# Patient Record
Sex: Female | Born: 1950 | Race: Black or African American | Hispanic: No | Marital: Single | Smoking: Never smoker
Health system: Southern US, Community
[De-identification: ages and names within clinical notes are randomized; demographics above are authoritative.]

## PROBLEM LIST (undated history)

## (undated) DIAGNOSIS — Z9289 Personal history of other medical treatment: Secondary | ICD-10-CM

## (undated) DIAGNOSIS — E119 Type 2 diabetes mellitus without complications: Secondary | ICD-10-CM

## (undated) DIAGNOSIS — A4902 Methicillin resistant Staphylococcus aureus infection, unspecified site: Secondary | ICD-10-CM

## (undated) DIAGNOSIS — I10 Essential (primary) hypertension: Secondary | ICD-10-CM

## (undated) DIAGNOSIS — E785 Hyperlipidemia, unspecified: Secondary | ICD-10-CM

## (undated) DIAGNOSIS — Z8619 Personal history of other infectious and parasitic diseases: Secondary | ICD-10-CM

## (undated) HISTORY — DX: Methicillin resistant Staphylococcus aureus infection, unspecified site: A49.02

## (undated) HISTORY — DX: Personal history of other infectious and parasitic diseases: Z86.19

## (undated) HISTORY — DX: Essential (primary) hypertension: I10

## (undated) HISTORY — DX: Hyperlipidemia, unspecified: E78.5

---

## 1989-01-17 DIAGNOSIS — Z9289 Personal history of other medical treatment: Secondary | ICD-10-CM

## 1989-01-17 HISTORY — DX: Personal history of other medical treatment: Z92.89

## 1989-01-17 HISTORY — PX: LEFT OOPHORECTOMY: SHX1961

## 1989-01-17 HISTORY — PX: ABDOMINAL HYSTERECTOMY: SHX81

## 1998-11-06 ENCOUNTER — Encounter: Admission: RE | Admit: 1998-11-06 | Discharge: 1998-11-06 | Payer: Self-pay | Admitting: Obstetrics and Gynecology

## 1998-11-06 ENCOUNTER — Encounter: Payer: Self-pay | Admitting: Obstetrics and Gynecology

## 2000-02-16 ENCOUNTER — Emergency Department (HOSPITAL_COMMUNITY): Admission: EM | Admit: 2000-02-16 | Discharge: 2000-02-16 | Payer: Self-pay | Admitting: Emergency Medicine

## 2000-02-22 ENCOUNTER — Encounter: Admission: RE | Admit: 2000-02-22 | Discharge: 2000-02-22 | Payer: Self-pay | Admitting: Internal Medicine

## 2000-02-24 ENCOUNTER — Encounter: Admission: RE | Admit: 2000-02-24 | Discharge: 2000-05-24 | Payer: Self-pay

## 2000-03-08 ENCOUNTER — Encounter: Admission: RE | Admit: 2000-03-08 | Discharge: 2000-03-08 | Payer: Self-pay | Admitting: Internal Medicine

## 2000-03-17 ENCOUNTER — Encounter: Admission: RE | Admit: 2000-03-17 | Discharge: 2000-03-17 | Payer: Self-pay | Admitting: Internal Medicine

## 2000-05-22 ENCOUNTER — Encounter: Admission: RE | Admit: 2000-05-22 | Discharge: 2000-05-22 | Payer: Self-pay | Admitting: Internal Medicine

## 2000-09-06 ENCOUNTER — Encounter: Admission: RE | Admit: 2000-09-06 | Discharge: 2000-09-06 | Payer: Self-pay | Admitting: Internal Medicine

## 2000-09-08 ENCOUNTER — Encounter: Admission: RE | Admit: 2000-09-08 | Discharge: 2000-09-08 | Payer: Self-pay | Admitting: Internal Medicine

## 2000-09-08 ENCOUNTER — Encounter: Payer: Self-pay | Admitting: Internal Medicine

## 2000-12-26 ENCOUNTER — Encounter: Admission: RE | Admit: 2000-12-26 | Discharge: 2000-12-26 | Payer: Self-pay | Admitting: Internal Medicine

## 2001-07-26 ENCOUNTER — Encounter: Admission: RE | Admit: 2001-07-26 | Discharge: 2001-07-26 | Payer: Self-pay | Admitting: Internal Medicine

## 2002-03-11 ENCOUNTER — Encounter: Admission: RE | Admit: 2002-03-11 | Discharge: 2002-03-11 | Payer: Self-pay | Admitting: Internal Medicine

## 2002-04-11 ENCOUNTER — Emergency Department (HOSPITAL_COMMUNITY): Admission: EM | Admit: 2002-04-11 | Discharge: 2002-04-11 | Payer: Self-pay | Admitting: Emergency Medicine

## 2002-10-31 ENCOUNTER — Encounter: Admission: RE | Admit: 2002-10-31 | Discharge: 2002-10-31 | Payer: Self-pay | Admitting: Internal Medicine

## 2003-05-20 ENCOUNTER — Encounter: Admission: RE | Admit: 2003-05-20 | Discharge: 2003-05-20 | Payer: Self-pay | Admitting: Internal Medicine

## 2003-05-22 ENCOUNTER — Encounter: Admission: RE | Admit: 2003-05-22 | Discharge: 2003-05-22 | Payer: Self-pay | Admitting: Internal Medicine

## 2003-07-24 ENCOUNTER — Encounter: Admission: RE | Admit: 2003-07-24 | Discharge: 2003-07-24 | Payer: Self-pay | Admitting: Internal Medicine

## 2003-07-31 ENCOUNTER — Encounter: Admission: RE | Admit: 2003-07-31 | Discharge: 2003-07-31 | Payer: Self-pay | Admitting: Internal Medicine

## 2003-08-07 ENCOUNTER — Encounter: Admission: RE | Admit: 2003-08-07 | Discharge: 2003-08-07 | Payer: Self-pay | Admitting: Internal Medicine

## 2003-09-08 ENCOUNTER — Encounter: Admission: RE | Admit: 2003-09-08 | Discharge: 2003-09-08 | Payer: Self-pay | Admitting: Internal Medicine

## 2003-09-23 ENCOUNTER — Ambulatory Visit: Payer: Self-pay | Admitting: Internal Medicine

## 2003-09-30 ENCOUNTER — Ambulatory Visit: Payer: Self-pay | Admitting: Internal Medicine

## 2003-10-30 ENCOUNTER — Ambulatory Visit: Payer: Self-pay | Admitting: Internal Medicine

## 2004-02-03 ENCOUNTER — Ambulatory Visit: Payer: Self-pay | Admitting: Internal Medicine

## 2004-04-08 ENCOUNTER — Inpatient Hospital Stay (HOSPITAL_COMMUNITY): Admission: RE | Admit: 2004-04-08 | Discharge: 2004-04-09 | Payer: Self-pay | Admitting: Internal Medicine

## 2004-04-08 ENCOUNTER — Ambulatory Visit: Payer: Self-pay | Admitting: Internal Medicine

## 2004-04-17 HISTORY — PX: INCISION AND DRAINAGE ABSCESS ANAL: SUR669

## 2004-05-11 ENCOUNTER — Ambulatory Visit: Payer: Self-pay | Admitting: Internal Medicine

## 2004-05-13 ENCOUNTER — Ambulatory Visit: Payer: Self-pay | Admitting: Internal Medicine

## 2004-05-20 ENCOUNTER — Ambulatory Visit: Payer: Self-pay | Admitting: Internal Medicine

## 2004-05-27 ENCOUNTER — Ambulatory Visit: Payer: Self-pay | Admitting: Internal Medicine

## 2004-07-28 ENCOUNTER — Ambulatory Visit: Payer: Self-pay | Admitting: Internal Medicine

## 2004-08-04 ENCOUNTER — Ambulatory Visit: Payer: Self-pay | Admitting: Internal Medicine

## 2005-08-25 ENCOUNTER — Ambulatory Visit: Payer: Self-pay | Admitting: Internal Medicine

## 2005-09-05 ENCOUNTER — Ambulatory Visit: Payer: Self-pay | Admitting: Internal Medicine

## 2005-09-07 ENCOUNTER — Emergency Department (HOSPITAL_COMMUNITY): Admission: EM | Admit: 2005-09-07 | Discharge: 2005-09-08 | Payer: Self-pay | Admitting: Emergency Medicine

## 2005-12-20 DIAGNOSIS — E1165 Type 2 diabetes mellitus with hyperglycemia: Secondary | ICD-10-CM

## 2005-12-20 DIAGNOSIS — IMO0002 Reserved for concepts with insufficient information to code with codable children: Secondary | ICD-10-CM | POA: Insufficient documentation

## 2005-12-20 DIAGNOSIS — Z9079 Acquired absence of other genital organ(s): Secondary | ICD-10-CM | POA: Insufficient documentation

## 2005-12-20 DIAGNOSIS — E119 Type 2 diabetes mellitus without complications: Secondary | ICD-10-CM | POA: Insufficient documentation

## 2006-01-13 DIAGNOSIS — E785 Hyperlipidemia, unspecified: Secondary | ICD-10-CM | POA: Insufficient documentation

## 2006-01-13 DIAGNOSIS — B957 Other staphylococcus as the cause of diseases classified elsewhere: Secondary | ICD-10-CM | POA: Insufficient documentation

## 2006-02-14 ENCOUNTER — Telehealth (INDEPENDENT_AMBULATORY_CARE_PROVIDER_SITE_OTHER): Payer: Self-pay | Admitting: Pharmacy Technician

## 2006-03-15 ENCOUNTER — Ambulatory Visit: Payer: Self-pay | Admitting: Internal Medicine

## 2006-03-15 LAB — CONVERTED CEMR LAB
Glucose, Bld: 206 mg/dL
Hgb A1c MFr Bld: 10.1 %

## 2006-03-16 ENCOUNTER — Telehealth (INDEPENDENT_AMBULATORY_CARE_PROVIDER_SITE_OTHER): Payer: Self-pay | Admitting: *Deleted

## 2006-03-23 ENCOUNTER — Ambulatory Visit: Payer: Self-pay | Admitting: Internal Medicine

## 2006-03-23 ENCOUNTER — Telehealth (INDEPENDENT_AMBULATORY_CARE_PROVIDER_SITE_OTHER): Payer: Self-pay | Admitting: *Deleted

## 2006-05-22 ENCOUNTER — Telehealth (INDEPENDENT_AMBULATORY_CARE_PROVIDER_SITE_OTHER): Payer: Self-pay | Admitting: Pharmacy Technician

## 2006-05-29 ENCOUNTER — Ambulatory Visit: Payer: Self-pay | Admitting: Internal Medicine

## 2006-05-29 LAB — CONVERTED CEMR LAB
Blood Glucose, Fingerstick: 116
Cholesterol, target level: 200 mg/dL
HDL goal, serum: 40 mg/dL
LDL Goal: 100 mg/dL

## 2006-06-28 ENCOUNTER — Encounter (INDEPENDENT_AMBULATORY_CARE_PROVIDER_SITE_OTHER): Payer: Self-pay | Admitting: *Deleted

## 2006-07-17 ENCOUNTER — Telehealth (INDEPENDENT_AMBULATORY_CARE_PROVIDER_SITE_OTHER): Payer: Self-pay | Admitting: Pharmacy Technician

## 2006-07-25 ENCOUNTER — Encounter (INDEPENDENT_AMBULATORY_CARE_PROVIDER_SITE_OTHER): Payer: Self-pay | Admitting: Infectious Diseases

## 2006-08-05 ENCOUNTER — Emergency Department (HOSPITAL_COMMUNITY): Admission: EM | Admit: 2006-08-05 | Discharge: 2006-08-05 | Payer: Self-pay | Admitting: Emergency Medicine

## 2006-08-06 ENCOUNTER — Emergency Department (HOSPITAL_COMMUNITY): Admission: EM | Admit: 2006-08-06 | Discharge: 2006-08-06 | Payer: Self-pay | Admitting: Emergency Medicine

## 2006-08-09 ENCOUNTER — Telehealth: Payer: Self-pay | Admitting: *Deleted

## 2006-08-09 ENCOUNTER — Ambulatory Visit: Payer: Self-pay | Admitting: Hospitalist

## 2006-08-09 LAB — CONVERTED CEMR LAB: Blood Glucose, Fingerstick: 174

## 2006-08-10 ENCOUNTER — Encounter (INDEPENDENT_AMBULATORY_CARE_PROVIDER_SITE_OTHER): Payer: Self-pay | Admitting: Infectious Diseases

## 2006-08-10 LAB — CONVERTED CEMR LAB
ALT: 26 units/L (ref 0–35)
Albumin: 4.1 g/dL (ref 3.5–5.2)
Alkaline Phosphatase: 105 units/L (ref 39–117)
CO2: 23 meq/L (ref 19–32)
Glucose, Bld: 149 mg/dL — ABNORMAL HIGH (ref 70–99)
LDL Cholesterol: 105 mg/dL — ABNORMAL HIGH (ref 0–99)
Microalb Creat Ratio: 2 mg/g (ref 0.0–30.0)
Microalb, Ur: 0.44 mg/dL (ref 0.00–1.89)
Potassium: 4.5 meq/L (ref 3.5–5.3)
Sodium: 139 meq/L (ref 135–145)
Total Bilirubin: 0.2 mg/dL — ABNORMAL LOW (ref 0.3–1.2)
Total Protein: 7.1 g/dL (ref 6.0–8.3)
Triglycerides: 167 mg/dL — ABNORMAL HIGH (ref <150)
VLDL: 33 mg/dL (ref 0–40)

## 2006-08-16 LAB — CONVERTED CEMR LAB: OCCULT 3: NEGATIVE

## 2006-08-17 LAB — CONVERTED CEMR LAB: OCCULT 2: NEGATIVE

## 2006-08-23 ENCOUNTER — Encounter (INDEPENDENT_AMBULATORY_CARE_PROVIDER_SITE_OTHER): Payer: Self-pay | Admitting: Infectious Diseases

## 2006-08-24 ENCOUNTER — Ambulatory Visit: Payer: Self-pay | Admitting: Infectious Disease

## 2006-08-30 ENCOUNTER — Telehealth (INDEPENDENT_AMBULATORY_CARE_PROVIDER_SITE_OTHER): Payer: Self-pay | Admitting: Pharmacy Technician

## 2006-09-11 ENCOUNTER — Telehealth (INDEPENDENT_AMBULATORY_CARE_PROVIDER_SITE_OTHER): Payer: Self-pay | Admitting: Pharmacy Technician

## 2006-09-22 ENCOUNTER — Telehealth (INDEPENDENT_AMBULATORY_CARE_PROVIDER_SITE_OTHER): Payer: Self-pay | Admitting: Pharmacy Technician

## 2006-10-11 ENCOUNTER — Telehealth (INDEPENDENT_AMBULATORY_CARE_PROVIDER_SITE_OTHER): Payer: Self-pay | Admitting: *Deleted

## 2006-12-11 ENCOUNTER — Encounter (INDEPENDENT_AMBULATORY_CARE_PROVIDER_SITE_OTHER): Payer: Self-pay | Admitting: Infectious Diseases

## 2006-12-11 ENCOUNTER — Ambulatory Visit: Payer: Self-pay | Admitting: Internal Medicine

## 2006-12-11 ENCOUNTER — Encounter (INDEPENDENT_AMBULATORY_CARE_PROVIDER_SITE_OTHER): Payer: Self-pay | Admitting: Internal Medicine

## 2006-12-11 LAB — CONVERTED CEMR LAB
Cholesterol: 160 mg/dL (ref 0–200)
LDL Cholesterol: 91 mg/dL (ref 0–99)

## 2007-03-02 ENCOUNTER — Telehealth (INDEPENDENT_AMBULATORY_CARE_PROVIDER_SITE_OTHER): Payer: Self-pay | Admitting: Infectious Diseases

## 2007-03-07 ENCOUNTER — Telehealth (INDEPENDENT_AMBULATORY_CARE_PROVIDER_SITE_OTHER): Payer: Self-pay | Admitting: Infectious Diseases

## 2007-05-14 ENCOUNTER — Telehealth (INDEPENDENT_AMBULATORY_CARE_PROVIDER_SITE_OTHER): Payer: Self-pay | Admitting: Infectious Diseases

## 2007-06-15 ENCOUNTER — Encounter (INDEPENDENT_AMBULATORY_CARE_PROVIDER_SITE_OTHER): Payer: Self-pay | Admitting: Infectious Diseases

## 2007-06-15 ENCOUNTER — Ambulatory Visit: Payer: Self-pay | Admitting: *Deleted

## 2007-06-15 LAB — CONVERTED CEMR LAB
Albumin: 4.1 g/dL (ref 3.5–5.2)
BUN: 25 mg/dL — ABNORMAL HIGH (ref 6–23)
Calcium: 9.7 mg/dL (ref 8.4–10.5)
Chloride: 103 meq/L (ref 96–112)
Glucose, Bld: 214 mg/dL — ABNORMAL HIGH (ref 70–99)
Potassium: 4.1 meq/L (ref 3.5–5.3)

## 2007-06-28 ENCOUNTER — Encounter: Payer: Self-pay | Admitting: Internal Medicine

## 2007-06-28 ENCOUNTER — Ambulatory Visit: Payer: Self-pay | Admitting: Internal Medicine

## 2007-06-28 LAB — CONVERTED CEMR LAB
Blood Glucose, Fingerstick: 105
Insulin/Carbohydrate Ratio: 1

## 2007-06-29 ENCOUNTER — Encounter: Payer: Self-pay | Admitting: Internal Medicine

## 2007-06-29 ENCOUNTER — Ambulatory Visit: Payer: Self-pay | Admitting: Internal Medicine

## 2007-08-06 ENCOUNTER — Telehealth (INDEPENDENT_AMBULATORY_CARE_PROVIDER_SITE_OTHER): Payer: Self-pay | Admitting: Pharmacy Technician

## 2007-09-07 ENCOUNTER — Telehealth: Payer: Self-pay | Admitting: Internal Medicine

## 2007-09-18 ENCOUNTER — Telehealth (INDEPENDENT_AMBULATORY_CARE_PROVIDER_SITE_OTHER): Payer: Self-pay | Admitting: Pharmacy Technician

## 2007-09-25 ENCOUNTER — Telehealth: Payer: Self-pay | Admitting: Internal Medicine

## 2007-12-06 ENCOUNTER — Telehealth: Payer: Self-pay | Admitting: Internal Medicine

## 2007-12-17 ENCOUNTER — Ambulatory Visit: Payer: Self-pay | Admitting: Infectious Diseases

## 2007-12-17 ENCOUNTER — Encounter (INDEPENDENT_AMBULATORY_CARE_PROVIDER_SITE_OTHER): Payer: Self-pay | Admitting: *Deleted

## 2007-12-17 DIAGNOSIS — I10 Essential (primary) hypertension: Secondary | ICD-10-CM

## 2007-12-17 DIAGNOSIS — I152 Hypertension secondary to endocrine disorders: Secondary | ICD-10-CM | POA: Insufficient documentation

## 2007-12-17 DIAGNOSIS — E1159 Type 2 diabetes mellitus with other circulatory complications: Secondary | ICD-10-CM

## 2007-12-17 LAB — CONVERTED CEMR LAB
Albumin: 3.7 g/dL (ref 3.5–5.2)
Alkaline Phosphatase: 96 units/L (ref 39–117)
BUN: 18 mg/dL (ref 6–23)
CO2: 24 meq/L (ref 19–32)
Cholesterol: 165 mg/dL (ref 0–200)
Glucose, Bld: 297 mg/dL — ABNORMAL HIGH (ref 70–99)
HDL: 43 mg/dL (ref >39)
LDL Cholesterol: 90 mg/dL (ref 0–99)
Potassium: 4.3 meq/L (ref 3.5–5.3)
Total Bilirubin: 0.3 mg/dL (ref 0.3–1.2)
Triglycerides: 162 mg/dL — ABNORMAL HIGH (ref <150)

## 2008-02-07 ENCOUNTER — Ambulatory Visit: Payer: Self-pay | Admitting: Internal Medicine

## 2008-02-07 ENCOUNTER — Encounter: Payer: Self-pay | Admitting: Internal Medicine

## 2008-02-20 ENCOUNTER — Telehealth: Payer: Self-pay | Admitting: Internal Medicine

## 2008-04-29 ENCOUNTER — Telehealth: Payer: Self-pay | Admitting: Internal Medicine

## 2008-06-09 ENCOUNTER — Encounter: Payer: Self-pay | Admitting: Internal Medicine

## 2008-06-09 ENCOUNTER — Ambulatory Visit: Payer: Self-pay | Admitting: *Deleted

## 2008-06-10 ENCOUNTER — Encounter: Payer: Self-pay | Admitting: Internal Medicine

## 2008-06-13 ENCOUNTER — Telehealth (INDEPENDENT_AMBULATORY_CARE_PROVIDER_SITE_OTHER): Payer: Self-pay | Admitting: Pharmacy Technician

## 2008-09-02 ENCOUNTER — Telehealth: Payer: Self-pay | Admitting: Internal Medicine

## 2008-11-12 ENCOUNTER — Telehealth: Payer: Self-pay | Admitting: Internal Medicine

## 2008-11-17 ENCOUNTER — Encounter: Payer: Self-pay | Admitting: Internal Medicine

## 2008-11-18 ENCOUNTER — Ambulatory Visit: Payer: Self-pay | Admitting: Infectious Disease

## 2008-11-18 DIAGNOSIS — S025XXA Fracture of tooth (traumatic), initial encounter for closed fracture: Secondary | ICD-10-CM | POA: Insufficient documentation

## 2008-11-18 LAB — CONVERTED CEMR LAB: Hgb A1c MFr Bld: 10.5 %

## 2009-01-19 ENCOUNTER — Telehealth (INDEPENDENT_AMBULATORY_CARE_PROVIDER_SITE_OTHER): Payer: Self-pay | Admitting: *Deleted

## 2009-01-19 ENCOUNTER — Telehealth: Payer: Self-pay | Admitting: Internal Medicine

## 2009-01-21 ENCOUNTER — Ambulatory Visit: Payer: Self-pay | Admitting: Internal Medicine

## 2009-01-22 ENCOUNTER — Encounter (INDEPENDENT_AMBULATORY_CARE_PROVIDER_SITE_OTHER): Payer: Self-pay | Admitting: Dermatology

## 2009-01-22 LAB — CONVERTED CEMR LAB
ALT: 13 units/L (ref 0–35)
AST: 13 units/L (ref 0–37)
Albumin: 4.2 g/dL (ref 3.5–5.2)
Alkaline Phosphatase: 95 units/L (ref 39–117)
BUN: 23 mg/dL (ref 6–23)
Calcium: 9.8 mg/dL (ref 8.4–10.5)
Chloride: 105 meq/L (ref 96–112)
Potassium: 4.2 meq/L (ref 3.5–5.3)
Sodium: 143 meq/L (ref 135–145)
Total Protein: 7 g/dL (ref 6.0–8.3)

## 2009-02-19 ENCOUNTER — Telehealth: Payer: Self-pay | Admitting: Internal Medicine

## 2009-02-25 ENCOUNTER — Ambulatory Visit: Payer: Self-pay | Admitting: Internal Medicine

## 2009-02-25 LAB — CONVERTED CEMR LAB
Alkaline Phosphatase: 108 units/L (ref 39–117)
BUN: 19 mg/dL (ref 6–23)
CO2: 26 meq/L (ref 19–32)
Cholesterol: 172 mg/dL (ref 0–200)
Creatinine, Ser: 0.86 mg/dL (ref 0.40–1.20)
Glucose, Bld: 312 mg/dL — ABNORMAL HIGH (ref 70–99)
HDL: 47 mg/dL (ref >39)
LDL Cholesterol: 108 mg/dL — ABNORMAL HIGH (ref 0–99)
Sodium: 141 meq/L (ref 135–145)
Total Bilirubin: 0.3 mg/dL (ref 0.3–1.2)
Total CHOL/HDL Ratio: 3.7
Triglycerides: 86 mg/dL (ref <150)
VLDL: 17 mg/dL (ref 0–40)

## 2009-03-05 ENCOUNTER — Telehealth: Payer: Self-pay | Admitting: Internal Medicine

## 2009-03-12 ENCOUNTER — Ambulatory Visit (HOSPITAL_COMMUNITY): Admission: RE | Admit: 2009-03-12 | Discharge: 2009-03-12 | Payer: Self-pay | Admitting: Internal Medicine

## 2009-03-12 LAB — HM MAMMOGRAPHY: HM Mammogram: NEGATIVE

## 2009-03-26 ENCOUNTER — Telehealth: Payer: Self-pay | Admitting: *Deleted

## 2009-06-16 ENCOUNTER — Telehealth: Payer: Self-pay | Admitting: Internal Medicine

## 2009-06-24 ENCOUNTER — Encounter: Payer: Self-pay | Admitting: Internal Medicine

## 2009-06-24 ENCOUNTER — Ambulatory Visit: Payer: Self-pay | Admitting: Internal Medicine

## 2009-06-30 ENCOUNTER — Telehealth: Payer: Self-pay | Admitting: Licensed Clinical Social Worker

## 2009-07-06 ENCOUNTER — Encounter: Payer: Self-pay | Admitting: Internal Medicine

## 2009-07-06 LAB — HM DIABETES EYE EXAM

## 2009-07-08 ENCOUNTER — Telehealth: Payer: Self-pay | Admitting: Licensed Clinical Social Worker

## 2009-07-13 ENCOUNTER — Encounter: Payer: Self-pay | Admitting: Licensed Clinical Social Worker

## 2009-08-25 ENCOUNTER — Ambulatory Visit: Payer: Self-pay | Admitting: Internal Medicine

## 2009-11-18 ENCOUNTER — Telehealth: Payer: Self-pay | Admitting: Internal Medicine

## 2009-11-19 ENCOUNTER — Telehealth: Payer: Self-pay | Admitting: Internal Medicine

## 2009-11-19 ENCOUNTER — Telehealth: Payer: Self-pay | Admitting: *Deleted

## 2009-11-19 ENCOUNTER — Encounter: Payer: Self-pay | Admitting: Internal Medicine

## 2009-12-01 ENCOUNTER — Telehealth: Payer: Self-pay | Admitting: Internal Medicine

## 2009-12-17 ENCOUNTER — Telehealth: Payer: Self-pay | Admitting: Internal Medicine

## 2009-12-21 ENCOUNTER — Ambulatory Visit: Payer: Self-pay | Admitting: Internal Medicine

## 2009-12-21 LAB — CONVERTED CEMR LAB
BUN: 14 mg/dL (ref 6–23)
CO2: 29 meq/L (ref 19–32)
Chloride: 104 meq/L (ref 96–112)
Creatinine, Ser: 1.15 mg/dL (ref 0.40–1.20)
Creatinine, Urine: 169.7 mg/dL
Hgb A1c MFr Bld: 9.9 %
Microalb Creat Ratio: 2.9 mg/g (ref 0.0–30.0)
Potassium: 4.1 meq/L (ref 3.5–5.3)

## 2009-12-29 ENCOUNTER — Encounter (INDEPENDENT_AMBULATORY_CARE_PROVIDER_SITE_OTHER): Payer: Self-pay | Admitting: *Deleted

## 2009-12-29 ENCOUNTER — Ambulatory Visit: Payer: Self-pay | Admitting: Internal Medicine

## 2010-01-12 ENCOUNTER — Ambulatory Visit
Admission: RE | Admit: 2010-01-12 | Discharge: 2010-01-12 | Payer: Self-pay | Source: Home / Self Care | Attending: Internal Medicine | Admitting: Internal Medicine

## 2010-01-12 ENCOUNTER — Ambulatory Visit: Payer: Self-pay | Admitting: Internal Medicine

## 2010-01-12 LAB — HM COLONOSCOPY

## 2010-01-12 LAB — CONVERTED CEMR LAB: Glucose, Bld: 379 mg/dL — ABNORMAL HIGH (ref 70–99)

## 2010-01-13 ENCOUNTER — Encounter: Payer: Self-pay | Admitting: Internal Medicine

## 2010-02-16 NOTE — Letter (Signed)
Summary: LETTER  GYM   LETTER  GYM   Imported By: Margie Billet 07/02/2009 10:30:03  _____________________________________________________________________  External Attachment:    Type:   Image     Comment:   External Document

## 2010-02-16 NOTE — Progress Notes (Signed)
Summary: REfill/gh  Phone Note Refill Request Message from:  Fax from Pharmacy on February 19, 2009 10:56 AM  Refills Requested: Medication #1:  RELION 70/30 70-30 % SUSP use 36 Units each morning before breakfast and 26 Units each evening before dinner.   Last Refilled: 01/19/2009  Method Requested: Electronic Initial call taken by: Angelina Ok RN,  February 19, 2009 10:57 AM  Follow-up for Phone Call        Rx faxed to pharmacy Follow-up by: Mariea Stable MD,  February 19, 2009 1:26 PM    Prescriptions: RELION 70/30 70-30 % SUSP (INSULIN ISOPHANE & REGULAR) use 36 Units each morning before breakfast and 26 Units each evening before dinner.  #2 vials x 3   Entered and Authorized by:   Mariea Stable MD   Signed by:   Mariea Stable MD on 02/19/2009   Method used:   [id number]       [address]       [place], [place]  [address]       Ph: [phone]       Fax: [phone]   RxID:   [id number]

## 2010-02-16 NOTE — Letter (Signed)
Summary: Pre Visit Letter Revised  [facility]  [address] [place], [place] [address]   Phone: [phone]  Fax: [phone]        11/19/2009 MRN: [id number]  Mandy Mullen [address] [place], [place]  [address]             Procedure Date:  12-27 at 8:30am   Welcome to the Gastroenterology Division at [facility].    You are scheduled to see a nurse for your pre-procedure visit on 12-29-09 at 1pm on the 3rd floor at [facility], [address]  We ask that you try to arrive at our office 15 minutes prior to your appointment time to allow for check-in.  Please take a minute to review the attached form.  If you answer "Yes" to one or more of the questions on the first page, we ask that you call the person listed at your earliest opportunity.  If you answer "No" to all of the questions, please complete the rest of the form and bring it to your appointment.    Your nurse visit will consist of discussing your medical and surgical history, your immediate family medical history, and your medications.   If you are unable to list all of your medications on the form, please bring the medication bottles to your appointment and we will list them.  We will need to be aware of both prescribed and over the counter drugs.  We will need to know exact dosage information as well.    Please be prepared to read and sign documents such as consent forms, a financial agreement, and acknowledgement forms.  If necessary, and with your consent, a friend or relative is welcome to sit-in on the nurse visit with you.  Please bring your insurance card so that we may make a copy of it.  If your insurance requires a referral to see a specialist, please bring your referral form from your primary care physician.  No co-pay is required for this nurse visit.     If you cannot keep your appointment, please call [phone] to cancel or reschedule prior to your appointment date.  This  allows [name] the opportunity to schedule an appointment for another patient in need of care.    Thank you for choosing  Gastroenterology for your medical needs.  We appreciate the opportunity to care for you.  Please visit [name] at our website  to learn more about our practice.  Sincerely, The Gastroenterology Division

## 2010-02-16 NOTE — Progress Notes (Signed)
Summary: Soc. Work  Programme researcher, broadcasting/film/video of Call: Appmt on Monday June 27th at 10:00 with Jola Babinski.

## 2010-02-16 NOTE — Progress Notes (Signed)
Summary: Dental Referral  Phone Note Outgoing Call   Call placed by: Angelina Ok RN,  March 26, 2009 11:23 AM Call placed to: Patient Summary of Call: Call to pt. York Spaniel that she did not go to the [facility] as planned.  Still would like to be referred to the Dental Clinic for her broken teeth and pain.  Referral information faxed to the Dental Clinic. Angelina Ok RN  March 26, 2009 11:25 AM  Initial call taken by: Angelina Ok RN,  March 26, 2009 11:25 AM

## 2010-02-16 NOTE — Progress Notes (Signed)
  Phone Note Outgoing Call   Call placed by: Theotis Barrio NT II,  November 19, 2009 12:59 PM Call placed to: Patient Details for Reason: [facility] APPT Summary of Call: LEFT VOICE MESSAGE FOR PATIENT TO CALL [facility] / [facility]=DR GESSNER  - [phone] NURSE VISIT- DEC 13, 011 @ 1:00PM (12:45)  /  PROCEDURE- DEC 27, 011 @ 8:30AM -ARRIVAL 7:30AM  / [address]

## 2010-02-16 NOTE — Progress Notes (Signed)
Summary: refill/gg  Phone Note Refill Request  on March 05, 2009 12:24 PM  Refills Requested: Medication #1:  GLUCOPHAGE 1000 MG TABS Take 1 tablet by mouth two times a day   Last Refilled: 01/17/2009  Method Requested: Electronic Initial call taken by: Merrie Roof RN,  March 05, 2009 12:24 PM  Follow-up for Phone Call        Rx faxed to pharmacy Follow-up by: Mariea Stable MD,  March 06, 2009 6:51 AM    Prescriptions: GLUCOPHAGE 1000 MG TABS (METFORMIN HCL) Take 1 tablet by mouth two times a day  #60 x 3   Entered and Authorized by:   Mariea Stable MD   Signed by:   Mariea Stable MD on 03/06/2009   Method used:   Electronically to        [facility] [phone]* (retail)       [address]       [place], [place]  [address]       Ph: [phone]       Fax: [phone]   RxID:   [id number]

## 2010-02-16 NOTE — Progress Notes (Signed)
Summary: refill/ hla  Phone Note Refill Request Message from:  Fax from Pharmacy on December 01, 2009 2:21 PM  Refills Requested: Medication #1:  GLUCOPHAGE 1000 MG TABS Take 1 tablet by mouth two times a day   Dosage confirmed as above?Dosage Confirmed   Last Refilled: 9/12 last visit 06/2009, last labs 02/2009  Initial call taken by: Marin Roberts RN,  December 01, 2009 2:22 PM  Follow-up for Phone Call        Rx faxed to pharmacy.    Pt needs to be seen as she has a h/o of not arriving to scheduled appointments and diabetes remains uncontrolled. Follow-up by: Mariea Stable MD,  December 02, 2009 2:55 PM    Prescriptions: GLUCOPHAGE 1000 MG TABS (METFORMIN HCL) Take 1 tablet by mouth two times a day  #60 x 1   Entered and Authorized by:   Mariea Stable MD   Signed by:   Mariea Stable MD on 12/02/2009   Method used:   Electronically to        [facility] [phone]* (retail)       [address]       [place], [place]  [address]       Ph: [phone]       Fax: [phone]   RxID:   [id number]

## 2010-02-16 NOTE — Assessment & Plan Note (Signed)
Summary: RECK/FASTING LABS/ALVAREZ/VS   Vital Signs:  Patient profile:   60 year old female Height:      63 inches (160.02 cm) Weight:      192.3 pounds (87.41 kg) BMI:     34.19 Temp:     97.3 degrees F (36.28 degrees C) oral Pulse rate:   67 / minute BP sitting:   138 / 89  (right arm)  Vitals Entered By: Stanton Kidney Ditzler RN (February 25, 2009 9:59 AM) Is Patient Diabetic? Yes Did you bring your meter with you today? No Pain Assessment Patient in pain? no      Nutritional Status BMI of > 30 = obese Nutritional Status Detail appetite good  Have you ever been in a relationship where you felt threatened, hurt or afraid?denies   Does patient need assistance? Functional Status Self care Ambulation Normal Comments FU and ck right big toe.   Primary Care Provider:  Ronda Fairly MD   History of Present Illness: 60 yo woman with PMH as listed on the EMR but significant for HTN, DM and hyperlipidemia who presents to clinic for followup of her chronic problems.  Patient denies any CP, SOB, nausea, vomiting, abdominal pain, fever, chills, dysuria, diarrhea, or any other complaints.  Patient reports to be compliant with her medications and insulin shots, but unfortunely she has been using lantus 20 units daily for the last 6-7 weeks due to error at the pharmacy and the fact that there were no more refills for her 70/30. Patient is not following a low carb diet and has been recently more sedentary than before (most likely due to the weather condition).  CBG in the office was 313 and her A1C today 10.6  Depression History:      The patient denies a depressed mood most of the day and a diminished interest in her usual daily activities.        The patient denies that she feels like life is not worth living, denies that she wishes that she were dead, and denies that she has thought about ending her life.         Preventive Screening-Counseling & Management  Alcohol-Tobacco  Smoking Status: never  Caffeine-Diet-Exercise     Does Patient Exercise: yes     Type of exercise: WALKING     Times/week: AT TIMES  Problems Prior to Update: 1)  Fractured Tooth  (ICD-873.63) 2)  Preventive Health Care  (ICD-V70.0) 3)  Hypertension  (ICD-401.9) 4)  Hyperlipidemia  (ICD-272.4) 5)  Mrsa Infection  (ICD-041.19) 6)  Total Abdominal Hysterectomy, Hx of  (ICD-V45.77) 7)  Diabetes Mellitus, Type II  (ICD-250.00)  Current Problems (verified): 1)  Fractured Tooth  (ICD-873.63) 2)  Preventive Health Care  (ICD-V70.0) 3)  Hypertension  (ICD-401.9) 4)  Hyperlipidemia  (ICD-272.4) 5)  Mrsa Infection  (ICD-041.19) 6)  Total Abdominal Hysterectomy, Hx of  (ICD-V45.77) 7)  Diabetes Mellitus, Type II  (ICD-250.00)  Medications Prior to Update: 1)  Pravachol 40 Mg Tabs (Pravastatin Sodium) .... Take 1 Tablet By Mouth Once A Day 2)  Freestyle Lite  Strp (Glucose Blood) 3)  Bd Ultra-Fine Lancets  Misc (Lancets) 4)  Glucophage 1000 Mg Tabs (Metformin Hcl) .... Take 1 Tablet By Mouth Two Times A Day 5)  Lisinopril 40 Mg Tabs (Lisinopril) .... Take 1 Tablet By Mouth Once A Day 6)  Relion 70/30 70-30 % Susp (Insulin Isophane & Regular) .... Use 36 Units Each Morning Before Breakfast and 26 Units Each Evening Before  Dinner. 7)  Hydrochlorothiazide 25 Mg Tabs (Hydrochlorothiazide) .... Take 1 Tablet By Mouth Once A Day  Current Medications (verified): 1)  Pravachol 40 Mg Tabs (Pravastatin Sodium) .... Take 1 Tablet By Mouth Once A Day 2)  Freestyle Lite  Strp (Glucose Blood) 3)  Bd Ultra-Fine Lancets  Misc (Lancets) 4)  Glucophage 1000 Mg Tabs (Metformin Hcl) .... Take 1 Tablet By Mouth Two Times A Day 5)  Lisinopril 40 Mg Tabs (Lisinopril) .... Take 1 Tablet By Mouth Once A Day 6)  Relion 70/30 70-30 % Susp (Insulin Isophane & Regular) .... Use 36 Units Each Morning Before Breakfast and 26 Units Each Evening Before Dinner. 7)  Hydrochlorothiazide 25 Mg Tabs (Hydrochlorothiazide)  .... Take 1 Tablet By Mouth Once A Day  Allergies: 1)  * Eggs  Past History:  Past Medical History: Last updated: 12/17/2007 Diabetes mellitus, type II Hypertension Recurrent MRSA abscesses Hyperlipidemia Hx of genital herpes Hx of L eye conjunctivitis  Past Surgical History: Last updated: 12/20/2005 S/P I and D for Gluteal abscess 4/06 Total abdominal hysterectomy 1991  Family History: Last updated: 12/11/2006 Family History of CAD Female 1st degree relative <50 (father) Family History Diabetes 1st degree relative (sister) Family History of Stroke F 1st degree relative <60 (sister)  Social History: Last updated: 12/11/2006 Occupation: Works at [employer] Single Never Smoked Alcohol use-no Drug use-no Regular exercise-yes- trying goes to the [employer]  Risk Factors: Exercise: yes (02/25/2009)  Risk Factors: Smoking Status: never (02/25/2009)  Review of Systems       As per HPI.  Physical Exam  General:  alert, well-developed, well-nourished, and well-hydrated.   Lungs:  normal respiratory effort, normal breath sounds, no crackles, and no wheezes.   Heart:  normal rate, regular rhythm, no murmur, no gallop, and no rub.   Abdomen:  soft, non-tender, and normal bowel sounds.   Extremities:  No edema, good pulses bilaterally and no ulcers or calluses appreciated. Neurologic:  alert & oriented X3, cranial nerves II-XII intact, and strength normal in all extremities.     Impression & Recommendations:  Problem # 1:  HYPERTENSION (ICD-401.9) BP is essentially at goal for a diabetic patient. Patient will continue current regimen of HCTZ and Lsinopril and will encourage her to follow a low sodium diet. Will check renal function and electrolytes.  Her updated medication list for this problem includes:    Lisinopril 40 Mg Tabs (Lisinopril) .Marland Kitchen... Take 1 tablet by mouth once a day    Hydrochlorothiazide 25 Mg Tabs (Hydrochlorothiazide) .Marland Kitchen... Take  1 tablet by mouth once a day  Problem # 2:  HYPERLIPIDEMIA (ICD-272.4) Patient has been using pravachol 40mg  daily to control her hyperlipidemia. We have checked her lipid profile today, which demonstrated an LDL of 108 and we also checked her LFT's Which were WNL. Will advised to follow a low fat diet and will increased her pravachol to 80mg  daily.  Her updated medication list for this problem includes:    Pravachol 40 Mg Tabs (Pravastatin sodium) .Marland Kitchen... Take 2 tablet by mouth once a day  Orders: T-Lipid Profile ([id number])  Problem # 3:  DIABETES MELLITUS, TYPE II (ICD-250.00) Patient A1C 10.6; will increased her insulin to 42 units in the AM and 30 units at evening time; she has been advised to follow a low carbohydrates diet (less than 65 grams per day) and will also start glimepiride 2mg  once aday with main meal.  Her updated medication list for this problem includes:  Glucophage 1000 Mg Tabs (Metformin hcl) .Marland Kitchen... Take 1 tablet by mouth two times a day    Lisinopril 40 Mg Tabs (Lisinopril) .Marland Kitchen... Take 1 tablet by mouth once a day    Relion 70/30 70-30 % Susp (Insulin isophane & regular) ..... Use 42 units each morning before breakfast and 30 units each evening before dinner.    Glimepiride 2 Mg Tabs (Glimepiride) .Marland Kitchen... Take 1 tab by mouth daily with main meal.  Orders: T- Capillary Blood Glucose (11914) T-Hgb A1C (in-house) ([id number]) Ophthalmology Referral (Ophthalmology)  Labs Reviewed: Creat: 1.08 (01/22/2009)    Reviewed HgBA1c results: 10.6 (02/25/2009)  10.5 (11/18/2008)  Problem # 4:  Preventive Health Care (ICD-V70.0) Patient has her mammogram appoinment arranged and will also scheduled an ophtalmology visit in order to screen for retinopathy.  Complete Medication List: 1)  Pravachol 40 Mg Tabs (Pravastatin sodium) .... Take 2 tablet by mouth once a day 2)  Freestyle Lite Strp (Glucose blood) 3)  Bd Ultra-fine Lancets Misc (Lancets) 4)  Glucophage 1000 Mg Tabs  (Metformin hcl) .... Take 1 tablet by mouth two times a day 5)  Lisinopril 40 Mg Tabs (Lisinopril) .... Take 1 tablet by mouth once a day 6)  Relion 70/30 70-30 % Susp (Insulin isophane & regular) .... Use 42 units each morning before breakfast and 30 units each evening before dinner. 7)  Hydrochlorothiazide 25 Mg Tabs (Hydrochlorothiazide) .... Take 1 tablet by mouth once a day 8)  Glimepiride 2 Mg Tabs (Glimepiride) .... Take 1 tab by mouth daily with main meal.  Other Orders: T-Comprehensive Metabolic Panel ([id number]) Mammogram (Screening) (Mammo)  Patient Instructions: 1)  Take your medications as prescribed. 2)  Follow a low sodium diet (less than to 2G of sodium daily). 3)  Follow a low carbohydrates diet (less than 65mg  daily). 4)  Check your blood sugar twice a day and be compliant with your insulin as prescribed. (donot skip meals) 5)  Check your feet each night for sore areas, calluses or signs of infection. 6)  Please schedule a follow-up appointment in 2 months. 7)  You will be called with any abnormalities in the tests scheduled or performed today.  If you don't hear from [name] within a week from when the test was performed, you can assume that your test was normal. Prescriptions: PRAVACHOL 40 MG TABS (PRAVASTATIN SODIUM) Take 2 tablet by mouth once a day  #62 x 5   Entered and Authorized by:   Vassie Loll MD   Signed by:   Vassie Loll MD on 03/02/2009   Method used:   Electronically to        [facility] [phone]* (retail)       [address]       [place], [place]  [address]       Ph: [phone]       Fax: [phone]   RxID:   [id number] GLIMEPIRIDE 2 MG TABS (GLIMEPIRIDE) Take 1 tab by mouth daily with main meal.  #31 x 2   Entered and Authorized by:   Vassie Loll MD   Signed by:   Vassie Loll MD on 02/25/2009   Method used:   [facility]        [facility] [address] [phone][facility] (retail)       [address]       [place], [place]  [address]       Ph: [phone]  Fax: [phone]   RxID:   [id number] RELION 70/30 70-30 % SUSP (INSULIN ISOPHANE & REGULAR) use 42 Units each morning before breakfast and 30 Units each evening before dinner.  #4 vials x 5   Entered and Authorized by:   Vassie Loll MD   Signed by:   Vassie Loll MD on 02/25/2009   Method used:   [facility]        [facility] [address] [phone]* (retail)       [address]       [place], [place]  [address]       Ph: [phone]       Fax: [phone]   RxID:   [id number]  Process Orders Check Orders Results:     [facility]: ABN not required for this insurance Tests Sent for requisitioning (March 02, 2009 9:42 AM):     02/25/2009: [facility] -- T-Lipid Profile [id number] (signed)     02/25/2009: [facility] -- T-Comprehensive Metabolic Panel [id number] (signed)    Prevention & Chronic Care Immunizations   Influenza vaccine: Not documented   Influenza vaccine deferral: Deferred  (01/21/2009)    Tetanus booster: Not documented   Td booster deferral: Deferred  (01/21/2009)    Pneumococcal vaccine: Pneumovax  (02/07/2008)  Colorectal Screening   Hemoccult: Not documented    Colonoscopy: Not documented   Colonoscopy action/deferral: Deferred  (01/21/2009)  Other Screening   Pap smear: Not documented   Pap smear action/deferral: Deferred  (01/21/2009)    Mammogram: Normal  (05/21/2004)   Mammogram action/deferral: Ordered  (02/25/2009)  Reports requested:   Last colonoscopy report requested.  Smoking status: never  (02/25/2009)  Diabetes Mellitus   HgbA1C: 10.6  (02/25/2009)   Hemoglobin A1C due: 06/13/2006    Eye exam: Not documented   Diabetic eye exam action/deferral: Ophthalmology referral  (02/25/2009)    Foot exam: yes  (02/07/2008)   High risk foot: No  (02/07/2008)   Foot care education: Not documented    Foot exam due: 05/29/2007    Urine microalbumin/creatinine ratio: 2.2  (06/15/2007)   Urine microalbumin/cr due: 09/06/2006    Diabetes flowsheet reviewed?: Yes   Progress toward A1C goal: Unchanged  Lipids   Total Cholesterol: 165  (12/17/2007)   Lipid panel action/deferral: Lipid Panel ordered   LDL: 90  (12/17/2007)   LDL Direct: Not documented   HDL: 43  (12/17/2007)   Triglycerides: 162  (12/17/2007)   Lipid panel due: 09/06/2006    SGOT (AST): 13  (01/22/2009)   BMP action: Ordered   SGPT (ALT): 13  (01/22/2009) CMP ordered    Alkaline phosphatase: 95  (01/22/2009)   Total bilirubin: 0.3  (01/22/2009)    Lipid flowsheet reviewed?: Yes   Progress toward LDL goal: Improved  Hypertension   Last Blood Pressure: 138 / 89  (02/25/2009)   Serum creatinine: 1.08  (01/22/2009)   BMP action: Ordered   Serum potassium 4.2  (01/22/2009) CMP ordered     Hypertension flowsheet reviewed?: Yes   Progress toward BP goal: Improved  Self-Management Support :   Personal Goals (by the next clinic visit) :     Personal A1C goal: 9  (02/25/2009)     Personal blood pressure goal: 130/80  (02/25/2009)     Personal LDL goal: 70  (02/25/2009)    Patient will work on the following items until the next clinic visit to reach self-care goals:  Medications and monitoring: take my medicines every day, check my blood sugar, bring all of my medications to every visit, examine my feet every day  (02/25/2009)     Eating: drink diet soda or water instead of juice or soda, eat more vegetables, use fresh or frozen vegetables, eat foods that are low in salt, eat baked foods instead of fried foods, eat fruit for snacks and desserts, limit or avoid alcohol  (02/25/2009)     Activity: take a 30 minute walk every day, park at the far end of the parking lot  (02/25/2009)     Other: checks blood sugar daily AM  (11/18/2008)     Home glucose monitoring frequency: 2 times a day  (02/25/2009)    Diabetes  self-management support: Written self-care plan  (02/25/2009)   Diabetes care plan printed   Last diabetes self-management training by diabetes educator: 02/07/2008   Last medical nutrition therapy: 02/07/2008    Hypertension self-management support: Written self-care plan  (02/25/2009)   Hypertension self-care plan printed.    Lipid self-management support: Written self-care plan  (02/25/2009)   Lipid self-care plan printed.   Nursing Instructions: Request report of last colonoscopy Schedule screening mammogram (see order) Refer for screening diabetic eye exam (see order)   Laboratory Results   Blood Tests   Date/Time Received: February 25, 2009 10:22 AM  Date/Time Reported: Burke Keels  February 25, 2009 10:22 AM   HGBA1C: 10.6%   (Normal Range: Non-Diabetic - 3-6%   Control Diabetic - 6-8%) CBG Fasting:: 313mg /dL

## 2010-02-16 NOTE — Consult Note (Signed)
Summary: EYE  [facility]  EYE  [facility]   Imported By: Margie Billet 07/15/2009 16:10:37  _____________________________________________________________________  External Attachment:    Type:   Image     Comment:   External Document  Appended Document: EYE  [facility]    Clinical Lists Changes  Observations: Added new observation of DIAB EYE EX: Visual acuity OD (best corrected):     20/20 Visual acuity OS (best corrected):     20/20 Intraocular pressure OD:     18 Intraocular pressure OS:     20  Cataract.    OS:  diabetic retinopathy with dot/blot hem   (07/06/2009 8:29)       Diabetic Eye Exam  Procedure date:  07/06/2009  Findings:      Visual acuity OD (best corrected):     20/20 Visual acuity OS (best corrected):     20/20 Intraocular pressure OD:     18 Intraocular pressure OS:     20  Cataract.    OS:  diabetic retinopathy with dot/blot hem    Comments:      Follow up 6 weeks   Diabetic Eye Exam  Procedure date:  07/06/2009  Findings:      Visual acuity OD (best corrected):     20/20 Visual acuity OS (best corrected):     20/20 Intraocular pressure OD:     18 Intraocular pressure OS:     20  Cataract.    OS:  diabetic retinopathy with dot/blot hem    Comments:      Follow up 6 weeks

## 2010-02-16 NOTE — Progress Notes (Signed)
Summary: refill/gg  Phone Note Refill Request  on November 19, 2009 3:25 PM  Refills Requested: Medication #1:  LISINOPRIL 40 MG TABS Take 1 tablet by mouth once a day  Medication #2:  RELION 70/30 70-30 % SUSP use 36 Units each morning before breakfast and 30 Units each evening before dinner. Last visit 6 / 11    was to have a 1 month f/u that was not done. # [id number]   Method Requested: Electronic Initial call taken by: Merrie Roof RN,  November 19, 2009 3:26 PM  Follow-up for Phone Call        Rx faxed to pharmacy 1 month supply, needs to be seen. Follow-up by: Mariea Stable MD,  November 25, 2009 2:52 PM  Additional Follow-up for Phone Call Additional follow up Details #1::        flag sent to [place] for appointment Additional Follow-up by: Merrie Roof RN,  November 27, 2009 2:03 PM    Prescriptions: RELION 70/30 70-30 % SUSP (INSULIN ISOPHANE & REGULAR) use 36 Units each morning before breakfast and 30 Units each evening before dinner.  #2 vials x 0   Entered and Authorized by:   Mariea Stable MD   Signed by:   Mariea Stable MD on 11/25/2009   Method used:   [facility] [address] [phone]* (retail)       [address]       [place], [place]  [address]       Ph: [phone]       Fax: [phone]   RxID:   [id number] LISINOPRIL 40 MG TABS (LISINOPRIL) Take 1 tablet by mouth once a day  #30 x 0   Entered and Authorized by:   Mariea Stable MD   Signed by:   Mariea Stable MD on 11/25/2009   Method used:   [facility] [phone]* (retail)       [address]       [place], [place]  [address]       Ph: [phone]       Fax: [phone]   RxID:   [id number]

## 2010-02-16 NOTE — Progress Notes (Signed)
  Phone Note Call from Patient   Caller: Patient Call For: [name] Reason for Call: Refill Medication, Referral Summary of Call: PATIENT CALLED ME BACK AND I GAVE HER THE GI REFERRAL APPT WITH  [facility]. ALSO PATIENT STATES SHE HAD CALLTHE DRUG STORE TO REFILL HER INSULIN AND LISINOPRIL, I TOLD PATIENT THAT SHE MIGHT NEED TO COME IN FOR APPT, BUT SHOULD BE ABLE TO GET ENOUGH TO LAST UNTIL THE APPT.  GAVE GAYLE THE INFO TO CALL THE PATIENT BACK

## 2010-02-16 NOTE — Progress Notes (Signed)
Summary: med refill/gp  Phone Note Refill Request Message from:  Fax from Pharmacy on Jun 16, 2009 2:23 PM  Refills Requested: Medication #1:  GLIMEPIRIDE 2 MG TABS Take 1 tab by mouth daily with main meal..   Last Refilled: 05/04/2009  Method Requested: Electronic Initial call taken by: Chinita Pester RN,  Jun 16, 2009 2:23 PM  Follow-up for Phone Call        Rx faxed to pharmacy Follow-up by: Mariea Stable MD,  June 17, 2009 8:04 AM    Prescriptions: GLIMEPIRIDE 2 MG TABS (GLIMEPIRIDE) Take 1 tab by mouth daily with main meal.  #31 x 6   Entered and Authorized by:   Mariea Stable MD   Signed by:   Mariea Stable MD on 06/17/2009   Method used:   Electronically to        [facility] [phone]* (retail)       [address]       [place], [place]  [address]       Ph: [phone]       Fax: [phone]   RxID:   [id number]

## 2010-02-16 NOTE — Progress Notes (Signed)
Summary: refill, completely out/ hla  Phone Note Refill Request Message from:  Patient on January 19, 2009 10:32 AM  Refills Requested: Medication #1:  LANTUS 100 UNIT/ML SOLN Inject 20 units subcutaneously at bedtime   Dosage confirmed as above?Dosage Confirmed   Supply Requested: 3 months Initial call taken by: Marin Roberts RN,  January 19, 2009 10:33 AM  Follow-up for Phone Call        Talked to [place].  Pt is taking 70/30 two times a day which was supposed to have been D/C'd.  Not taking Lantus which she was supposed to have been on.  Pt to continue 70/30 two times a day, no Lantus,get appt ASAP to reevaluate.         Appended Document: refill, completely out/ hla the relion 70/30 called to pharm...this is what pt has been using... appt given for wed 1/5

## 2010-02-16 NOTE — Assessment & Plan Note (Signed)
Summary: EXTREME M/O CLASS/CH   Allergies: 1)  * Eggs Patient attended a 1 hour [employer]: Lifestyle meeting today. The meeting included information about: healthy food choices - more fruits and vegetables, healthy fats, healthy snacks on a budget- made trail mix and hand weights for them take home today. .   Please ask patient what they learned at their next visit.   Thank you for the referral.  Complete Medication List: 1)  Pravachol 40 Mg Tabs (Pravastatin sodium) .... Take 1 tablet by mouth once a day 2)  Freestyle Lite Strp (Glucose blood) 3)  Bd Ultra-fine Lancets Misc (Lancets) 4)  Glucophage 1000 Mg Tabs (Metformin hcl) .... Take 1 tablet by mouth two times a day 5)  Lisinopril 40 Mg Tabs (Lisinopril) .... Take 1 tablet by mouth once a day 6)  Relion 70/30 70-30 % Susp (Insulin isophane & regular) .... Use 36 units each morning before breakfast and 30 units each evening before dinner. 7)  Glimepiride 2 Mg Tabs (Glimepiride) .... Take 1 tab by mouth daily with main meal. 8)  Tramadol Hcl 50 Mg Tabs (Tramadol hcl) .... Take 1 tablet every 6 hours as needed for pain  Other Orders: No Charge Patient Arrived (NCPA0) (NCPA0)

## 2010-02-16 NOTE — Assessment & Plan Note (Signed)
Summary: acute-30 day for refill(alvarez)/cfb   Vital Signs:  Patient profile:   60 year old female Height:      63 inches (160.02 cm)  Vitals Entered By: Theotis Barrio NT II (December 21, 2009 2:16 PM) CC: MEDICATION REFILL / LEFT HAND -RING AND LITTLE FINGER TIP WITH NUMBNESS Is Patient Diabetic? Yes Did you bring your meter with you today? No Pain Assessment Patient in pain? no      Nutritional Status BMI of > 30 = obese CBG Result 90  Have you ever been in a relationship where you felt threatened, hurt or afraid?No   Does patient need assistance? Functional Status Self care Ambulation Normal   Diabetic Foot Exam Last Podiatry Exam Date: 12/11/2006 Foot Inspection  Diabetic Foot Care Education Patient educated on appropriate care of diabetic feet.  Pulse Check          Right Foot          Left Foot Posterior Tibial:        normal            normal Dorsalis Pedis:        normal            normal    10-g (5.07) Semmes-Weinstein Monofilament Test Performed by: Theotis Barrio NT II          Right Foot          Left Foot Visual Inspection     normal           normal   Primary Care Provider:  Ronda Fairly MD  CC:  MEDICATION REFILL / LEFT HAND -RING AND LITTLE FINGER TIP WITH NUMBNESS.  History of Present Illness: Mandy Mullen is a 60 yo woman with PMH significant for HTN, HLD, DM II who presents to the clinic for general well check up.   1) DM - CBGs running between 130-170. She says that's lower than usual. She denies lightheadedness or dizziness. No abdominal pain, N/V, increased thirst or urination.  No other complaints or concerns today.  Preventive Screening-Counseling & Management  Alcohol-Tobacco     Smoking Status: never  Caffeine-Diet-Exercise     Does Patient Exercise: yes     Type of exercise: WALKING     Times/week: AT TIMES  Current Medications (verified): 1)  Pravachol 40 Mg Tabs (Pravastatin Sodium) .... Take 1 Tablet By Mouth Once A  Day 2)  Freestyle Lite  Strp (Glucose Blood) 3)  Bd Ultra-Fine Lancets  Misc (Lancets) 4)  Glucophage 1000 Mg Tabs (Metformin Hcl) .... Take 1 Tablet By Mouth Two Times A Day 5)  Lisinopril 40 Mg Tabs (Lisinopril) .... Take 1 Tablet By Mouth Once A Day 6)  Relion 70/30 70-30 % Susp (Insulin Isophane & Regular) .... Use 36 Units Each Morning Before Breakfast and 26 Units Each Evening Before Dinner. 7)  Glimepiride 2 Mg Tabs (Glimepiride) .... Take 1 Tab By Mouth Daily With Main Meal.  Allergies (verified): 1)  * Eggs  Past History:  Past Medical History: Last updated: 12/17/2007 Diabetes mellitus, type II Hypertension Recurrent MRSA abscesses Hyperlipidemia Hx of genital herpes Hx of L eye conjunctivitis  Past Surgical History: Last updated: 12/20/2005 S/P I and D for Gluteal abscess 4/06 Total abdominal hysterectomy 1991  Family History: Last updated: 12/11/2006 Family History of CAD Female 1st degree relative <50 (father) Family History Diabetes 1st degree relative (sister) Family History of Stroke F 1st degree relative <60 (sister)  Social History: Last updated:  12/21/2009 Occupation: Currently in school, studying nursing, 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  VS reviewed and BP wnl Neck:  supple.   Lungs:  normal respiratory effort and normal breath sounds.   Heart:  normal rate, regular rhythm, no murmur, no gallop, and no rub.    Abdomen:  soft and non-tender.   Pulses:  pulses were 2+ DP and post tibial bilaterally  Extremities:  no edema noted  Neurologic:  nonfocal   Diabetes Management Exam:    Foot Exam (with socks and/or shoes not present):       Sensory-Monofilament:          Left foot: normal          Right foot: normal   Impression & Recommendations:  Problem # 1:  HYPERTENSION (ICD-401.9) Assessment Improved Blood pressure well controlled, will continue current medication and check BMet today  Her updated medication list for this problem includes:    Lisinopril 40 Mg Tabs (Lisinopril) .Marland Kitchen... Take 1 tablet by mouth once a day  Orders: T-Basic Metabolic Panel [id number])  Prior BP: 126/79 (06/24/2009)  Prior 10 Yr Risk Heart Disease: Not enough information (05/29/2006)  Labs Reviewed: K+: 4.6 (02/25/2009) Creat: : 0.86 (02/25/2009)   Chol: 172 (02/25/2009)   HDL: 47 (02/25/2009)   LDL: 108 (02/25/2009)   TG: 86 (02/25/2009)  Problem # 2:  DIABETES MELLITUS, TYPE II (ICD-250.00) Assessment: Improved A1C has slightly improved from prior. Patient goes into detail that her DM is not well controlled due to her schedule. She is currently in school for nursing and has some night classes and she admits to only snacking and not eating a full meal at that time. When she does not eat dinner, she complains of feeling shaky in the morning, and her blood sugar may be between 70-90.  Plan: Will keep PM insulin dose as is -Will increase AM insulin dose to 40 units Advised patient to try to have full meals, but if not try to replace her snacks with some healthy alternatives.   Her updated medication list for this problem includes:    Glucophage 1000 Mg Tabs (Metformin hcl) .Marland Kitchen... Take 1 tablet by mouth two times a day    Lisinopril 40 Mg Tabs (Lisinopril) .Marland Kitchen... Take 1 tablet by mouth once a day    Relion 70/30 70-30 % Susp (Insulin isophane & regular) ..... Use 40 units each morning before breakfast and 26  units each evening before dinner.    Glimepiride 2 Mg Tabs (Glimepiride) .Marland Kitchen... Take 1 tab by mouth daily with main meal.  Orders: T- Capillary Blood Glucose ([id number]) T-Hgb A1C (in-house) ([id number])  Labs Reviewed: Creat: 0.86 (02/25/2009)     Last Eye Exam: Visual acuity OD (best corrected):     20/20 Visual acuity OS (best corrected):     20/20 Intraocular pressure OD:     18 Intraocular pressure OS:     20  Cataract.    OS:  diabetic retinopathy with dot/blot hem   (07/06/2009) Reviewed HgBA1c results: 10.7 (06/24/2009)  10.6 (02/25/2009)  Problem # 3:  HYPERLIPIDEMIA (ICD-272.4) Patient is not fasting today. Will check lipid profile when pt is fasting in 3 months.  Her updated medication list for this problem includes:    Pravachol 40 Mg Tabs (Pravastatin sodium) .Marland Kitchen... Take 1 tablet by mouth once a day  Labs Reviewed: SGOT: 11 (02/25/2009)   SGPT: 16 (02/25/2009)  Lipid Goals: Chol Goal: 200 (05/29/2006)   HDL Goal: 40 (05/29/2006)   LDL Goal: 100 (05/29/2006)   TG Goal: 150 (05/29/2006)  Prior 10 Yr Risk Heart Disease: Not enough information (05/29/2006)   HDL:47 (02/25/2009), 43 (12/17/2007)  LDL:108 (02/25/2009), 90 (46/96/2952)  Chol:172 (02/25/2009), 165 (12/17/2007)  Trig:86 (02/25/2009), 162 (12/17/2007)  Complete Medication List: 1)  Pravachol 40 Mg Tabs (Pravastatin sodium) .... Take 1 tablet by mouth once a day 2)  Freestyle Lite Strp (Glucose blood) 3)  Bd Ultra-fine Lancets Misc (Lancets) 4)  Glucophage 1000 Mg Tabs (Metformin hcl) .... Take 1 tablet by mouth two times a day 5)  Lisinopril 40 Mg Tabs (Lisinopril) .... Take 1 tablet by mouth once a day 6)  Relion 70/30 70-30 % Susp (Insulin isophane & regular) .... Use 40 units each morning before breakfast and 26 units each evening before dinner. 7)  Glimepiride 2 Mg Tabs (Glimepiride) .... Take 1 tab by mouth daily with main meal.  Patient Instructions: 1)  Please schedule a follow-up appointment in 3  months. 2)  Please remember to bring in your glucose meter. 3)  Please check your blood sugars 3 times a day.  4)  Please take all medications as prescribed. 5)  Please continue with insulin 40 units in the morning and 26 units in the evening.  Prescriptions: RELION 70/30 70-30 % SUSP (INSULIN ISOPHANE & REGULAR) use 36 Units each morning before breakfast and 26 Units each evening before dinner.  #2 vials x 11   Entered and Authorized by:   Melida Quitter MD   Signed by:   Melida Quitter MD on 12/21/2009   Method used:   Electronically to        [facility] [phone]* (retail)       [address]       [place], [place]  [address]       Ph: [phone]       Fax: [phone]   RxID:   [id number] GLIMEPIRIDE 2 MG TABS (GLIMEPIRIDE) Take 1 tab by mouth daily with main meal.  #31 x 6   Entered and Authorized by:   Melida Quitter MD   Signed by:   Melida Quitter MD on 12/21/2009   Method used:   Electronically to        [facility] [address] [phone]* (retail)       [address]       [place], [place]  [address]       Ph: [phone]       Fax: [phone]   RxID:   [id number] LISINOPRIL 40 MG TABS (LISINOPRIL) Take 1 tablet by mouth once a day  #30 x 3   Entered and Authorized by:   Melida Quitter MD   Signed by:   Melida Quitter MD on 12/21/2009   Method used:   Electronically to        [facility] [phone]* (retail)       [address]       [place], [place]  [address]       Ph: [phone]       Fax: [phone]   RxID:   [id number] GLUCOPHAGE 1000 MG TABS (METFORMIN HCL) Take 1 tablet by mouth two times a day  #60 x 3   Entered and Authorized by:   Melida Quitter MD   Signed by:   Melida Quitter MD on 12/21/2009   Method used:   Electronically to        [facility] [address] [phone]* (retail)       [address]       [place], [place]  [address]       Ph: [phone]       Fax: [phone]   RxID:   [id number] PRAVACHOL 40 MG TABS (PRAVASTATIN SODIUM)  Take 1 tablet by mouth once a day  #30 x 11   Entered and Authorized by:   Melida Quitter MD   Signed by:   Melida Quitter MD on 12/21/2009   Method used:   Electronically to        [facility] [address] [phone]* (retail)       [address]       [place], [place]  [address]       Ph: [phone]       Fax: [phone]   RxID:   [id number]    Orders Added: 1)  T- Capillary Blood Glucose [82948] 2)  T-Hgb A1C (in-house) [83036QW] 3)  T-Basic Metabolic Panel [[id number]] 4)  Est. Patient Level III [[address]]   Process Orders Check Orders Results:     [facility]: ABN not required for this insurance Tests Sent for requisitioning (December 21, 2009 3:26 PM):     12/21/2009: [facility] -- T-Basic Metabolic Panel [phone] (signed)     Prevention & Chronic Care Immunizations   Influenza vaccine: Not documented   Influenza vaccine deferral: Contraindicated  (12/21/2009)    Tetanus booster: Not documented   Td booster deferral: Deferred  (01/21/2009)    Pneumococcal vaccine: Pneumovax  (02/07/2008)  Colorectal Screening   Hemoccult: Not documented    Colonoscopy: Not documented   Colonoscopy action/deferral: Deferred  (12/21/2009)  Other Screening   Pap smear: Not documented   Pap smear action/deferral: Not indicated S/P hysterectomy  (06/24/2009)    Mammogram: ASSESSMENT: Negative - BI-RADS 1^MS DIGITAL SCREENING  (03/12/2009)   Mammogram action/deferral: Ordered  (02/25/2009)   Smoking status: never  (12/21/2009)    Screening comments: scheduled for colonoscopy later this month   Diabetes Mellitus   HgbA1C: 9.9  (12/21/2009)   Hemoglobin A1C due: 06/13/2006    Eye exam: Visual acuity OD (best corrected):     20/20 Visual acuity OS (best corrected):     20/20 Intraocular pressure OD:     18 Intraocular pressure OS:     20  Cataract.    OS:  diabetic retinopathy with dot/blot hem    (07/06/2009)   Diabetic eye exam  action/deferral: Ophthalmology referral  (02/25/2009)    Foot exam: yes  (12/21/2009)   Foot exam action/deferral: Do today   High risk foot: No  (02/07/2008)   Foot care education: Done  (12/21/2009)   Foot exam due: 05/29/2007    Urine microalbumin/creatinine ratio: 2.2  (06/15/2007)   Urine microalbumin action/deferral: Not indicated   Urine microalbumin/cr due: 09/06/2006    Diabetes flowsheet reviewed?: Yes   Progress toward A1C goal: Improved  Lipids   Total Cholesterol: 172  (02/25/2009)   Lipid panel action/deferral:  Lipid Panel ordered   LDL: 108  (02/25/2009)   LDL Direct: Not documented   HDL: 47  (02/25/2009)   Triglycerides: 86  (02/25/2009)   Lipid panel due: 09/06/2006    SGOT (AST): 11  (02/25/2009)   BMP action: Ordered   SGPT (ALT): 16  (02/25/2009)   Alkaline phosphatase: 108  (02/25/2009)   Total bilirubin: 0.3  (02/25/2009)    Lipid flowsheet reviewed?: Yes   Progress toward LDL goal: Deteriorated  Hypertension   Last Blood Pressure: 126 / 79  (06/24/2009)   Serum creatinine: 0.86  (02/25/2009)   BMP action: Ordered   Serum potassium 4.6  (02/25/2009)    Hypertension flowsheet reviewed?: Yes   Progress toward BP goal: At goal  Self-Management Support :   Personal Goals (by the next clinic visit) :     Personal A1C goal: 9  (02/25/2009)     Personal blood pressure goal: 130/80  (02/25/2009)     Personal LDL goal: 70  (02/25/2009)    Patient will work on the following items until the next clinic visit to reach self-care goals:     Medications and monitoring: take my medicines every day, check my blood sugar, bring all of my medications to every visit, examine my feet every day  (12/21/2009)     Eating: drink diet soda or water instead of juice or soda, eat more vegetables, use fresh or frozen vegetables, eat foods that are low in salt, eat baked foods instead of fried foods, eat fruit for snacks and desserts, limit or avoid alcohol  (12/21/2009)      Activity: take a 30 minute walk every day  (12/21/2009)     Other: checks blood sugar daily AM  (11/18/2008)     Home glucose monitoring frequency: 2 times a day  (02/25/2009)    Diabetes self-management support: Resources for patients handout  (12/21/2009)   Last diabetes self-management training by diabetes educator: 02/07/2008   Last medical nutrition therapy: 02/07/2008    Hypertension self-management support: Resources for patients handout  (12/21/2009)    Lipid self-management support: Resources for patients handout  (12/21/2009)        Resource handout printed.   Nursing Instructions: Diabetic foot exam today     Last LDL:                                                 108 (02/25/2009 8:00:00 PM)        Diabetic Foot Exam Last Podiatry Exam Date: 12/11/2006 Foot Inspection Is there a history of a foot ulcer?              No Is there a foot ulcer now?              No Can the patient see the bottom of their feet?          Yes Are the shoes appropriate in style and fit?          Yes Is there swelling or an abnormal foot shape?          No Are the toenails long?                No Are the toenails thick?                No Are the toenails ingrown?  No Is there heavy callous build-up?              No Is there a claw toe deformity?                          No Is there elevated skin temperature?            No Is there limited ankle dorsiflexion?            No Is there foot or ankle muscle weakness?            Yes Do you have pain in calf while walking?           No      Diabetic Foot Care Education :Patient educated on appropriate care of diabetic feet.  Pulse Check          Right Foot          Left Foot Posterior Tibial:        normal            normal Dorsalis Pedis:        normal            normal    10-g (5.07) Semmes-Weinstein Monofilament Test Performed by: Theotis Barrio NT II          Right Foot          Left Foot Visual Inspection      normal           normal Test Control      normal         normal Site 1         normal         normal Site 2         normal         normal Site 3         normal         normal Site 4         normal         normal Site 5         normal         normal Site 6         normal         normal Site 7         normal         normal Site 8         normal         normal Site 9         normal         normal Site 10         normal         normal  Impression      normal         normal   Laboratory Results   Blood Tests   Date/Time Received: December 21, 2009 3:09 PM  Date/Time Reported: Alric Quan  December 21, 2009 3:09 PM   HGBA1C: 9.9%   (Normal Range: Non-Diabetic - 3-6%   Control Diabetic - 6-8%) CBG Random:: 90mg /dL      Appended Document: acute-30 day for refill(alvarez)/cfb          Complete Medication List: 1)  Pravachol 40 Mg Tabs (Pravastatin sodium) .... Take 1 tablet by mouth once a day 2)  Freestyle Lite Strp (Glucose blood) 3)  Bd Ultra-fine  Lancets Misc (Lancets) 4)  Glucophage 1000 Mg Tabs (Metformin hcl) .... Take 1 tablet by mouth two times a day 5)  Lisinopril 40 Mg Tabs (Lisinopril) .... Take 1 tablet by mouth once a day 6)  Relion 70/30 70-30 % Susp (Insulin isophane & regular) .... Use 40 units each morning before breakfast and 26 units each evening before dinner. 7)  Glimepiride 2 Mg Tabs (Glimepiride) .... Take 1 tab by mouth daily with main meal.  Other Orders: T-Urine Microalbumin w/creat. ratio [phone])

## 2010-02-16 NOTE — Progress Notes (Signed)
Summary: med refill/gp  Phone Note Refill Request Message from:  Fax from Pharmacy on November 18, 2009 11:01 AM  Refills Requested: Medication #1:  LISINOPRIL 40 MG TABS Take 1 tablet by mouth once a day   Last Refilled: 10/07/2009  Medication #2:  RELION 70/30 70-30 % SUSP use 36 Units each morning before breakfast and 30 Units each evening before dinner.   Last Refilled: 11/01/2009 Last appt. 06/24/09.   Method Requested: Electronic Initial call taken by: Chinita Pester RN,  November 18, 2009 11:01 AM  Follow-up for Phone Call        Rx faxed to pharmacy Follow-up by: Mariea Stable MD,  November 19, 2009 4:36 PM    Prescriptions: RELION 70/30 70-30 % SUSP (INSULIN ISOPHANE & REGULAR) use 36 Units each morning before breakfast and 30 Units each evening before dinner.  #2 vials x 3   Entered and Authorized by:   Mariea Stable MD   Signed by:   Mariea Stable MD on 11/19/2009   Method used:   Electronically to        [facility] [phone]* (retail)       [address]       [place], [place]  [address]       Ph: [phone]       Fax: [phone]   RxID:   [id number] LISINOPRIL 40 MG TABS (LISINOPRIL) Take 1 tablet by mouth once a day  #30 x 3   Entered and Authorized by:   Mariea Stable MD   Signed by:   Mariea Stable MD on 11/19/2009   Method used:   Electronically to        [facility] [phone]* (retail)       [address]       [place], [place]  [address]       Ph: [phone]       Fax: [phone]   RxID:   [id number]

## 2010-02-16 NOTE — Progress Notes (Signed)
  Phone Note Outgoing Call   Summary of Call: Left message for patient to call social work.

## 2010-02-16 NOTE — Assessment & Plan Note (Signed)
Summary: EST-CK/FU/MEDS/CFB   Vital Signs:  Patient profile:   60 year old female Height:      63 inches Weight:      199.8 pounds BMI:     34.19 Temp:     98.6 degrees F oral Pulse rate:   86 / minute BP sitting:   126 / 79  (left arm) Cuff size:   regular CC: PATIENT CONPLAINS OF MOUTH PAIN/LEFT SIDE SWELLING/MEDICATION REFILLS/NEEDS LETTER ALLOWING GYM AT SCHOOL Is Patient Diabetic? Yes Did you bring your meter with you today? No Pain Assessment Patient in pain? yes     Location: MOUTH Intensity: 10 Type: ALL Onset of pain  2 WEEKS Nutritional Status BMI of > 30 = obese CBG Result 152  Have you ever been in a relationship where you felt threatened, hurt or afraid?No   Does patient need assistance? Functional Status Self care Ambulation Normal   Primary Care Provider:  Ronda Fairly MD  CC:  PATIENT CONPLAINS OF MOUTH PAIN/LEFT SIDE SWELLING/MEDICATION REFILLS/NEEDS LETTER ALLOWING GYM AT SCHOOL.  History of Present Illness: Mrs Kealey is a 60 yo woman with PMH as outined in chart.  She is here for routine follow up.  As usual, she does not have her meter today as she was not at home prior to coming in.  Currently using 70/30 36 and 30.  Checks sugar in the morning, sometimes in evening.  Morning:  160s-180s.  Evenings:  200s.  She has been having pain again with teeth on upper, right side.  Associated with swelling.     Preventive Screening-Counseling & Management  Alcohol-Tobacco     Smoking Status: never  Caffeine-Diet-Exercise     Does Patient Exercise: yes     Type of exercise: WALKING     Times/week: AT TIMES  Current Medications (verified): 1)  Pravachol 40 Mg Tabs (Pravastatin Sodium) .... Take 1 Tablet By Mouth Once A Day 2)  Freestyle Lite  Strp (Glucose Blood) 3)  Bd Ultra-Fine Lancets  Misc (Lancets) 4)  Glucophage 1000 Mg Tabs (Metformin Hcl) .... Take 1 Tablet By Mouth Once A Day 5)  Lisinopril 40 Mg Tabs (Lisinopril) .... Take 1 Tablet By Mouth  Once A Day 6)  Relion 70/30 70-30 % Susp (Insulin Isophane & Regular) .... Use 36 Units Each Morning Before Breakfast and 30 Units Each Evening Before Dinner. 7)  Glimepiride 2 Mg Tabs (Glimepiride) .... Take 1 Tab By Mouth Daily With Main Meal.  Allergies (verified): 1)  * Eggs  Past History:  Past Medical History: Last updated: 12/17/2007 Diabetes mellitus, type II Hypertension Recurrent MRSA abscesses Hyperlipidemia Hx of genital herpes Hx of L eye conjunctivitis  Past Surgical History: Last updated: 12/20/2005 S/P I and D for Gluteal abscess 4/06 Total abdominal hysterectomy 1991  Family History: Last updated: 12/11/2006 Family History of CAD Female 1st degree relative <50 (father) Family History Diabetes 1st degree relative (sister) Family History of Stroke F 1st degree relative <60 (sister)  Social History: Last updated: 06/24/2009 Occupation: Currently in school Single Never Smoked Alcohol use-no Drug use-no Regular exercise-yes- trying goes to the [employer]  Risk Factors: Smoking Status: never (06/24/2009)  Social History: Occupation: Currently in school Single Never Smoked Alcohol use-no Drug use-no Regular exercise-yes- trying goes to the [employer]  Review of Systems      See HPI  Physical Exam  General:  alert, well-developed, well-nourished, and well-hydrated.  overweight-appearing.   Eyes:  vision grossly intact, pupils equal, pupils round, and pupils reactive  to light.   Neck:  supple.  no carotid bruits.   Lungs:  normal respiratory effort, no accessory muscle use, normal breath sounds, no crackles, and no wheezes.   Heart:  normal rate, regular rhythm, no murmur, no gallop, and no rub.   Abdomen:  normal bowel sounds.   Extremities:  no edema Neurologic:  alert & oriented X3 and gait normal.   Psych:  Oriented X3.  Very flat affect   Impression & Recommendations:  Problem # 1:  DIABETES MELLITUS, TYPE II (ICD-250.00) Continues to be main  problem Again did not bring meter in Advised on importance for [name] to see what her glucose is like before meals in order to adjust insulin Will refer to Dorothe Pea to assist with possible barriers to adherence Will refer to Jamison Neighbor to assist with diabetes education Will have her see [facility]....has been instructed to do so multiple times without success  >40 minutes face to face  Her updated medication list for this problem includes:    Glucophage 1000 Mg Tabs (Metformin hcl) .Marland Kitchen... Take 1 tablet by mouth two times a day    Lisinopril 40 Mg Tabs (Lisinopril) .Marland Kitchen... Take 1 tablet by mouth once a day    Relion 70/30 70-30 % Susp (Insulin isophane & regular) ..... Use 36 units each morning before breakfast and 30 units each evening before dinner.    Glimepiride 2 Mg Tabs (Glimepiride) .Marland Kitchen... Take 1 tab by mouth daily with main meal.  Orders: T-Hgb A1C (in-house) ([id number]) T- Capillary Blood Glucose (41324) Diabetic Clinic Referral (Diabetic) Social Work Referral (Social )  Labs Reviewed: Creat: 0.86 (02/25/2009)    Reviewed HgBA1c results: 10.7 (06/24/2009)  10.6 (02/25/2009)  Problem # 2:  HYPERTENSION (ICD-401.9) Not taking HCTZ Should be out of lisinopril per refill hx Will continue with lisinopril only....check metabolic panel  The following medications were removed from the medication list:    Hydrochlorothiazide 25 Mg Tabs (Hydrochlorothiazide) .Marland Kitchen... Take 1 tablet by mouth once a day Her updated medication list for this problem includes:    Lisinopril 40 Mg Tabs (Lisinopril) .Marland Kitchen... Take 1 tablet by mouth once a day  Problem # 3:  HYPERLIPIDEMIA (ICD-272.4)  Only taking 20mg  Will check lipids and LFTs when fasting  Advised to take 40mg , new prescription sent again  Her updated medication list for this problem includes:    Pravachol 40 Mg Tabs (Pravastatin sodium) .Marland Kitchen... Take 1 tablet by mouth once a day  Orders: T-Lipid Profile [id number])  Labs  Reviewed: SGOT: 11 (02/25/2009)   SGPT: 16 (02/25/2009)  Lipid Goals: Chol Goal: 200 (05/29/2006)   HDL Goal: 40 (05/29/2006)   LDL Goal: 100 (05/29/2006)   TG Goal: 150 (05/29/2006)  Prior 10 Yr Risk Heart Disease: Not enough information (05/29/2006)   HDL:47 (02/25/2009), 43 (12/17/2007)  LDL:108 (02/25/2009), 90 (64/40/3474)  Chol:172 (02/25/2009), 165 (12/17/2007)  Trig:86 (02/25/2009), 162 (12/17/2007)  Problem # 4:  FRACTURED TOOTH (ICD-873.63) follow up on dental referral will give tramadol for pain  Problem # 5:  SPECIAL SCREENING FOR MALIGNANT NEOPLASMS COLON (ICD-V76.51) have put in referral, will need to be done after seen by [facility].  Orders: Gastroenterology Referral (GI)  Complete Medication List: 1)  Pravachol 40 Mg Tabs (Pravastatin sodium) .... Take 1 tablet by mouth once a day 2)  Freestyle Lite Strp (Glucose blood) 3)  Bd Ultra-fine Lancets Misc (Lancets) 4)  Glucophage 1000 Mg Tabs (Metformin hcl) .... Take 1 tablet by mouth two times a day  5)  Lisinopril 40 Mg Tabs (Lisinopril) .... Take 1 tablet by mouth once a day 6)  Relion 70/30 70-30 % Susp (Insulin isophane & regular) .... Use 36 units each morning before breakfast and 30 units each evening before dinner. 7)  Glimepiride 2 Mg Tabs (Glimepiride) .... Take 1 tab by mouth daily with main meal. 8)  Tramadol Hcl 50 Mg Tabs (Tramadol hcl) .... Take 1 tablet every 6 hours as needed for pain  Other Orders: T-Comprehensive Metabolic Panel ([id number])  Patient Instructions: 1)  Please schedule a follow-up appointment in 1 month. 2)  Make sure to see Jaynee Eagles on your way out. 3)  Will set up with Dorothe Pea, our social worker 4)  Will set up with Jamison Neighbor, our diabetes educator. 5)  Take medications listed below. 6)  If you have any problems before your next appointment, call clinic 7)  Check your blood sugars regularly. If your readings are usually above : or below 70 you should contact our  office. 8)  Will follow up on dental clinic. 9)  Need to have eye exam done 10)  will need to see stomach doctor for colonoscopy after you get card from [facility]. Prescriptions: RELION 70/30 70-30 % SUSP (INSULIN ISOPHANE & REGULAR) use 36 Units each morning before breakfast and 30 Units each evening before dinner.  #2 vials x 3   Entered and Authorized by:   Mariea Stable MD   Signed by:   Mariea Stable MD on 06/24/2009   Method used:   Electronically to        [facility] [phone]* (retail)       [address]       [place], [place]  [address]       Ph: [phone]       Fax: [phone]   RxID:   [id number] LISINOPRIL 40 MG TABS (LISINOPRIL) Take 1 tablet by mouth once a day  #30 x 3   Entered and Authorized by:   Mariea Stable MD   Signed by:   Mariea Stable MD on 06/24/2009   Method used:   Electronically to        [facility] [phone]* (retail)       [address]       [place], [place]  [address]       Ph: [phone]       Fax: [phone]   RxID:   [id number] PRAVACHOL 40 MG TABS (PRAVASTATIN SODIUM) Take 1 tablet by mouth once a day  #30 x 3   Entered and Authorized by:   Mariea Stable MD   Signed by:   Mariea Stable MD on 06/24/2009   Method used:   Electronically to        [facility] [phone]* (retail)       [address]       [place], [place]  [address]       Ph: [phone]       Fax: [phone]   RxID:   [id number] TRAMADOL HCL 50 MG TABS (TRAMADOL HCL) take 1 tablet every 6 hours as needed for pain  #120 x 0   Entered and Authorized by:   Mariea Stable MD   Signed by:   Mariea Stable MD on 06/24/2009   Method used:   Electronically to        [facility] [phone]* (retail)       [address]  [place], [place]  [address]       Ph: [phone]       Fax: [phone]   RxID:   [id number] GLUCOPHAGE 1000 MG TABS (METFORMIN HCL) Take 1 tablet by mouth two times a day  #60 x 3    Entered and Authorized by:   Mariea Stable MD   Signed by:   Mariea Stable MD on 06/24/2009   Method used:   Electronically to        [facility] [phone]* (retail)       [address]       [place], [place]  [address]       Ph: [phone]       Fax: [phone]   RxID:   [id number]  Process Orders Check Orders Results:     [facility]: ABN not required for this insurance Tests Sent for requisitioning (June 24, 2009 4:02 PM):     06/24/2009: [facility] -- T-Lipid Profile [phone] (signed)     06/24/2009: [facility] -- T-Comprehensive Metabolic Panel [id number] (signed)    Prevention & Chronic Care Immunizations   Influenza vaccine: Not documented   Influenza vaccine deferral: Deferred  (01/21/2009)    Tetanus booster: Not documented   Td booster deferral: Deferred  (01/21/2009)    Pneumococcal vaccine: Pneumovax  (02/07/2008)  Colorectal Screening   Hemoccult: Not documented    Colonoscopy: Not documented   Colonoscopy action/deferral: GI referral  (06/24/2009)  Other Screening   Pap smear: Not documented   Pap smear action/deferral: Not indicated S/P hysterectomy  (06/24/2009)    Mammogram: ASSESSMENT: Negative - BI-RADS 1^MS DIGITAL SCREENING  (03/12/2009)   Mammogram action/deferral: Ordered  (02/25/2009)   Smoking status: never  (06/24/2009)  Diabetes Mellitus   HgbA1C: 10.7  (06/24/2009)   Hemoglobin A1C due: 06/13/2006    Eye exam: Not documented   Diabetic eye exam action/deferral: Ophthalmology referral  (02/25/2009)    Foot exam: yes  (02/07/2008)   High risk foot: No  (02/07/2008)   Foot care education: Not documented   Foot exam due: 05/29/2007    Urine microalbumin/creatinine ratio: 2.2  (06/15/2007)   Urine microalbumin action/deferral: Not indicated   Urine microalbumin/cr due: 09/06/2006    Diabetes flowsheet reviewed?: Yes   Progress toward  A1C goal: Unchanged  Lipids   Total Cholesterol: 172  (02/25/2009)   Lipid panel action/deferral: Lipid Panel ordered   LDL: 108  (02/25/2009)   LDL Direct: Not documented   HDL: 47  (02/25/2009)   Triglycerides: 86  (02/25/2009)   Lipid panel due: 09/06/2006    SGOT (AST): 11  (02/25/2009)   BMP action: Ordered   SGPT (ALT): 16  (02/25/2009) CMP ordered    Alkaline phosphatase: 108  (02/25/2009)   Total bilirubin: 0.3  (02/25/2009)    Lipid flowsheet reviewed?: Yes   Progress toward LDL goal: Unchanged  Hypertension   Last Blood Pressure: 126 / 79  (06/24/2009)   Serum creatinine: 0.86  (02/25/2009)   BMP action: Ordered   Serum potassium 4.6  (02/25/2009) CMP ordered     Hypertension flowsheet reviewed?: Yes   Progress toward BP goal: At goal  Self-Management Support :   Personal Goals (by the next clinic visit) :     Personal A1C goal: 9  (02/25/2009)     Personal blood pressure goal: 130/80  (02/25/2009)     Personal LDL goal: 70  (02/25/2009)  Patient will work on the following items until the next clinic visit to reach self-care goals:     Medications and monitoring: take my medicines every day, check my blood sugar, weigh myself weekly, examine my feet every day  (06/24/2009)     Eating: drink diet soda or water instead of juice or soda, eat more vegetables, use fresh or frozen vegetables, eat foods that are low in salt, eat baked foods instead of fried foods, eat fruit for snacks and desserts, limit or avoid alcohol  (06/24/2009)     Activity: take a 30 minute walk every day, take the stairs instead of the elevator, park at the far end of the parking lot  (06/24/2009)     Other: checks blood sugar daily AM  (11/18/2008)     Home glucose monitoring frequency: 2 times a day  (02/25/2009)    Diabetes self-management support: Resources for patients handout, Written self-care plan  (06/24/2009)   Diabetes care plan printed   Last diabetes self-management training by  diabetes educator: 02/07/2008   Referred for diabetes self-mgmt training.   Last medical nutrition therapy: 02/07/2008    Hypertension self-management support: Resources for patients handout, Written self-care plan  (06/24/2009)   Hypertension self-care plan printed.    Lipid self-management support: Resources for patients handout, Written self-care plan  (06/24/2009)   Lipid self-care plan printed.      Resource handout printed.   Nursing Instructions: GI referral for screening colonoscopy (see order) Needs note that she is ok to attend gym at school Needs to see Jaynee Eagles for finances Needs f/u on denal appointment....referral in 11/2008 Needs appointment with Dorothe Pea Needs appointment with Jamison Neighbor   Laboratory Results   Blood Tests   Date/Time Received: June 24, 2009 3:33 PM Date/Time Reported: Alric Quan  June 24, 2009 3:33 PM   HGBA1C: 10.7%   (Normal Range: Non-Diabetic - 3-6%   Control Diabetic - 6-8%) CBG Random:: 152mg /dL

## 2010-02-16 NOTE — Assessment & Plan Note (Signed)
Summary: Social Work  Social Work.  Assessment and Counseling.  40 minutes.  Patient with history of high A1C and uncontrolled diabetes.   Met with Mandy Mullen in my office and initially find her to be somewhat reserved in speaking with me but once I explained my role as Child psychotherapist and her physician's concerns she seemed to be more relaxed and open.   The patient lives alone in a rental home. 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 She reports not getting along with the supervisor who made things difficult and that supervisor also had high turnover on her floor.   The patient receives unemployment and is able to pay her rent and bills.  There is a niece in the picture who helps her out when she can. The patient is unable to name any other social or family supports.    The patient receives $16 per month in Foodstamps and states that is not enough money for her to purchase healthy foods.  She shops via sales and also at some of the lower priced stores like [employer], [address]  etc.   The patient is from [place] and graduated from [employer].   The patient is currently in school at [employer].  The plan is to transfer to [employer] where she can become an LPN and then an Charity fundraiser.    The patient has just recd her [employer] for [employer] and plans to access [employer] for her medications and diabetes supplies though she needs to get a copy of her unemployment statement. I told her to get a copy from [facility] and that will save her a trip to unemployment office.   I strongly encouraged her to call Jamison Neighbor for further diabetes education since she has had uncontrolled diabetes for several years now.  She was worried about a bill but I explained that 100% discount would cost her a small copay.   A/P  --Patient will access [facility] [employer]/Needs unemployment statement from [place] to submit to [facility] which she will get today.  --Make appmt  with Jamison Neighbor for further diabetes education.  --Accessed our food pantry today for bag of canned goods:  tuna, vegetables, beans.  Patient declined info about area food pantries.  --SW as needed.

## 2010-02-16 NOTE — Progress Notes (Signed)
Summary: Refill/gh  Phone Note Refill Request Message from:  Patient on December 17, 2009 1:56 PM  Refills Requested: Medication #1:  RELION 70/30 70-30 % SUSP use 36 Units each morning before breakfast and 30 Units each evening before dinner.  Method Requested: Electronic Initial call taken by: Angelina Ok RN,  December 17, 2009 1:57 PM  Follow-up for Phone Call        Please refer to note from 06/24/09.  Will provide 1 month only!    Needs appointment, will fax [place].  Thanks. Follow-up by: Mariea Stable MD,  December 18, 2009 12:03 AM    Prescriptions: RELION 70/30 70-30 % SUSP (INSULIN ISOPHANE & REGULAR) use 36 Units each morning before breakfast and 30 Units each evening before dinner.  #2 vials x 0   Entered and Authorized by:   Mariea Stable MD   Signed by:   Mariea Stable MD on 12/18/2009   Method used:   Electronically to        [facility] [phone]* (retail)       [address]       [place], [place]  [address]       Ph: [phone]       Fax: [phone]   RxID:   [id number]

## 2010-02-16 NOTE — Progress Notes (Signed)
Summary: Refill/gh  Phone Note Refill Request Message from:  Fax from Pharmacy on January 19, 2009 12:28 PM  Refills Requested: Medication #1:  LANTUS 100 UNIT/ML SOLN Inject 20 units subcutaneously at bedtime  Method Requested: Electronic Initial call taken by: Angelina Ok RN,  January 19, 2009 12:28 PM  Follow-up for Phone Call        Rx faxed to pharmacy Follow-up by: Mariea Stable MD,  January 19, 2009 3:14 PM    Prescriptions: LANTUS 100 UNIT/ML SOLN (INSULIN GLARGINE) Inject 20 units subcutaneously at bedtime  #1 vial x 0   Entered and Authorized by:   Mariea Stable MD   Signed by:   Mariea Stable MD on 01/19/2009   Method used:   Electronically to        [facility] [phone]* (retail)       [address]       [place], [place]  [address]       Ph: [phone]       Fax: [phone]   RxID:   [id number]   Appended Document: Refill/gh please see refill from earlier.  pt is using 70/30 instead of latus.  prescription sent for 70/30 earlier.  will cancel prescription for lantus that was sent in.

## 2010-02-16 NOTE — Assessment & Plan Note (Signed)
Summary: ACUTE-ALVAREZ/MED CLARIFICATION/CH   Vital Signs:  Patient profile:   60 year old female Height:      63 inches (160.02 cm) Weight:      193.6 pounds (87.14 kg) BMI:     34.08 Temp:     97.1 degrees F (36.17 degrees C) oral Pulse rate:   92 / minute BP sitting:   148 / 82  (right arm) Cuff size:   reglar  Vitals Entered By: Theotis Barrio NT II (January 21, 2009 1:47 PM) CC: MEDICATION CLARIFICAITON   / DM Is Patient Diabetic? Yes Did you bring your meter with you today? No Pain Assessment Patient in pain? no       Have you ever been in a relationship where you felt threatened, hurt or afraid?No   Does patient need assistance? Functional Status Self care Ambulation Normal Comments MEDICATION CLARIFICATION   Primary Care Provider:  Ronda Fairly MD  CC:  MEDICATION CLARIFICAITON   / DM.  History of Present Illness: 60 yo woman with PMH as listed below but significant for HTN, DM, hyperlipidemia who presents for clarification of her DM regimen:  She went to the pharmacy after her last visit and was told that she was going to be given Lantus. When she went to the pharmacy the pharmacy gave her her usual dose of 70/30 instead and this is what she has been using [36U AM, 26U PM]. She has not taken Lantus in the last several months even though it shows she was prescribed this several months ago  When it was time to refill her medications there was some confusion about what insulin she was taking and she was told to get an appointment today.   Now that she is here she says that she cannot pay for Lantus and that she has no form of drug coverage. She says that at this moment we need to stick with what she can afford.  She has not met with Rudell Cobb.   She did not bring her meter.   HTN: Well controlled at past visits. Has been taking all meds. But says that she is stressed about school and feels that her BP may be up a little.    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) 6)  Total Abdominal Hysterectomy, Hx of  (ICD-V45.77) 7)  Diabetes Mellitus, Type II  (ICD-250.00)  Allergies (verified): 1)  * Eggs  Review of Systems  The patient denies anorexia, fever, weight loss, weight gain, vision loss, decreased hearing, hoarseness, chest pain, syncope, dyspnea on exertion, peripheral edema, prolonged cough, headaches, hemoptysis, abdominal pain, melena, hematochezia, severe indigestion/heartburn, hematuria, incontinence, genital sores, muscle weakness, suspicious skin lesions, transient blindness, difficulty walking, depression, unusual weight change, and abnormal bleeding.    Physical Exam  General:  alert, well-developed, well-nourished, and well-hydrated.   Head:  normocephalic and atraumatic.   Eyes:  vision grossly intact, pupils equal, pupils round, and pupils reactive to light.   Ears:  R ear normal and L ear normal.   Nose:  no external deformity.   Lungs:  normal respiratory effort, normal breath  sounds, no crackles, and no wheezes.   Heart:  normal rate, regular rhythm, no murmur, no gallop, and no rub.   Abdomen:  soft, non-tender, and normal bowel sounds.   Neurologic:  alert & oriented X3, cranial nerves II-XII intact, and strength normal in all extremities.   Psych:  Oriented X3, memory intact for recent and remote, normally interactive, good eye contact, not anxious appearing, and not depressed appearing.   Additional Exam:  Manual BP check in room on upper right arm: 165/80.   Impression & Recommendations:  Problem # 1:  DIABETES MELLITUS, TYPE II (ICD-250.00) Although it has  been listed in her medication list the patient has not actually been using Lantus. She says that she cannot afford Lantus. She did not bring her meter with her but says that she checks her sugars once daily in the AM before eating and they are usually 170-180. She has no way of paying for medications except out of pocket and I discussed just leaving her on the 70/30 she has been using. I will have her check her sugars and bring her meter in 2 weeks to see if the dose needs to be titrated once we have some blood glucose data. She is going to meet with Rudell Cobb to see about getting financial assistace.  Not up to date on eye exam. SHe will meet with Rudell Cobb and can hopefully be referred at next visit if she gets orange card. Checking Bmet today. Bmet WNL but glucose was 45. Not sure if this represents real value, but I did call her and tell her to be mindful of any signs of low blood glucose, to call the clinic if it is ever below 70 and to increase her blood glucose checks from once in the morning to add another check either before lunch or in the afternoon to make sure she is not having any daytime lows after taking her morning dose of 70/30.  The following medications were removed from the medication list:    Lantus 100 Unit/ml Soln (Insulin glargine) ..... Inject 20 units subcutaneously at bedtime Her updated medication list for this problem includes:    Glucophage 1000 Mg Tabs (Metformin hcl) .Marland Kitchen... Take 1 tablet by mouth two times a day    Lisinopril 40 Mg Tabs (Lisinopril) .Marland Kitchen... Take 1 tablet by mouth once a day    Relion 70/30 70-30 % Susp (Insulin isophane & regular) ..... Use 36 units each morning before breakfast and 26 units each evening before dinner.  Problem # 2:  HYPERTENSION (ICD-401.9) My recheck today shows 165/80 in room. Although she has had some "at goal" readings in the flowsheet, she has also had several that are above goal. She is just on Lisinopril. I will add HCTZ.  Checking metabolic panel today. Will recheck Bmet and BP at 2 week followup.   Bmet WNL.   Her updated medication list for this problem includes:    Lisinopril 40 Mg Tabs (Lisinopril) .Marland Kitchen... Take 1 tablet by mouth once a day    Hydrochlorothiazide 25 Mg Tabs (Hydrochlorothiazide) .Marland Kitchen... Take 1 tablet by mouth once a day  Orders: T-Comprehensive Metabolic Panel ([id number])  Problem # 3:  HYPERLIPIDEMIA (ICD-272.4) Lipids not checked since 2009. Not fasting today. WIll check lipids in 2 weeks at followup. She will come to this appointment fasting.  Checking LFTs today. Were WNL 11/2007.  LFTs WNL.   Her updated medication list for this problem includes:    Pravachol 40 Mg Tabs (  Pravastatin sodium) .Marland Kitchen... Take 1 tablet by mouth once a day  Orders: T-Comprehensive Metabolic Panel ([id number])  Problem # 4:  Preventive Health Care (ICD-V70.0) Not up to date on mammo. Needs colonoscopy for screening. She wants me to wait today since she is out of pocket only. She is meeting with Rudell Cobb today regarding financial assistance and she can be referred for some of these services at next visit if she has met with Rudell Cobb.   Complete Medication List: 1)  Pravachol 40 Mg Tabs (Pravastatin sodium) .... Take 1 tablet by mouth once a day 2)  Freestyle Lite Strp (Glucose blood) 3)  Bd Ultra-fine Lancets Misc (Lancets) 4)  Glucophage 1000 Mg Tabs (Metformin hcl) .... Take 1 tablet by mouth two times a day 5)  Lisinopril 40 Mg Tabs (Lisinopril) .... Take 1 tablet by mouth once a day 6)  Relion 70/30 70-30 % Susp (Insulin isophane & regular) .... Use 36 units each morning before breakfast and 26 units each evening before dinner. 7)  Hydrochlorothiazide 25 Mg Tabs (Hydrochlorothiazide) .... Take 1 tablet by mouth once a day  Patient Instructions: 1)  Please resume using your 70/30 insulin and schedule a followup appointment in  ~2 weeks. 2)  Please check your blood glucose daily in the  morning before eating. 3)  Please meet with Rudell Cobb regarding finacial assistance.  4)  PLEASE COME FASTING TO YOUR NEXT APPOINTMENT.  Prescriptions: HYDROCHLOROTHIAZIDE 25 MG TABS (HYDROCHLOROTHIAZIDE) Take 1 tablet by mouth once a day  #31 x 2   Entered and Authorized by:   Aris Lot MD   Signed by:   Aris Lot MD on 01/21/2009   Method used:   Print then Give to Patient   RxID:   [id number]   Prevention & Chronic Care Immunizations   Influenza vaccine: Not documented   Influenza vaccine deferral: Deferred  (01/21/2009)    Tetanus booster: Not documented   Td booster deferral: Deferred  (01/21/2009)    Pneumococcal vaccine: Pneumovax  (02/07/2008)  Colorectal Screening   Hemoccult: Not documented    Colonoscopy: Not documented   Colonoscopy action/deferral: Deferred  (01/21/2009)  Other Screening   Pap smear: Not documented   Pap smear action/deferral: Deferred  (01/21/2009)    Mammogram: Normal  (05/21/2004)   Mammogram action/deferral: Deferred  (01/21/2009)   Smoking status: never  (01/21/2009)  Diabetes Mellitus   HgbA1C: 10.5  (11/18/2008)   Hemoglobin A1C due: 06/13/2006    Eye exam: Not documented    Foot exam: yes  (02/07/2008)   High risk foot: No  (02/07/2008)   Foot care education: Not documented   Foot exam due: 05/29/2007    Urine microalbumin/creatinine ratio: 2.2  (06/15/2007)   Urine microalbumin/cr due: 09/06/2006    Diabetes flowsheet reviewed?: Yes   Progress toward A1C goal: Unchanged  Lipids   Total Cholesterol: 165  (12/17/2007)   LDL: 90  (12/17/2007)   LDL Direct: Not documented   HDL: 43  (12/17/2007)   Triglycerides: 162  (12/17/2007)   Lipid panel due: 09/06/2006    SGOT (AST): 14  (12/17/2007)   SGPT (ALT): 19  (12/17/2007) CMP ordered    Alkaline phosphatase: 96  (12/17/2007)   Total bilirubin: 0.3  (12/17/2007)    Lipid flowsheet reviewed?: Yes   Progress toward LDL goal:  Unchanged  Hypertension   Last Blood Pressure: 148 / 82  (01/21/2009)   Serum creatinine: 0.85  (12/17/2007)   Serum potassium 4.3  (12/17/2007) CMP ordered  Hypertension flowsheet reviewed?: Yes   Progress toward BP goal: Deteriorated  Self-Management Support :    Patient will work on the following items until the next clinic visit to reach self-care goals:     Medications and monitoring: take my medicines every day, check my blood pressure, examine my feet every day  (01/21/2009)     Eating: drink diet soda or water instead of juice or soda, eat more vegetables, use fresh or frozen vegetables, eat foods that are low in salt, eat baked foods instead of fried foods, eat fruit for snacks and desserts, limit or avoid alcohol  (01/21/2009)     Activity: take a 30 minute walk every day  (01/21/2009)     Other: checks blood sugar daily AM  (11/18/2008)    Diabetes self-management support: Written self-care plan  (01/21/2009)   Diabetes care plan printed   Last diabetes self-management training by diabetes educator: 02/07/2008   Last medical nutrition therapy: 02/07/2008    Hypertension self-management support: Written self-care plan  (01/21/2009)   Hypertension self-care plan printed.    Lipid self-management support: Written self-care plan  (01/21/2009)   Lipid self-care plan printed.  Process Orders Check Orders Results:     [facility]: ABN not required for this insurance Tests Sent for requisitioning (January 22, 2009 5:51 PM):     01/21/2009: [facility] -- T-Comprehensive Metabolic Panel [phone] (signed)

## 2010-02-16 NOTE — Miscellaneous (Signed)
Summary: Orders Update  Clinical Lists Changes  Orders: Added new Referral order of Diabetic Clinic Referral (Diabetic) - Signed

## 2010-02-18 NOTE — Miscellaneous (Signed)
Summary: [facility] PREVISIT/PREP  Clinical Lists Changes  Medications: Added new medication of MIRALAX   POWD (POLYETHYLENE GLYCOL 3350) As per prep  instructions. - Signed Added new medication of METOCLOPRAMIDE HCL 10 MG  TABS (METOCLOPRAMIDE HCL) As per prep instructions. - Signed Added new medication of DULCOLAX 5 MG  TBEC (BISACODYL) Day before procedure take 2 at 3pm and 2 at 8pm. - Signed Rx of MIRALAX   POWD (POLYETHYLENE GLYCOL 3350) As per prep  instructions.;  #255gm x 0;  Signed;  Entered by: Wyona Almas RN;  Authorized by: Iva Boop MD, [facility];  Method used: Electronically to [facility] [phone]*, [address] [place], [place]  [address], Ph: [phone], Fax: [phone] Rx of METOCLOPRAMIDE HCL 10 MG  TABS (METOCLOPRAMIDE HCL) As per prep instructions.;  #2 x 0;  Signed;  Entered by: Wyona Almas RN;  Authorized by: Iva Boop MD, [facility];  Method used: Electronically to [facility] [phone]*, [address], [place], [place]  [address], Ph: [phone], Fax: [phone] Rx of DULCOLAX 5 MG  TBEC (BISACODYL) Day before procedure take 2 at 3pm and 2 at 8pm.;  #4 x 0;  Signed;  Entered by: Wyona Almas RN;  Authorized by: Iva Boop MD, [facility];  Method used: Electronically to [facility] [phone]*, [address] [place], [place]  [address], Ph: [phone], Fax: [phone] Observations: Added new observation of ALLERGY REV: Done (12/29/2009 13:10)    Prescriptions: DULCOLAX 5 MG  TBEC (BISACODYL) Day before procedure take 2 at 3pm and 2 at 8pm.  #4 x 0   Entered by:   Wyona Almas RN   Authorized by:   Iva Boop MD, [facility]   Signed by:   Wyona Almas RN on 12/29/2009   Method used:   Electronically to        [facility] [phone]* (retail)       [address]       [place], [place]  [address]       Ph: [phone]       Fax: [phone]   RxID:   [id number] METOCLOPRAMIDE HCL 10 MG  TABS (METOCLOPRAMIDE HCL) As per prep instructions.   #2 x 0   Entered by:   Wyona Almas RN   Authorized by:   Iva Boop MD, [facility]   Signed by:   Wyona Almas RN on 12/29/2009   Method used:   Electronically to        [facility] [phone]* (retail)       [address]       [place], [place]  [address]       Ph: [phone]       Fax: [phone]   RxID:   [id number] MIRALAX   POWD (POLYETHYLENE GLYCOL 3350) As per prep  instructions.  #255gm x 0   Entered by:   Wyona Almas RN   Authorized by:   Iva Boop MD, [facility]   Signed by:   Wyona Almas RN on 12/29/2009   Method used:   Electronically to        [facility] [phone]* (retail)       [address]       [place], [place]  [address]       Ph: [phone]       Fax: [phone]   RxID:   [id number]

## 2010-02-18 NOTE — Procedures (Signed)
Summary: Colonoscopy  Patient: [name] Note: All result statuses are Final unless otherwise noted.  Tests: (1) Colonoscopy (COL)   COL Colonoscopy           DONE     [facility]     [address]     [place], [place]  [address]           COLONOSCOPY PROCEDURE REPORT           PATIENT:  Mandy Mullen, Mandy Mullen  MR#:  [id number]     BIRTHDATE:  [birth year], 59 yrs. old  GENDER:  female     ENDOSCOPIST:  Iva Boop, MD, [facility]     REF. BY:  Mariea Stable, M.D.     PROCEDURE DATE:  01/12/2010     PROCEDURE:  Colonoscopy with snare polypectomy     ASA CLASS:  Class III     INDICATIONS:  Routine Risk Screening     MEDICATIONS:   Fentanyl 25 mcg IV, Versed 5 mg IV           DESCRIPTION OF PROCEDURE:   After the risks benefits and     alternatives of the procedure were thoroughly explained, informed     consent was obtained.  Digital rectal exam was performed and     revealed no abnormalities.   The LB [id number] endoscope     was introduced through the anus and advanced to the cecum, which     was identified by both the appendix and ileocecal valve, without     limitations.  The quality of the prep was adequate, using MiraLax.     The instrument was then slowly withdrawn as the colon was fully     examined. Insertion: 1:40 minutes Withdrawal: 17:18 minutes     <<PROCEDUREIMAGES>>           FINDINGS:  Two polyps were found. They were diminutive. Ascending     and descending colon polyps. Polyps were snared without cautery.     Retrieval was successful. This was otherwise a normal examination     of the colon.   Retroflexed views in the rectum revealed no     abnormalities.    The scope was then withdrawn from the patient     and the procedure completed.           COMPLICATIONS:  None     ENDOSCOPIC IMPRESSION:     1) Two diminutive (<5mm) polyps removed     2) Otherwise normal examination with adequate prep           REPEAT EXAM:  In for Colonoscopy, pending  biopsy results.           Iva Boop, MD, [facility]           CC:  Mariea Stable MD     The Patient           n.     Rosalie Doctor:   Iva Boop at 01/12/2010 09:14 AM           Renaldo Fiddler, [id number]  Note: An exclamation mark (!) indicates a result that was not dispersed into the flowsheet. Document Creation Date: 01/12/2010 9:15 AM _______________________________________________________________________  (1) Order result status: Final Collection or observation date-time: 01/12/2010 09:05 Requested date-time:  Receipt date-time:  Reported date-time:  Referring Physician:   Ordering Physician: Stan Head [phone]) Specimen Source:  Source: Launa Grill Order Number: [id number] Lab site:   Appended Document: Colonoscopy  Colonoscopy  Procedure date:  01/12/2010  Findings:          1) Two diminutive (<77mm) polyps removed     2) Otherwise normal examination with adequate prep   1. Colon, polyp(s), ascending and descending :  -  TUBULAR ADENOMA. -  HYPERPLASTIC POLYP. 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   Procedures Next Due Date:    Colonoscopy: 01/2015   Appended Document: Colonoscopy     Procedures Next Due Date:    Colonoscopy: 12/2014

## 2010-02-18 NOTE — Miscellaneous (Signed)
Summary: Stat Glucose Draw  Clinical Lists Changes  Orders: Added new Test order of TLB-Glucose, QUANT ([id number]) - Signed

## 2010-02-18 NOTE — Letter (Signed)
Summary: [facility] Instructions  [facility]  [address] [place], [place] [address]   Phone: [phone]  Fax: [phone]       LYNELLE WEILER    November 25, 1950    MRN: [id number]       Procedure Day Dorna Bloom:  Jake Shark  01/12/10     Arrival Time: 7:30AM     Procedure Time:  8:30AM     Location of Procedure:                    Juliann Pares  [facility] (4th Floor)   PREPARATION FOR COLONOSCOPY WITH MIRALAX  Starting 5 days prior to your procedure 01/07/10 do not eat nuts, seeds, popcorn, corn, beans, peas,  salads, or any raw vegetables.  Do not take any fiber supplements (e.g. Metamucil, Citrucel, and Benefiber). ____________________________________________________________________________________________________   THE DAY BEFORE YOUR PROCEDURE         DATE: 01/11/10   DAY: WEDNESDAY  1   Drink clear liquids the entire day-NO SOLID FOOD  2   Do not drink anything colored red or purple.  Avoid juices with pulp.  No orange juice.  3   Drink at least 64 oz. (8 glasses) of fluid/clear liquids during the day to prevent dehydration and help the prep work efficiently.  CLEAR LIQUIDS INCLUDE: Water Jello Ice Popsicles Tea (sugar ok, no milk/cream) Powdered fruit flavored drinks Coffee (sugar ok, no milk/cream) Gatorade Juice: apple, white grape, white cranberry  Lemonade Clear bullion, consomm, broth Carbonated beverages (any kind) Strained chicken noodle soup Hard Candy  4   Mix the entire bottle of Miralax with 64 oz. of Gatorade/Powerade in the morning and put in the refrigerator to chill.  5   At 3:00 pm take 2 Dulcolax/Bisacodyl tablets.  6   At 4:30 pm take one Reglan/Metoclopramide tablet.  7  Starting at 5:00 pm drink one 8 oz glass of the Miralax mixture every 15-20 minutes until you have finished drinking the entire 64 oz.  You should finish drinking prep around 7:30 or 8:00 pm.  8   If you are nauseated, you may take the 2nd  Reglan/Metoclopramide tablet at 6:30 pm.        9    At 8:00 pm take 2 more DULCOLAX/Bisacodyl tablets.     THE DAY OF YOUR PROCEDURE      DATE:  01/12/10   DAY: Jake Shark  You may drink clear liquids until 6:30AM  (2 HOURS BEFORE PROCEDURE).   MEDICATION INSTRUCTIONS  Unless otherwise instructed, you should take regular prescription medications with a small sip of water as early as possible the morning of your procedure.  Diabetic patients - see separate instructions.  Additional medication instructions: Hold HCTZ the morning of procedure.         OTHER INSTRUCTIONS  You will need a responsible adult at least 59 years of age to accompany you and drive you home.   This person must remain in the waiting room during your procedure.  Wear loose fitting clothing that is easily removed.  Leave jewelry and other valuables at home.  However, you may wish to bring a book to read or an iPod/MP3 player to listen to music as you wait for your procedure to start.  Remove all body piercing jewelry and leave at home.  Total time from sign-in until discharge is approximately 2-3 hours.  You should go home directly after your procedure and rest.  You can resume normal activities  the day after your procedure.  The day of your procedure you should not:   Drive   Make legal decisions   Operate machinery   Drink alcohol   Return to work  You will receive specific instructions about eating, activities and medications before you leave.   The above instructions have been reviewed and explained to me by   Wyona Almas RN  December 29, 2009 1:59 PM     I fully understand and can verbalize these instructions _____________________________ Date _______

## 2010-02-18 NOTE — Letter (Signed)
Summary: Diabetic Instructions  [facility]  [address] [place], [place] [address]   Phone: [phone]  Fax: [phone]    Mandy Mullen December 12, 1950 MRN: [id number]   _  x_   ORAL DIABETIC MEDICATION INSTRUCTIONS  The day before your procedure:   Take your diabetic pill as you do normally  The day of your procedure:   Do not take your diabetic pill    We will check your blood sugar levels during the admission process and again in Recovery before discharging you home  ________________________________________________________________________  _ x _   INSULIN (LONG ACTING) MEDICATION INSTRUCTIONS (Lantus, NPH, 70/30, Humulin, Novolin-N)   The day before your procedure:   Take  your regular evening dose    The day of your procedure:   Do not take your morning dose

## 2010-02-18 NOTE — Letter (Signed)
Summary: Patient Notice- Polyp Results  [facility]  [address] [place], [place] [address]   Phone: [phone]  Fax: [phone]        January 13, 2010 MRN: [id number]    [facility] [address] [place], [place]  [address]    Dear Ms. Mikula,  One of the polyps removed from your colon were adenomatous. This means that they were pre-cancerous or that  they had the potential to change into cancer over time. The other polyp was not a typical pre-cancerous polyp.  I recommend that you have a repeat colonoscopy in 5 years to determine if you have developed any new polyps over time and screen for colorectal cancer. If you develop any new rectal bleeding, abdominal pain or significant bowel habit changes, please contact us before then.  In addition to repeating colonoscopy, changing health habits may reduce your risk of having more colon or rectal  polyps and possibly, colorectal cancer. You may lower your risk of future polyps and colorectal cancer by adopting healthy habits such as not smoking or using tobacco (if you do), being physically active, losing weight (if overweight), and eating a diet which includes fruits and vegetables and limits red meat. Getting your diabetes under better control is also important to reduce the risk of colon cancer and improve your overall health.  Please call us if you are having persistent problems or have questions about your condition that have not been fully answered at this time.  Sincerely,  Iva Boop MD, [facility]  This letter has been electronically signed by your physician.  Appended Document: Patient Notice- Polyp Results Letter mailed

## 2010-03-29 LAB — GLUCOSE, CAPILLARY
Glucose-Capillary: 361 mg/dL — ABNORMAL HIGH (ref 70–99)
Glucose-Capillary: 404 mg/dL — ABNORMAL HIGH (ref 70–99)
Glucose-Capillary: 407 mg/dL — ABNORMAL HIGH (ref 70–99)

## 2010-04-07 LAB — GLUCOSE, CAPILLARY: Glucose-Capillary: 313 mg/dL — ABNORMAL HIGH (ref 70–99)

## 2010-05-03 LAB — GLUCOSE, CAPILLARY: Glucose-Capillary: 132 mg/dL — ABNORMAL HIGH (ref 70–99)

## 2010-06-04 NOTE — Op Note (Signed)
[name], Mandy Mullen              ACCOUNT NO.:  [id number]   MEDICAL RECORD NO.:  [id number]          PATIENT TYPE:  INP   LOCATION:  5741                         FACILITY:  [facility]   PHYSICIAN:  Sandria Bales. Ezzard Standing, M.D.  DATE OF BIRTH:  [birth year]   DATE OF PROCEDURE:  04/09/2004  DATE OF DISCHARGE:                                 OPERATIVE REPORT   PREOPERATIVE DIAGNOSIS:  Right buttock abscess, approximately 5-6 cm in  size.   POSTOPERATIVE DIAGNOSIS:  Right buttock abscess, approximately 5-6 cm in  size.   PROCEDURE:  Incision and drainage, right buttock abscess.   SURGEON:  Sandria Bales. Ezzard Standing, M.D.   ANESTHESIA:  8 mL 1% Xylocaine.   COMPLICATIONS:  None.   INDICATION FOR PROCEDURE:  [name] is a an approximately 60 year old  black female who has a right buttock abscess about for days.  Has presented  to the medicine teaching service, and they plan to admit her and I am now  seeing her for incision and drainage of this abscess.   In a prone position, the right buttock area was prepped with Betadine  solution and infiltrated with 8 mL of 1% lidocaine.  I then made a linear  incision approximately 3 cm into this abscess, cut out a core of about 1 cm  of skin and packed it with iodoform gauze.  She tolerated the procedure  well.  We will remove the gauze tomorrow and start sitz baths tomorrow.      DHN/MEDQ  D:  04/08/2004  T:  04/09/2004  Job:  [id number]

## 2010-06-04 NOTE — Consult Note (Signed)
Mandy Mullen, Mandy Mullen              ACCOUNT NO.:  [id number]   MEDICAL RECORD NO.:  [id number]          PATIENT TYPE:  INP   LOCATION:  5741                         FACILITY:  [facility]   PHYSICIAN:  Sandria Bales. Ezzard Standing, M.D.  DATE OF BIRTH:  Jul 16, 1950   DATE OF CONSULTATION:  04/08/2004  DATE OF DISCHARGE:                                   CONSULTATION   REFERRING PHYSICIAN:  Fransisco Hertz, M.D.   REASON FOR CONSULTATION:  Right buttock abscess.   HISTORY OF ILLNESS:  This is a 60 year old black female who has had  diabetes.  She presents with a right buttock abscess of about four to five  days' duration.   She has had other boils in her groin before with her menstrual periods, but  nothing in her buttocks.  She has had no previous abscesses drained.   PAST MEDICAL HISTORY:  Significant in that she is diabetic.   MEDICATIONS:  She is on Glucophage 1000 mg b.i.d.  She is on glipizide 10 mg  daily and Lantus 23 units at night.   SOCIAL HISTORY:  She works as a Tour manager.   PHYSICAL EXAMINATION:  VITAL SIGNS:  Her temperature is 100.3 degrees, pulse  95 and blood pressure 128/81.  GENERAL APPEARANCE:  She is a well-nourished black female, alert and  cooperative on physical exam.  ABDOMEN:  Soft.  She has no tenderness and no guarding of her abdomen in the  prone position.  SKIN:  On her right buttock, she has an approximate 6-7 cm indurated area.   LABORATORY DATA:  Her laboratories that I have show a hemoglobin A1C of 9.7,  a glucose of 199 and a cholesterol of 213.   IMPRESSION AND PLAN:  1.  Right buttock abscess.  The plan is incision and drainage in the      medicine clinic.  2.  Diabetes mellitus.  The medicine teaching service plans to admit her      overnight.      DHN/MEDQ  D:  04/08/2004  T:  04/08/2004  Job:  [id number]   cc:   Zetta Bills, MD  Internal Medicine Resident - [address]  [place]  [place] [address]  Fax: [phone]

## 2010-06-04 NOTE — Discharge Summary (Signed)
NAMEALEXANDRINA, Mandy Mullen              ACCOUNT NO.:  [id number]   MEDICAL RECORD NO.:  [id number]          PATIENT TYPE:  INP   LOCATION:  5741                         FACILITY:  [facility]   PHYSICIAN:  Zetta Bills, MD          DATE OF BIRTH:  Aug 13, 1950   DATE OF ADMISSION:  04/08/2004  DATE OF DISCHARGE:  04/09/2004                                 DISCHARGE SUMMARY   CHIEF COMPLAINT:  Sore area on her right upper buttock and her continuity  doctor is Donald Pore.  Dr. Ezzard Standing from Surgery was consulted on this  patient.   DISCHARGE DIAGNOSES:  1.  Right gluteal abscess.  2.  Diabetes mellitus.   DISCHARGE MEDICATIONS:  1.  Ciprofloxacin 500 mg p.o. b.i.d. for 14 days.  2.  Doxycycline 100 mg p.o. b.i.d. for 14 days.  3.  Metformin 1000 mg b.i.d.  4.  Glipizide 10 mg daily.  5.  Lantus 23 units q.h.s.   Ms. Phillipson was discharged to her house.  She will follow up with Dr. Ezzard Standing  within 5-7 days.  She also has an appointment to follow up with Dr. Elvin So  in two weeks.  For her appointment with Dr. Jeanella Craze her hemoglobin A1c needs  to be checked as it was high at the time of admission.  During her admission  Ms. Portnoy had an incision and drainage procedure performed by Dr. Ezzard Standing  without complications.  Ms. Dolezal's admission H&P included a chief  complaint of pain in her right buttock for 5-6 days.  She noticed it and it  was about the size of a golf ball which she described as boil on her right  upper buttock that was very painful to sit on.  She used soaks in the tub  with Epsom salts and a product called Boil Ease to try to make it better,  but it continued to get worse.  On Tuesday the boil began to drain  spontaneously and it has been draining ever since then.  MS. Beyersdorf HAS NO  KNOWN DRUG ALLERGIES.   PAST MEDICAL HISTORY:  History of boils in the groin area when she was  younger, but never that were bad enough that had to be lanced.  She also has  a history of diabetes for  four years.  She had a hysterectomy in 1991, and  at that time her left ovary was removed it had a cyst on it, as well as her  uterus.  Ms. Zea has never smoked.  She does not drink or do drugs.  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  She lives at  home with her 59 year old son.   REVIEW OF SYSTEMS:  As stated in the HPI.   PHYSICAL EXAMINATION:  Her pulse is 95, blood pressure 128/81, temperature  100.3, respiratory rate 20, she was sating 98% on room air.  Her physical  exam was benign except for a large tangerine-sized mass on the upper right  gluteal area.   LABS:  White blood cell count 14.1, H&H 12.3 and  35.6, platelets 371, sodium  133, potassium 4.0, chloride 100, bicarbonate 25, BUN 12, creatinine 0.9,  glucose 195, hemoglobin A1c 9.7, CBG 199, cholesterol 213, triglycerides 81,  HDL 46, LDL 151, PT 12.0, INR 0.8, bilirubin 0.5, alkaline phosphatase 81,  AST 13, ALT 11, albumin 2.8, calcium 8.9, Gram stain of the wound showed  rare white blood cells and gram-positive cocci in pairs.   HOSPITAL COURSE:  Included the incision and drainage on April 08, 2004 of  her abscess.  The wound was cleaned and dressed and remained clean, dry and  intact until the time of discharge.   DISCHARGE LABORATORIES:  White blood cell count 12.4, hemoglobin 11.2,  hematocrit 33.2, platelets 385, sodium 137, potassium 4.1, chloride 102,  bicarbonate 28, BUN 13, creatinine 1.0, glucose 271.  AST 13, ALT 11,  alkaline phosphatase 81, bilirubin 0.5, albumin 2.8.  Her temperature was  98.1, at the time of discharge blood pressure 104/57, heart rate 80,  respiratory rate 25, she was satting 95% on room air.      JP/MEDQ  D:  04/11/2004  T:  04/11/2004  Job:  [id number]   cc:   Sandria Bales. Ezzard Standing, M.D.  [address]  [place]  [place] [address]   Donald Pore, MD  Internal Medicine Resident - [address]  [place]  [place] [address]  Fax: [phone]

## 2010-06-18 ENCOUNTER — Encounter: Payer: Self-pay | Admitting: Internal Medicine

## 2010-06-21 ENCOUNTER — Encounter: Payer: Self-pay | Admitting: Internal Medicine

## 2010-06-21 ENCOUNTER — Ambulatory Visit (INDEPENDENT_AMBULATORY_CARE_PROVIDER_SITE_OTHER): Payer: Self-pay | Admitting: Internal Medicine

## 2010-06-21 VITALS — BP 120/70 | HR 66 | Temp 98.9°F | Ht 63.0 in | Wt 196.5 lb

## 2010-06-21 DIAGNOSIS — I10 Essential (primary) hypertension: Secondary | ICD-10-CM

## 2010-06-21 DIAGNOSIS — Z299 Encounter for prophylactic measures, unspecified: Secondary | ICD-10-CM

## 2010-06-21 DIAGNOSIS — E785 Hyperlipidemia, unspecified: Secondary | ICD-10-CM

## 2010-06-21 DIAGNOSIS — E119 Type 2 diabetes mellitus without complications: Secondary | ICD-10-CM

## 2010-06-21 DIAGNOSIS — Z00129 Encounter for routine child health examination without abnormal findings: Secondary | ICD-10-CM | POA: Insufficient documentation

## 2010-06-21 LAB — LIPID PANEL
Cholesterol: 182 mg/dL (ref 0–200)
HDL: 47 mg/dL (ref >39)
Total CHOL/HDL Ratio: 3.9 Ratio
Triglycerides: 100 mg/dL (ref <150)
VLDL: 20 mg/dL (ref 0–40)

## 2010-06-21 LAB — GLUCOSE, CAPILLARY: Glucose-Capillary: 106 mg/dL — ABNORMAL HIGH (ref 70–99)

## 2010-06-21 MED ORDER — METFORMIN HCL 1000 MG PO TABS: 1000.0000 mg | ORAL_TABLET | Freq: Two times a day (BID) | ORAL | Status: DC

## 2010-06-21 MED ORDER — INSULIN NPH ISOPHANE & REGULAR (70-30) 100 UNIT/ML ~~LOC~~ SUSP: SUBCUTANEOUS | Status: DC

## 2010-06-21 MED ORDER — BD ULTRA-FINE LANCETS MISC: Status: DC

## 2010-06-21 MED ORDER — GLUCOSE BLOOD VI STRP: ORAL_STRIP | Status: DC

## 2010-06-21 MED ORDER — PRAVASTATIN SODIUM 40 MG PO TABS: 40.0000 mg | ORAL_TABLET | Freq: Every day | ORAL | Status: DC

## 2010-06-21 MED ORDER — LISINOPRIL 40 MG PO TABS: 40.0000 mg | ORAL_TABLET | Freq: Every day | ORAL | Status: DC

## 2010-06-21 NOTE — Assessment & Plan Note (Signed)
Patient is taking pravastatin. She is tolerating it well. I will check lipid panel today. Consider to change dosage accordingly.

## 2010-06-21 NOTE — Assessment & Plan Note (Signed)
Patient is up to date with colonoscopy.  Patient does not her Pap smear since patient had total hysterectomy. Patient has mammogram in 02/ 2011 which was within normal limits.

## 2010-06-21 NOTE — Assessment & Plan Note (Signed)
Blood pressure is well-controlled today with honey 120/70. Will continue lisinopril.

## 2010-06-21 NOTE — Progress Notes (Signed)
  Subjective:    Patient ID: Mandy Mullen, female    DOB: 22-Mar-1950, 60 y.o.   MRN: [id number]  HPI This is a 27 old female with past significant for diabetes, hypertension, hyperlipidemia who comes in here for a re regular office visit and medication refills.  1. Diabetes: Patient noted that he she has been taking her insulin on a regular basis. Her schedule is better at this point. She is able to eat her meals including breakfast,  lunch and dinner. She does not check her sugar is on a daily basis but noted that she has episodes where she wakes up early in the morning between 5 in 6 AM due to low blood sugars. She notices some discomfort and dizziness with it. The lowest blood sugar was 61. Today morning her blood sugar was 81.  Off note: Testing with her pharmacy patient and was refilling her insulin in a monthly basis. She should actually have at least one refill per month if she would use her insulin as prescribed.   2. Hypertension: Patient is tolerating lisinopril well. Patient denies any headache, dizziness, chest pain or shortness of breath.  Review of Systems  Constitutional: Negative for fever, activity change, appetite change and fatigue.  Eyes: Negative for visual disturbance.  Respiratory: Negative for chest tightness and shortness of breath.   Cardiovascular: Negative for chest pain and palpitations.  Gastrointestinal: Negative for nausea, diarrhea, constipation and abdominal distention.  Genitourinary: Negative for difficulty urinating.  Musculoskeletal: Negative for back pain and arthralgias.  Neurological: Negative for dizziness and weakness.       Objective:   Physical Exam  Constitutional: She is oriented to person, place, and time. She appears well-developed and well-nourished.  HENT:  Head: Normocephalic.  Neck: Neck supple.  Cardiovascular: Normal rate, regular rhythm and normal heart sounds.   Pulmonary/Chest: Effort normal and breath sounds normal.    Abdominal: Soft. Bowel sounds are normal. She exhibits no distension. There is no tenderness.  Musculoskeletal: Normal range of motion.  Neurological: She is alert and oriented to person, place, and time.          Assessment & Plan:

## 2010-06-21 NOTE — Assessment & Plan Note (Addendum)
Hemoglobin A1c is 10 today which is not a significant change from December of 2011(9.9). Patient noted that she has been compliant with her medication. I doubt this since she is not refilling her insulin as she is supposed to do. A refill list noted that she gets insulin once a month. The she will is taking her insulin on a regular basis she at least needs to refill it twice a months. At this point more worried about her hypoglycemic episodes. Therefore I will reduce her evening dose to 16 units and increase the morning dose to 50 units. I discontinued her in Glimepiride and it is most likely not contributing at all to control her diet. I will continue metformin caused milligrams twice a day. Advise the patient to check her blood sugars twice a day in the morning and in the evening for a week. She needs to call the clinic for the results. We will make changes in insulin dosage accordingly.  I also provided  Handouts for  Diabetes education since patient did not want to see  Jamison Neighbor today.  For the future patient needs better blood sugar control.  I will refer the patient to an ophthalmologist. Her last one year ago. Will check  Microalb/creatinine ratio today

## 2010-06-21 NOTE — Patient Instructions (Signed)
Diabetes and Exercise Regular exercise is important and can help:    Control blood glucose (sugar).   Decrease blood pressure.   Control blood lipids (cholesterol and triglycerides).   Improve overall health.  BENEFITS FROM EXERCISE:  Improved fitness.   Improved flexibility.   Improved endurance.   Increased bone density.   Weight control.   Increased muscle strength.   Decreased body fat.   Improvement of the body's use of a hormone called insulin.   Increased insulin sensitivity.   Reduction of insulin needs.   Helps you feel better.   Reduces stress and tension.  People with diabetes who add exercise to their lifestyle gain additional benefits.    Weight loss.   Reduces appetite.   Improves body's use of blood glucose (sugar).   Decreases risk factors for heart disease:   Lowering of cholesterol and triglycerides.   Raising the level of good cholesterol (high-density lipoproteins [HDL]).   Lowering blood sugar.   Decreases blood pressure.  TYPE 1 DIABETES AND EXERCISE  Exercise will usually lower your blood glucose.   If blood glucose is greater than 240 mg/dl, check urine ketones. If ketones are present, do not exercise.   Location of the insulin injection sites may need to be adjusted with exercise. Avoid injecting insulin into areas of the body that will be exercised. For example, avoid injecting insulin into:   The arms when playing tennis.   The legs when jogging. For more information, discuss this with your caregiver.   Keep a record of:   Food intake.   Type and amount of exercise.   Expected peak times of insulin action.   Blood glucose (sugar) levels.  Do this before, during and after exercise. Review your records with your caregiver(s). This will help you to develop guidelines for adjusting food intake and/or insulin amounts.   TYPE 2 DIABETES AND EXERCISE  Regular physical activity can help control blood glucose.   Exercise  is important because it may:   Increase the body's sensitivity to insulin.   Improve blood glucose control.   Exercise reduces the risk of heart disease. It decreases serum cholesterol and triglycerides. It also lowers blood pressure.   Those who take insulin or oral hypoglycemic agents should watch for signs of hypoglycemia. These signs include dizziness, shaking, sweating, chills and confusion.   Body water is lost during exercise. It must be replaced. This will help to avoid loss of body fluids (dehydration) and/or heat stroke.  Be sure to talk to your caregiver before starting an exercise program to make sure it is safe for you. Remember, any activity is better than none.   Document Released: 03/26/2003 Document Re-Released: 10/31/2008 [facility] Patient Information 2011 [place], [place].Diabetes Meal Planning Guide The diabetes meal planning guide is a tool to help you plan your meals and snacks. It is important for people with diabetes to manage their blood sugar levels. Choosing the right foods and the right amounts throughout your day will help control your blood sugar. Eating right can even help you improve your blood pressure and reach or maintain a healthy weight. CARBOHYDRATE COUNTING MADE EASY When you eat carbohydrates, they turn to sugar (glucose). This raises your blood sugar level. Counting carbohydrates can help you control this level so you feel better. When you plan your meals by counting carbohydrates, you can have more flexibility in what you eat and balance your medicine with your food intake. Carbohydrate counting simply means adding up the total amount  of carbohydrate grams (g) in your meals or snacks. Try to eat about the same amount at each meal. Foods with carbohydrates are listed below. Each portion below is 1 carbohydrate serving or 15 grams of carbohydrates. Ask your dietician how many grams of carbohydrates you should eat at each meal or snack. Grains and Starches 1  slice bread 1/2 English muffin or hotdog/hamburger bun 3/4 cup cold cereal (unsweetened) 1/3 cup cooked pasta or rice 1/2 cup starchy vegetables (corn, potatoes, peas, beans, winter squash) 1 tortilla (6 inches) 1/4 bagel 1 waffle or pancake (size of a CD) 1/2 cup cooked cereal 4 to 6 small crackers *Whole grain is recommended Fruit 1 cup fresh unsweetened berries, melon, papaya, pineapple 1 small fresh fruit 1/2 banana or mango 1/2 cup fruit juice (4 ounces unsweetened) 1/2 cup canned fruit in natural juice or water 2 tablespoons dried fruit 12 to 15 grapes or cherries Milk and Yogurt 1 cup fat-free or 1% milk 1 cup soy milk 6 ounces light yogurt with sugar-free sweetener 6 ounces low-fat soy yogurt 6 ounces plain yogurt Vegetables 1 cup raw or 1/2 cup cooked is counted as 0 carbohydrates or a "free" food. If you eat 3 or more servings at one meal, count them as 1 carbohydrate serving. Other Carbohydrates 3/4 ounces chips or pretzels 1/2 cup ice cream or frozen yogurt 1/4 cup sherbet or sorbet 2 inch square cake, no frosting 1 tablespoon honey, sugar, jam, jelly, or syrup 2 small cookies 3 squares of graham crackers 3 cups popcorn 6 crackers 1 cup broth-based soup Count 1 cup casserole or other mixed foods as 2 carbohydrate servings. Foods with less than 20 calories in a serving may be counted as 0 carbohydrates or a "free" food. You may want to purchase a book or computer software that lists the carbohydrate gram counts of different foods. In addition, the nutrition facts panel on the labels of the foods you eat are a good source of this information. The label will tell you how big the serving size is and the total number of carbohydrate grams you will be eating per serving. Divide this number by 15 to obtain the number of carbohydrate servings in a portion. Remember: 1 carbohydrate serving equals 15 grams of carbohydrate. SERVING SIZES Measuring foods and serving sizes  helps you make sure you are getting the right amount of food. The list below tells how big or small some common serving sizes are.  1 ounce (oz) of cheese.................................4 stacked dice.   2 to 3 oz cooked meat.................................[place] KitchenDeck of cards.   1 teaspoon (tsp)...........................................Marland KitchenTip of little finger.   1 tablespoon (tbs).......................................Marland KitchenMarland KitchenThumb.   2 tbs............................................................Marland KitchenGolf ball.    cup..........................................................Marland KitchenHalf of a fist.   1 cup...........................................................Marland KitchenA fist.  SAMPLE DIABETES MEAL PLAN Below is a sample meal plan that includes foods from the grain and starches, dairy, vegetable, fruit, and meat groups. A dietician can individualize a meal plan to fit your calorie needs and tell you the number of servings needed from each food group. However, controlling the total amount of carbohydrates in your meal or snack is more important than making sure you include all of the food groups at every meal. You may interchange carbohydrate containing foods (dairy, starches, and fruits). The meal plan below is an example of a 2000 calorie diet using carbohydrate counting. This meal plan has 17 carbohydrate servings (carb choices). Breakfast  1 cup oatmeal (2 carb choices)  3/4 cup light yogurt (1 carb choice) 1 cup blueberries (1  carb choice) 1/4 cup almonds   Snack  1 large apple (2 carb choices)  1 low-fat string cheese stick   Lunch  Chicken breast salad:   1 cup spinach     1/4 cup chopped tomatoes     2 oz chicken breast, sliced     2 tbs low-fat [place] dressing  12 whole-wheat crackers (2 carb choices) 12 to 15 grapes (1 carb choice) 1 cup low-fat milk (1 carb choice)   Snack  1 cup carrots  1/2 cup hummus (1 carb choice)   Dinner  3 oz broiled salmon  1 cup brown rice (3 carb choices)     Snack  1 1/2 cups steamed broccoli (1 carb choice) drizzled with 1 tsp olive oil and lemon juice  1 cup light pudding (2 carb choices)   DIABETES MEAL PLANNING WORKSHEET Your dietician can use this worksheet to help you decide how many servings of foods and what types of foods are right for you.   Breakfast Food Group and Servings Carb Choices Grain/Starches _______________________________________ Dairy ______________________________________________ Vegetable _______________________________________ Fruit _______________________________________________ Meat _______________________________________________ Fat _____________________________________________ Lunch Food Group and Servings Carb Choices Grain/Starches ________________________________________ Dairy _______________________________________________ Fruit ________________________________________________ Meat ________________________________________________ Fat _____________________________________________ Dinner Food Group and Servings Carb Choices Grain/Starches ________________________________________ Dairy _______________________________________________ Fruit ________________________________________________ Meat ________________________________________________ Fat _____________________________________________ Snacks Food Group and Servings Carb Choices Grain/Starches ________________________________________ Dairy _______________________________________________ Vegetable ________________________________________ Fruit ________________________________________________ Meat ________________________________________________ Fat _____________________________________________ Daily Totals Starches _________________________  Vegetable __________________________ Fruit ______________________________ Dairy ______________________________ Meat ______________________________ Fat ________________________________   Document Released:  09/30/2004 Document Re-Released: 06/23/2009 [employer] Patient Information 2011 [place], [place].Eating Away From Home with Diabetes  There are times when you will eat in a restaurant or you will have meals that have been prepared by someone else. You can enjoy eating out. Note that the portions in restaurants may be much larger than needed. Listed below are some ideas to help you choose foods that will keep your blood sugar in better control.   HINTS FOR EATING OUT  Know your meal plan and how many carbohydrate choices you should have at each meal. You may wish to carry a copy of your meal plan in your purse or wallet. Learn the foods included in each food group.   Make a list of restaurants near you that offer healthy choices and pick up carry out menus to see what they offer. You can then plan what you will order ahead of time.   Become familiar with serving sizes by practicing them at home using measuring cups and spoons. Once you learn to recognize portion sizes, you will be able to correctly estimate the amount of total carbohydrate you are allowed to eat at the restaurant. Ask for a "to-go box" if the portion is more than you should have. When your food comes, leave the amount you should have on the plate, and put the rest in the to-go box before you start eating.   Plan ahead if your meal time will be different than usual. Check with your caregiver to find out how to time meals and medication if you are taking insulin.   Avoid high fat foods such as fried foods, cream sauces, full fat salad dressings or any added butter or margarine.   Do not be afraid to ask questions. Ask your server about the portion size, cooking methods, ingredients and if items can be substituted. Restaurants do not list all available items on the menu. You can ask for your main entree to be  prepared using skim milk, oil instead of butter or margarine, and without gravy or sauces. Ask your waiter or waitress to serve salad  dressings, gravy, sauces, margarine, and sour cream on the side. You can then add the amount your meal plan suggests.   Add more vegetables whenever possible.   Avoid items that are labeled "jumbo," "giant" deluxe" or" super sized."   You may want to split an entre with someone and order an extra side salad.   Watch for hidden calories in foods like croutons, bacon or cheese.   Ask your server to take away the bread basket or chips from your table.   Order a dinner salad as an appetizer.  You can eat most foods served in a restaurant. Some foods are better choices than others. FOOD GROUP: Breads & Starches  RECOMMENDED: All kinds of bread (wheat, rye, white, oatmeal, [place], [place], raisin); hard or soft dinner rolls; frankfurter or hamburger buns; small bagels, small corn or whole wheat flour tortillas.   AVOID/USE SPARINGLY: Frosted or glazed breads; butter rolls; egg or cheese breads; croissants; sweet rolls; pastries; coffee cake; glazed or frosted doughnuts, muffins. FOOD GROUP: Crackers RECOMMENDED: Animal crackers, graham, rye, saltine, oyster, and matzoth crackers. Bread sticks, melba toast; rusks, pretzels, popcorn (without fat), zwieback toast. AVOID/USE SPARINGLY: High-fat snack crackers or chips. Buttered popcorn. FOOD GROUP: Cereals RECOMMENDED: Hot and cold cereals. Whole grains such as oatmeal or shredded wheat are good choices. AVOID/USE SPARINGLY: Sugar-coated or granola type cereals. FOOD GROUP:Potatoes/Pasta/Rice/Beans RECOMMENDED: Order baked, boiled, or mashed potatoes; rice or noodles without added fat; whole beans. Order gravies, butter, margarine, or sauces on the side so you can control the amount you add.   AVOID/USE SPARINGLY: Hash brown or fried potatoes. Potatoes, pasta, or rice prepared with cream or cheese sauce. Potato or pasta salads prepared with large amounts of dressing. Fried beans or fried rice. FOOD GROUP: Vegetables RECOMMENDED: Order steamed,  baked, boiled, or stewed vegetables without sauces or extra fat. Ask that sauce be served on the side. If vegetables are not listed on the menu, ask what is available.   AVOID/USE SPARINGLY: Vegetables prepared with cream, butter, or cheese sauce. Fried vegetables. FOOD GROUP: Salad Bars RECOMMENDED: Many of the vegetables at a salad bar are considered "free." Use lemon juice, vinegar, or low-calorie salad dressing (fewer than 20 calories per serving) as "free" dressings for your salad. Look for salad bar ingredients that have no added fat or sugar such as tomatoes, lettuce, cucumbers, brocolli, carrots, onions and mushrooms. AVOID/USE SPARINGLY: Prepared salads with large amounts of dressing, such as coleslaw, caesar salad, macaroni salad, bean salad or carrot salad.   FOOD GROUP: Fruit RECOMMENDED: Eat fresh fruit or fresh fruit salad without added dressing. A salad bar often offers fresh fruit choices, but canned fruit at a restaurant is usually packed in sugar or syrup. AVOID/USE SPARINGLY: Sweetened canned or frozen fruits, plain or sweetened fruit juice. Fruit salads with dressing, sour cream, or sugar added to them. FOOD GROUP: Meat & Substitutes RECOMMENDED: Order broiled, baked, roasted, or grilled meat, poultry, or fish. Trim off all visible fat. Do not eat the skin of poultry. The size stated on the menu is the raw weight; meat shrinks by 1/4 in cooking (for example, 4 oz. raw equals 3 oz. cooked meat). AVOID/USE SPARINGLY: Deep-fat fried meat, poultry, or fish. Breaded meats. FOOD GROUP: Eggs RECOMMENDED: Order soft or hard cooked eggs, or poached, or scrambled. Omelets may be okay, depending on what ingredients  are added. Egg substitutes are also a good choice.   AVOID/USE SPARINGLY: Fried eggs, eggs prepared with cream or cheese sauce. FOOD GROUP: Milk RECOMMENDED: Order lowfat or fat free milk according to your meal plan. Plain, nonfat yogurt or flavored yogurt with no sugar added may  be used as a substitute for milk. Soy milk may also be used. AVOID/USE SPARINGLY: Milk shakes or sweetened milk beverages. FOOD GROUP: Soups & Combination Foods RECOMMENDED: Clear broth or consomm are free foods and may be used as an appetizer. Broth-based soups with fat removed count as a starch serving and are preferred over cream soups. Soups made with beans or split peas may be eaten but count as a starch. AVOID/USE SPARINGLY: Fatty soups; soup made with cream; cheese soup. Combination foods prepared with excessive amounts of fat or with cream or cheese sauces. FOOD GROUP: Desserts & Sweets RECOMMENDED: Ask for fresh fruit. Sponge or angel food cake without icing, ice milk, no sugar added ice cream, sherbet, or frozen yogurt may fit into your meal plan occasionally. AVOID/USE SPARINGLY: Pastries, puddings, pies, cakes with icing, custard, gelatin desserts. FOOD GROUP: Fats & Oils RECOMMENDED: Choose healthy fats such as olive oil, canola oil or tub margarine, reduced fat or fat free sour cream, cream cheese, avocado or nuts. AVOID/USE SPARINGLY: Any fats in excess of your allowed portion. Deep-fried foods or any food with a large amount of fat. NOTE Ask for all fats to be served on the side, and limit your portion sizes according to your meal plan. Document Released: 01/03/2005 Document Re-Released: 10/30/2008 [facility] Patient Information 2011 [place], [place].

## 2010-06-22 ENCOUNTER — Other Ambulatory Visit: Payer: Self-pay | Admitting: Internal Medicine

## 2010-06-22 DIAGNOSIS — E119 Type 2 diabetes mellitus without complications: Secondary | ICD-10-CM

## 2010-06-22 LAB — MICROALBUMIN / CREATININE URINE RATIO: Microalb Creat Ratio: 2.9 mg/g (ref 0.0–30.0)

## 2010-06-22 MED ORDER — LISINOPRIL 40 MG PO TABS: 40.0000 mg | ORAL_TABLET | Freq: Every day | ORAL | Status: DC

## 2010-06-22 MED ORDER — PRAVASTATIN SODIUM 40 MG PO TABS: 80.0000 mg | ORAL_TABLET | Freq: Every day | ORAL | Status: DC

## 2010-11-01 LAB — COMPREHENSIVE METABOLIC PANEL
AST: 20
Albumin: 3.4 — ABNORMAL LOW
BUN: 16
Calcium: 9.3
Chloride: 104
Creatinine, Ser: 1.05
GFR calc Af Amer: >60
Total Bilirubin: 1
Total Protein: 6.7

## 2010-11-01 LAB — DIFFERENTIAL
Basophils Absolute: 0
Lymphocytes Relative: 11 — ABNORMAL LOW
Lymphs Abs: 0.9
Monocytes Absolute: 0.2
Neutro Abs: 6.2

## 2010-11-01 LAB — URINALYSIS, ROUTINE W REFLEX MICROSCOPIC
Glucose, UA: NEGATIVE
Hgb urine dipstick: NEGATIVE
Ketones, ur: 15 — AB
Nitrite: NEGATIVE
Protein, ur: NEGATIVE
Specific Gravity, Urine: 1.027
Urobilinogen, UA: 0.2
pH: 5.5

## 2010-11-01 LAB — CBC
HCT: 38.3
MCHC: 33.7
MCV: 85.2
Platelets: 314
RDW: 13.9

## 2010-11-01 LAB — URINE MICROSCOPIC-ADD ON

## 2010-11-15 ENCOUNTER — Telehealth: Payer: Self-pay | Admitting: *Deleted

## 2010-11-15 NOTE — Telephone Encounter (Signed)
Pharmacy would like to change this to Novolin 70/30 instead.  Humulin is D/C.

## 2010-11-18 ENCOUNTER — Encounter: Payer: Self-pay | Admitting: Internal Medicine

## 2010-12-01 ENCOUNTER — Other Ambulatory Visit: Payer: Self-pay | Admitting: *Deleted

## 2010-12-01 DIAGNOSIS — E119 Type 2 diabetes mellitus without complications: Secondary | ICD-10-CM

## 2010-12-01 MED ORDER — INSULIN NPH ISOPHANE & REGULAR (70-30) 100 UNIT/ML ~~LOC~~ SUSP: SUBCUTANEOUS | Status: DC

## 2010-12-01 NOTE — Telephone Encounter (Signed)
Pt was called; message left about her appt tomorrow.

## 2010-12-01 NOTE — Telephone Encounter (Signed)
Please remind her of her appt tomorrow.

## 2010-12-02 ENCOUNTER — Encounter: Payer: Self-pay | Admitting: Internal Medicine

## 2010-12-02 ENCOUNTER — Ambulatory Visit (INDEPENDENT_AMBULATORY_CARE_PROVIDER_SITE_OTHER): Payer: Self-pay | Admitting: Internal Medicine

## 2010-12-02 VITALS — BP 142/85 | HR 80 | Temp 98.8°F | Ht 63.0 in | Wt 195.2 lb

## 2010-12-02 DIAGNOSIS — E119 Type 2 diabetes mellitus without complications: Secondary | ICD-10-CM

## 2010-12-02 DIAGNOSIS — E785 Hyperlipidemia, unspecified: Secondary | ICD-10-CM

## 2010-12-02 DIAGNOSIS — I10 Essential (primary) hypertension: Secondary | ICD-10-CM

## 2010-12-02 LAB — GLUCOSE, CAPILLARY: Glucose-Capillary: 366 mg/dL — ABNORMAL HIGH (ref 70–99)

## 2010-12-02 MED ORDER — INSULIN NPH ISOPHANE & REGULAR (70-30) 100 UNIT/ML ~~LOC~~ SUSP: SUBCUTANEOUS | Status: DC

## 2010-12-02 NOTE — Assessment & Plan Note (Signed)
Blood pressure today 142/85 is also slightly above goal. However her lab work and further management was deferred to focus on diabetes management. I did counsel Mandy Mullen that she should try to decrease her blood pressure and to engage in physical activity when possible.

## 2010-12-02 NOTE — Assessment & Plan Note (Signed)
Mandy Mullen's A1c is very high today at 11.2 this corresponds to an average blood glucose in the 300s. I doubt that she is taking her insulin as described however in the nighttime hypoglycemia worries me. I will suggested that she take insulin 70/30 50 units in the morning and 20 units in the evening. I also gave her printed information regarding to [employer] glucometer. She can get this for $9 and the strips will be $42 for 100 strips. This is far less than she is paying per month with the Accu-Chek meter. I told her to check her blood sugar glucose at least twice per day preferably before breakfast and dinner. She will return back in 6 weeks after she has tried checking her blood glucose and really taking care of her sugar. I asked her that in the 3 days prior to this visit she should check her both pre-and postprandial blood sugars as well as each bedtime. I told her it is very important to bring her glucometer to each clinic visit.  I deferred lab work and ophthalmology referral today as she does not have the orange card. I told her it is imperative that she reinstate her orange card as it will only pay for her doctor's visits here but also her lab work and her ophthalmology referral. I told her it would also pay for hospitalizations at [facility].

## 2010-12-02 NOTE — Assessment & Plan Note (Signed)
LDL is currently slightly above goal of 100, given that Mandy Mullen has diabetes. Her current LDL of 115 is not terribly over goal and I deferred further management until after dealing with her diabetes today. She is currently taking pravastatin 40 mg daily not the 80 mg daily as prescribed.

## 2010-12-02 NOTE — Telephone Encounter (Signed)
I am totally fine with Novolin 70/30. I changed the order at her clinic visit today.

## 2010-12-02 NOTE — Progress Notes (Signed)
Subjective:   Patient ID: Mandy Mullen female   DOB: 1950-08-15 60 y.o.   MRN: [id number]  HPI: Mandy Mullen is a 60 y.o. woman with past medical history significant for diabetes mellitus diagnosed about 6 years ago last hemoglobin A1c of 11.2, hypertension and hyperlipidemia. She presents today for routine followup of her diabetes.  Mandy Mullen says that the past 6 months have been very difficult for her. She states she only checks her blood glucose about once in the morning if at all. She states she uses an Accu-Chek glucometer and that the strips are approximately $100 per month.   She is currently taking 40 units of Novolin 70/30 in the morning and 30 units in the evening. She states she sometimes gets nighttime hypoglycemia with the most recent episode being last week with a low 46. She states she will get nighttime hypoglycemia if she takes her second dose of metformin after 4 PM in the afternoon. She states she feels nauseated and nervous. She does know to take her evening dose of insulin before meals. However she eats dinner very late at approximately 10 PM when she gets home. When she has hypoglycemia at night she eats a lot of candy and states this has increased her blood glucose. She states her average blood glucose is in the 130 to 150 range    Past Medical History  Diagnosis Date  . Diabetes mellitus   . Hypertension   . MRSA (methicillin resistant Staphylococcus aureus)     Reccurent MRSA abscesses  . Hyperlipidemia   . History of herpes genitalis   . Conjunctivitis of left eye     h/o   Current Outpatient Prescriptions  Medication Sig Dispense Refill  . lisinopril (PRINIVIL,ZESTRIL) 40 MG tablet Take 1 tablet (40 mg total) by mouth daily.  30 tablet  3  . metFORMIN (GLUCOPHAGE) 1000 MG tablet Take 1 tablet (1,000 mg total) by mouth 2 (two) times daily with a meal.  60 tablet  3  . pravastatin (PRAVACHOL) 40 MG tablet Take 2 tablets (80 mg total) by mouth daily.   30 tablet  3  . insulin NPH-insulin regular (NOVOLIN 70/30 RELION) (70-30) 100 UNIT/ML injection Take 50 units 30 minutes before breakfast. Take 20 units 30 minutes before dinner  10 mL  12   Family History  Problem Relation Age of Onset  . Coronary artery disease Father   . Diabetes Sister   . Stroke Sister    History   Social History  . Marital Status: Single    Spouse Name: N/A    Number of Children: N/A  . Years of Education: N/A   Social History Main Topics  . Smoking status: Never Smoker   . Smokeless tobacco: None  . Alcohol Use: No  . Drug Use: No  . Sexually Active: None   Other Topics Concern  . None   Social History Narrative   Currently in school, studying nursing, will be done in 2 yearsSingleLives with son in [place] Exercise- yes- trying to go to [facility] assistance application initiated.  Patient needs to submit further paperwork to complete per Rudell Cobb 07/29/09.Financial assistance approved for 100% discount at [facility] and has [facility] card per [employer] 07/06/09.   Review of Systems: Denies fevers chills, chest pain, lightheadedness, shortness of breath, diarrhea, nausea or vomiting. Endorses symptoms of hypoglycemia as above.  Objective:  Physical Exam: Filed Vitals:   12/02/10 1505  BP: 142/85  Pulse: 80  Temp: 98.8  F (37.1 C)  TempSrc: Oral  Height: 5\' 3"  (1.6 m)  Weight: 195 lb 3.2 oz (88.542 kg)   Constitutional: Vital signs reviewed.  Patient is an obese woman in no acute distress and cooperative with exam. Alert and oriented x3.  Head: Normocephalic and atraumatic Mouth: no erythema or exudates, MMM. Poor dentition with many missing teeth. Eyes: PERRL, EOMI,  Cardiovascular: RRR, S1 normal, S2 normal, no MRG,  Pulmonary/Chest: CTAB, no wheezes, rales, or rhonchi Abdominal: Soft. Non-tender, non-distended, bowel sounds are normal,  Musculoskeletal: No joint deformities, erythema, or stiffness, ROM full and no nontender. The  fifth toe of her left foot is slightly swollen but is nonpainful to palpation and has full range of motion.  Neurological: A&O x3, moves all 4 extremities without difficulty. Cranial nerve II-XII are grossly intact, sensory intact to light touch in the lower extremities bilaterally.  Skin: Warm, dry and intact. No rash, cyanosis, or clubbing.  the skin of the feet is also without defect. The fifth toe is slightly hyperpigmented.  Psychiatric: Normal mood and affect. speech and behavior is normal. Judgment and thought content normal. Cognition and memory are normal.    Lipid Panel     Component Value Date/Time   CHOL 182 06/21/2010 1213   TRIG 100 06/21/2010 1213   HDL 47 06/21/2010 1213   CHOLHDL 3.9 06/21/2010 1213   VLDL 20 06/21/2010 1213   LDLCALC 115* 06/21/2010 1213    Lab Results  Component Value Date   HGBA1C 11.2 12/02/2010   CBG (last 3)   Basename 12/02/10 1515  GLUCAP 366*   .   Assessment & Plan:

## 2010-12-02 NOTE — Patient Instructions (Addendum)
It is imperative that you get the orange card again. This will pay for your doctor's visits in our clinic, some ophthalmology visits and for your lab work. It will also pay for hospitalizations the [facility].  Please check your blood sugar before breakfast and dinner. Increase her insulin to 50 units in the morning before breakfast and decrease your nighttime dose to 20 units before dinner.  3 days before your next clinic appointment please check your blood sugar before and after every meal and at bedtime. Then bring your meter to your clinic appointment so we can download the information.   Return to clinic to see Dr. Candy Sledge in 5 weeks Please bring all your medications to your next clinic appointment.

## 2010-12-03 NOTE — Progress Notes (Signed)
Dr Candy Sledge discussed [name] with me and I agree with his assessment and plan as detailed in his note. Agree that current focus should be on DM. HTN and HLD can be addressed at a future visit. Early F/U.

## 2011-02-10 ENCOUNTER — Other Ambulatory Visit: Payer: Self-pay | Admitting: Internal Medicine

## 2011-02-10 DIAGNOSIS — E119 Type 2 diabetes mellitus without complications: Secondary | ICD-10-CM

## 2011-02-10 MED ORDER — INSULIN NPH ISOPHANE & REGULAR (70-30) 100 UNIT/ML ~~LOC~~ SUSP: SUBCUTANEOUS | Status: DC

## 2011-07-11 ENCOUNTER — Other Ambulatory Visit: Payer: Self-pay | Admitting: *Deleted

## 2011-07-11 DIAGNOSIS — E119 Type 2 diabetes mellitus without complications: Secondary | ICD-10-CM

## 2011-07-11 MED ORDER — LISINOPRIL 40 MG PO TABS: 40.0000 mg | ORAL_TABLET | Freq: Every day | ORAL | Status: DC

## 2011-07-11 MED ORDER — PRAVASTATIN SODIUM 40 MG PO TABS: 80.0000 mg | ORAL_TABLET | Freq: Every day | ORAL | Status: DC

## 2011-07-11 NOTE — Telephone Encounter (Signed)
Pravastatin dose was increased 06/2010 2/2 uncontrolled LDL. Pt last seen Nov 2012 and nothing controlled. Pt was also to work on getting the orange card. She need to get the orange card and then make appt. She has two months to do this. I gave her 2 months worth of meds.

## 2011-07-11 NOTE — Telephone Encounter (Signed)
Refill request for Pravastatin is 40mg  - take 1 tab daily not 2 tabs.

## 2011-07-11 NOTE — Telephone Encounter (Signed)
Invalid telephone # on chart; message sent to front office to see if they can get in contact w/pt.

## 2011-07-27 ENCOUNTER — Other Ambulatory Visit: Payer: Self-pay | Admitting: *Deleted

## 2011-07-27 DIAGNOSIS — E119 Type 2 diabetes mellitus without complications: Secondary | ICD-10-CM

## 2011-07-27 MED ORDER — METFORMIN HCL 1000 MG PO TABS: 1000.0000 mg | ORAL_TABLET | Freq: Two times a day (BID) | ORAL | Status: DC

## 2011-07-27 NOTE — Telephone Encounter (Signed)
Pt last seen nov and never given a F/U appt. Her DM is horrible. Pls sch appt ASAP any provider.

## 2011-07-27 NOTE — Telephone Encounter (Signed)
Message sent to front desk to sched pt an appt. 

## 2011-08-16 ENCOUNTER — Encounter: Payer: Self-pay | Admitting: Internal Medicine

## 2011-09-27 ENCOUNTER — Ambulatory Visit (INDEPENDENT_AMBULATORY_CARE_PROVIDER_SITE_OTHER): Payer: Self-pay | Admitting: Internal Medicine

## 2011-09-27 ENCOUNTER — Encounter: Payer: Self-pay | Admitting: Internal Medicine

## 2011-09-27 VITALS — BP 127/79 | HR 72 | Temp 98.0°F | Ht 63.0 in | Wt 196.3 lb

## 2011-09-27 DIAGNOSIS — Z79899 Other long term (current) drug therapy: Secondary | ICD-10-CM

## 2011-09-27 DIAGNOSIS — E785 Hyperlipidemia, unspecified: Secondary | ICD-10-CM

## 2011-09-27 DIAGNOSIS — I10 Essential (primary) hypertension: Secondary | ICD-10-CM

## 2011-09-27 DIAGNOSIS — Z299 Encounter for prophylactic measures, unspecified: Secondary | ICD-10-CM

## 2011-09-27 DIAGNOSIS — E119 Type 2 diabetes mellitus without complications: Secondary | ICD-10-CM

## 2011-09-27 LAB — COMPLETE METABOLIC PANEL WITH GFR
ALT: 18 U/L (ref 0–35)
AST: 17 U/L (ref 0–37)
Alkaline Phosphatase: 89 U/L (ref 39–117)
Chloride: 106 mEq/L (ref 96–112)
Creat: 0.95 mg/dL (ref 0.50–1.10)
Total Bilirubin: 0.3 mg/dL (ref 0.3–1.2)

## 2011-09-27 LAB — CBC
MCV: 86.8 fL (ref 78.0–100.0)
Platelets: 396 10*3/uL (ref 150–400)
RDW: 14.2 % (ref 11.5–15.5)
WBC: 9.3 10*3/uL (ref 4.0–10.5)

## 2011-09-27 LAB — GLUCOSE, CAPILLARY: Glucose-Capillary: 79 mg/dL (ref 70–99)

## 2011-09-27 LAB — LIPID PANEL: Total CHOL/HDL Ratio: 3.4 Ratio

## 2011-09-27 LAB — POCT GLYCOSYLATED HEMOGLOBIN (HGB A1C): Hemoglobin A1C: 10.9

## 2011-09-27 MED ORDER — INSULIN NPH ISOPHANE & REGULAR (70-30) 100 UNIT/ML ~~LOC~~ SUSP: SUBCUTANEOUS | Status: DC

## 2011-09-27 NOTE — Assessment & Plan Note (Signed)
LDL today 115.  On pravastatin.    -Continue pravastatin -Counseled on diabetes diet, exercise -Continue to monitor

## 2011-09-27 NOTE — Assessment & Plan Note (Signed)
Referred for orange card

## 2011-09-27 NOTE — Assessment & Plan Note (Signed)
Blood pressure today 127/79.    -Continue lisinopril -Continue to monitor

## 2011-09-27 NOTE — Progress Notes (Signed)
Subjective:   Patient ID: [place] KREKEL female   DOB: November 09, 1950 61 y.o.   MRN: [id number]  HPI: [name] is a 61 y.o. African American female with past medical history of uncontrolled diabetes mellitus type 2, hypertension, history of recurrent MRSA abscesses, hyperlipidemia presenting to the clinic today for routine followup visit.  [name] claims to be adherent with her medications, and has recently started a new job that allows her to afford her medications better.  She reports being able to control her diet better now that she can afford to go home cooked meals.  Her A1c was found to be elevated today at 10.9.  She claims to be exercising more and has joined a gym.  She has seen our diabetes coordinator in the past and initially did not want to see her again but after talking further has agreed to see Lupita Leash in the next future visit along with following up in clinic.  She has been counseled extensively on diabetes education, diet control, exercise, weight loss, regular glucose monitoring.  She reports occasionally having lows and highs, more commonly highs. 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  Otherwise she has no major complaints at this time. She denies any nausea, vomiting, fever, chills, headaches, chest pain, shortness of breath, abdominal pain, or any urinary complaints at this time.  Past Medical History  Diagnosis Date  . Diabetes mellitus   . Hypertension   . MRSA (methicillin resistant Staphylococcus aureus)     Reccurent MRSA abscesses  . Hyperlipidemia   . History of herpes genitalis   . Conjunctivitis of left eye     h/o   Current Outpatient Prescriptions  Medication Sig Dispense Refill  . insulin NPH-insulin regular (NOVOLIN 70/30 RELION) (70-30) 100 UNIT/ML injection Take 30 units 30 minutes before breakfast. Take 30 units 30 minutes before dinner  30 mL  5  . lisinopril (PRINIVIL,ZESTRIL) 40 MG tablet Take 1 tablet (40  mg total) by mouth daily.  30 tablet  1  . metFORMIN (GLUCOPHAGE) 1000 MG tablet Take 1 tablet (1,000 mg total) by mouth 2 (two) times daily with a meal.  60 tablet  0  . pravastatin (PRAVACHOL) 40 MG tablet Take 2 tablets (80 mg total) by mouth daily.  60 tablet  1  . DISCONTD: insulin NPH-insulin regular (NOVOLIN 70/30 RELION) (70-30) 100 UNIT/ML injection Take 50 units 30 minutes before breakfast. Take 20 units 30 minutes before dinner  30 mL  5   Family History  Problem Relation Age of Onset  . Coronary artery disease Father   . Diabetes Sister   . Stroke Sister    History   Social History  . Marital Status: Single    Spouse Name: N/A    Number of Children: N/A  . Years of Education: N/A   Social History Main Topics  . Smoking status: Never Smoker   . Smokeless tobacco: None  . Alcohol Use: No  . Drug Use: No  . Sexually Active: None   Other Topics Concern  . None   Social History Narrative   Currently in school, studying nursing, will be done in 2 yearsSingleLives with son in [place] Exercise- yes- trying to go to [facility] assistance application initiated.  Patient needs to submit further paperwork to complete per Rudell Cobb 07/29/09.Financial assistance approved for 100% discount at [facility] and has [facility] card per [employer] 07/06/09.   Review of Systems: Constitutional: Denies fever,  chills, diaphoresis, appetite change and fatigue.  HEENT: Denies photophobia, eye pain, redness, hearing loss, ear pain, congestion, sore throat, rhinorrhea, sneezing, mouth sores, trouble swallowing, neck pain, neck stiffness and tinnitus.   Respiratory: Denies SOB, DOE, cough, chest tightness,  and wheezing.   Cardiovascular: Denies chest pain, palpitations and leg swelling.  Gastrointestinal: Denies nausea, vomiting, abdominal pain, diarrhea, constipation, blood in stool and abdominal distention.  Genitourinary: Denies dysuria, urgency, frequency, hematuria, flank pain and  difficulty urinating.  Musculoskeletal: Denies myalgias, back pain, joint swelling, arthralgias and gait problem.  Skin: Denies pallor, rash and wound.  Neurological: Denies dizziness, seizures, syncope, weakness, light-headedness, numbness and headaches.  Hematological: Denies adenopathy. Easy bruising, personal or family bleeding history  Psychiatric/Behavioral: Denies suicidal ideation, mood changes, confusion, nervousness, sleep disturbance and agitation  Objective:  Physical Exam: Filed Vitals:   09/27/11 1125  BP: 127/79  Pulse: 72  Temp: 98 F (36.7 C)  TempSrc: Oral  Height: 5\' 3"  (1.6 m)  Weight: 196 lb 4.8 oz (89.041 kg)  SpO2: 99%   Constitutional: Vital signs reviewed.  Patient is a well-developed and well-nourished female in no acute distress and cooperative with exam. Alert and oriented x3.  Head: Normocephalic and atraumatic Ear: TM normal bilaterally Mouth: no erythema or exudates, MMM Eyes: PERRLA, EOMI, conjunctivae normal, No scleral icterus.  Neck: Supple, Trachea midline normal ROM Cardiovascular: RRR, S1 normal, S2 normal, no MRG, pulses symmetric and intact bilaterally Pulmonary/Chest: CTAB, no wheezes, rales, or rhonchi Abdominal: Soft. Obese, Non-tender, non-distended, bowel sounds are normal, no masses, organomegaly, or guarding present.  GU: no CVA tenderness Musculoskeletal: No joint deformities, erythema, or stiffness, ROM full and no nontender Hematology: no cervical, inginal, or axillary adenopathy.  Neurological: A&O x3, Strength is normal and symmetric bilaterally, cranial nerve II-XII are grossly intact, no focal motor deficit, sensory intact to light touch bilaterally.  Skin: Warm, dry and intact. No rash, cyanosis, or clubbing.  Psychiatric: Normal mood and affect. speech and behavior is normal. Judgment and thought content normal. Cognition and memory are normal.   Assessment & Plan:

## 2011-09-27 NOTE — Patient Instructions (Signed)
Please take your new adjusted novolin dose of 30 units before breakfast and 30 units before dinner  Return to clinic in one month for follow up  Keep track of blood sugars and bring recordings and monitor in next visit  Work on diet control  Visit with Lupita Leash for diabetes education  Rudell Cobb for financial assistance

## 2011-09-27 NOTE — Assessment & Plan Note (Signed)
Hemoglobin A1c 10.9 today.  Was on Novolin 70/30 35 units in a.m. And 25 units in p.m. and metformin 1000 mg twice a day.  Counseled extensively on diabetes management, diet control, glucose control.  Requested [name] to keep a food diary and record of glucose monitoring and to bring in monitor on next visit.  Reports working at night and sleeping during the day.  Reports lows as low as 40s early mornings at times.  -Continue Novolin dose to 30 units in a.m. and p.m.  she works during the nighttime and is sleep during the day -Regular glucose monitoring, follow up record of blood glucose checks -Asked to bring monitor to next visit along with a food diary -Follow up with Lupita Leash the diabetes coordinator during next visit -Continue weight loss is advised -Continue exercise as advised -Continue diabetic diet as advised

## 2011-09-28 LAB — MICROALBUMIN / CREATININE URINE RATIO: Microalb, Ur: 0.5 mg/dL (ref 0.00–1.89)

## 2011-09-29 NOTE — Progress Notes (Signed)
INTERNAL MEDICINE TEACHING ATTENDING ADDENDUM - Jonah Blue, DO : I personally saw and evaluated Mandy Mullen,  in this clinic visit in conjunction with the resident, Dr. Virgina Organ. I have discussed patient's plan of care with medical resident during this visit. I have confirmed the physical exam findings and have read and agree with the clinic note including the plan.

## 2011-10-04 ENCOUNTER — Other Ambulatory Visit: Payer: Self-pay | Admitting: Internal Medicine

## 2011-10-06 ENCOUNTER — Other Ambulatory Visit: Payer: Self-pay | Admitting: *Deleted

## 2011-10-06 DIAGNOSIS — E119 Type 2 diabetes mellitus without complications: Secondary | ICD-10-CM

## 2011-10-07 MED ORDER — PRAVASTATIN SODIUM 40 MG PO TABS: 80.0000 mg | ORAL_TABLET | Freq: Every day | ORAL | Status: DC

## 2011-10-07 MED ORDER — INSULIN NPH ISOPHANE & REGULAR (70-30) 100 UNIT/ML ~~LOC~~ SUSP: SUBCUTANEOUS | Status: DC

## 2011-11-15 ENCOUNTER — Encounter: Payer: Self-pay | Admitting: Internal Medicine

## 2011-11-15 ENCOUNTER — Ambulatory Visit (INDEPENDENT_AMBULATORY_CARE_PROVIDER_SITE_OTHER): Payer: Self-pay | Admitting: Internal Medicine

## 2011-11-15 VITALS — BP 137/80 | HR 72 | Temp 98.4°F | Ht 63.0 in | Wt 198.0 lb

## 2011-11-15 DIAGNOSIS — I10 Essential (primary) hypertension: Secondary | ICD-10-CM

## 2011-11-15 DIAGNOSIS — E785 Hyperlipidemia, unspecified: Secondary | ICD-10-CM

## 2011-11-15 DIAGNOSIS — E119 Type 2 diabetes mellitus without complications: Secondary | ICD-10-CM

## 2011-11-15 LAB — GLUCOSE, CAPILLARY: Glucose-Capillary: 63 mg/dL — ABNORMAL LOW (ref 70–99)

## 2011-11-15 NOTE — Progress Notes (Signed)
1546 - CBG 64; asymptomatic; OJ and graham crackers given, MD made awared. Stated ate lunch aound 1100AM. 1612 - CBG 63. OJ given; crackers to take home.

## 2011-11-15 NOTE — Patient Instructions (Signed)
Please see Jaynee Eagles for orange card once your taxes are complete and come back for follow up appointment in January.  Continue to check your sugars regularly and PLEASE BRING IN YOUR MONITOR ON NEXT VISIT  PLEASE SEE DONNA PLYLER FOR DIABETES EDUCATION  Please continue to take your novolin dose of 30 units before breakfast and 30 units before dinner and continue the rest of your medications as prescribed.

## 2011-11-19 NOTE — Assessment & Plan Note (Signed)
Last Hb A1C 10.9 09/2011.  Did not bring meter to visit or food diary.  Still does not have orange card, but needs to work on taxes which she will do soon before getting the card.  Claims to be more adherent with medication and working on diet.  Denies any more hypoglycemic episodes.  Endorses taking Novolin 35 units in AM and 30 units in PM, even though prescribed amount is 30 units in AM and PM.    -continue current regimen -f/u donna plyler once orange card established.   -continue to exercise and weight loss and portion control

## 2011-11-19 NOTE — Progress Notes (Signed)
Subjective:   Patient ID: Mandy Mullen female   DOB: 12/05/1950 61 y.o.   MRN: [id number]  HPI: Mandy Mullen is a 61 y.o. african [place] female with PMH of DM, HTN, HL, and recurrent MRSA abscesses who presents to clinic today for routine follow up.  Mandy Mullen did not bring in her glucose meter but claims she has been more adherent with her medications and continues to work on her diet and exercise.  She endorses taking novolin 35 units in AM and 30units in PM claiming she forgot that her prescribed dose was 30 units in both AM and PM.  She also continues her metformin.  Her medication scheduled now works well with her work schedule since she works at night.  She denies any more hypoglycemic episodes and claims to check her sugar regularly at home averaging 120-140s.  She has no major complaints at this time.  She denies any fever, chills, N/V/D, headaches, chest pain, shortness of breath, abdominal pain, or any urinary complaints at this time.  She is working on getting her orange card and can only do that after she completes her taxes which she plans to do next week.  She will then schedule follow up visit and see donna player.  Of note, Mandy Mullen's CBG in clinic today was CBG 64 but asymptomatic, no chills, shaking, fatigue, thirst, headaches, or pain endorsed.  She had not had lunch yet and had gotten off night shift, she usually eats once at home.  She was given orange juice and graham crackers in clinc, repeat cbg 60s, given more crackers.  Feeling fine, said she will eat good meal at home.    Past Medical History  Diagnosis Date  . Diabetes mellitus   . Hypertension   . MRSA (methicillin resistant Staphylococcus aureus)     Reccurent MRSA abscesses  . Hyperlipidemia   . History of herpes genitalis   . Conjunctivitis of left eye     h/o   Current Outpatient Prescriptions  Medication Sig Dispense Refill  . insulin NPH-insulin regular (NOVOLIN 70/30 RELION) (70-30) 100 UNIT/ML  injection Take 30 units 30 minutes before breakfast. Take 30 units 30 minutes before dinner  30 mL  5  . lisinopril (PRINIVIL,ZESTRIL) 40 MG tablet TAKE ONE TABLET BY MOUTH EVERY DAY  30 tablet  11  . metFORMIN (GLUCOPHAGE) 1000 MG tablet TAKE ONE TABLET BY MOUTH TWICE DAILY WITH  A  MEAL  60 tablet  11  . pravastatin (PRAVACHOL) 40 MG tablet Take 2 tablets (80 mg total) by mouth daily.  60 tablet  5   Family History  Problem Relation Age of Onset  . Coronary artery disease Father   . Diabetes Sister   . Stroke Sister    History   Social History  . Marital Status: Single    Spouse Name: N/A    Number of Children: N/A  . Years of Education: N/A   Social History Main Topics  . Smoking status: Never Smoker   . Smokeless tobacco: None  . Alcohol Use: No  . Drug Use: No  . Sexually Active: None   Other Topics Concern  . None   Social History Narrative   Currently in school, studying nursing, will be done in 2 yearsSingleLives with son in [place] Exercise- yes- [facility] to [facility] assistance application initiated.  Patient needs to submit further paperwork to complete per Rudell Cobb 07/29/09.Financial assistance approved for 100% discount at [facility] and has [facility]  card per Rudell Cobb 07/06/09.   Review of Systems: Constitutional: Denies fever, chills, diaphoresis, appetite change and fatigue.  HEENT: Denies photophobia, eye pain, redness, hearing loss, ear pain, congestion, sore throat, rhinorrhea, sneezing, mouth sores, trouble swallowing, neck pain, neck stiffness and tinnitus.   Respiratory: Denies SOB, DOE, cough, chest tightness,  and wheezing.   Cardiovascular: Denies chest pain, palpitations and leg swelling.  Gastrointestinal: Denies nausea, vomiting, abdominal pain, diarrhea, constipation, blood in stool and abdominal distention.  Genitourinary: Denies dysuria, urgency, frequency, hematuria, flank pain and difficulty urinating.  Musculoskeletal: Denies  myalgias, back pain, joint swelling, arthralgias and gait problem.  Skin: Denies pallor, rash and wound.  Neurological: Denies dizziness, seizures, syncope, weakness, light-headedness, numbness and headaches.  Hematological: Denies adenopathy. Easy bruising, personal or family bleeding history  Psychiatric/Behavioral: Denies suicidal ideation, mood changes, confusion, nervousness, sleep disturbance and agitation  Objective:  Physical Exam: Filed Vitals:   11/15/11 1539  BP: 137/80  Pulse: 72  Temp: 98.4 F (36.9 C)  TempSrc: Oral  Height: 5\' 3"  (1.6 m)  Weight: 198 lb (89.812 kg)  SpO2: 98%   Constitutional: Vital signs reviewed.  Patient is a well-developed and well-nourished female in no acute distress and cooperative with exam. Alert and oriented x3.  Head: Normocephalic and atraumatic Ear: TM normal bilaterally Mouth: no erythema or exudates, MMM Eyes: PERRLA, EOMI, conjunctivae normal, No scleral icterus.  Neck: Supple, Trachea midline normal ROM, No JVD, mass, thyromegaly, or carotid bruit present.  Cardiovascular: RRR, S1 normal, S2 normal, no MRG, pulses symmetric and intact bilaterally Pulmonary/Chest: CTAB, no wheezes, rales, or rhonchi Abdominal: Soft. Non-tender, non-distended, +bowel sounds are normal, no masses, organomegaly, or guarding present.  GU: no CVA tenderness Musculoskeletal: No joint deformities, erythema, or stiffness, ROM full and no nontender Neurological: A&O x3, Strength is normal and symmetric bilaterally, cranial nerve II-XII are grossly intact, no focal motor deficit, sensory intact to light touch bilaterally.  Skin: Warm, dry and intact. No rash, cyanosis, or clubbing.  Psychiatric: Normal mood and affect. speech and behavior is normal. Judgment and thought content normal. Cognition and memory are normal.   Assessment & Plan:  Case Discussed with Dr. Rogelia Boga

## 2011-11-19 NOTE — Assessment & Plan Note (Signed)
bp 137/80 during visit.    -continue lisinopril

## 2011-11-19 NOTE — Assessment & Plan Note (Signed)
Continue pravastatin 80 mg daily.  

## 2011-11-20 NOTE — Progress Notes (Signed)
I saw, examined, and discussed the patient with Dr Quershi and agree with the note contained here.  

## 2012-03-29 ENCOUNTER — Encounter: Payer: Self-pay | Admitting: Internal Medicine

## 2012-04-09 ENCOUNTER — Ambulatory Visit (INDEPENDENT_AMBULATORY_CARE_PROVIDER_SITE_OTHER): Payer: PRIVATE HEALTH INSURANCE | Admitting: Internal Medicine

## 2012-04-09 ENCOUNTER — Encounter: Payer: Self-pay | Admitting: Internal Medicine

## 2012-04-09 VITALS — BP 142/79 | HR 75 | Temp 98.0°F | Ht 63.0 in | Wt 196.3 lb

## 2012-04-09 DIAGNOSIS — E119 Type 2 diabetes mellitus without complications: Secondary | ICD-10-CM

## 2012-04-09 DIAGNOSIS — D692 Other nonthrombocytopenic purpura: Secondary | ICD-10-CM

## 2012-04-09 DIAGNOSIS — L282 Other prurigo: Secondary | ICD-10-CM | POA: Insufficient documentation

## 2012-04-09 LAB — CBC WITH DIFFERENTIAL/PLATELET
Basophils Absolute: 0 10*3/uL (ref 0.0–0.1)
Lymphocytes Relative: 41 % (ref 12–46)
Lymphs Abs: 2.7 10*3/uL (ref 0.7–4.0)
Neutrophils Relative %: 47 % (ref 43–77)
Platelets: 317 10*3/uL (ref 150–400)
RBC: 4.25 MIL/uL (ref 3.87–5.11)
WBC: 6.6 10*3/uL (ref 4.0–10.5)

## 2012-04-09 LAB — SEDIMENTATION RATE: Sed Rate: 43 mm/hr — ABNORMAL HIGH (ref 0–22)

## 2012-04-09 LAB — COMPREHENSIVE METABOLIC PANEL
ALT: 18 U/L (ref 0–35)
AST: 13 U/L (ref 0–37)
CO2: 28 mEq/L (ref 19–32)
Calcium: 9.7 mg/dL (ref 8.4–10.5)
Chloride: 107 mEq/L (ref 96–112)
Sodium: 144 mEq/L (ref 135–145)
Total Bilirubin: 0.2 mg/dL — ABNORMAL LOW (ref 0.3–1.2)
Total Protein: 6.9 g/dL (ref 6.0–8.3)

## 2012-04-09 LAB — PROTIME-INR: Prothrombin Time: 12.6 seconds (ref 11.6–15.2)

## 2012-04-09 MED ORDER — DIPHENHYDRAMINE HCL 25 MG PO TABS: 25.0000 mg | ORAL_TABLET | Freq: Four times a day (QID) | ORAL | Status: DC | PRN

## 2012-04-09 NOTE — Progress Notes (Signed)
Subjective:   Patient ID: Mandy Mullen female   DOB: [birth year] 62 y.o.   MRN: [id number]  HPI: [name] is a 62 y.o. woman who presents to clinic today complaining of a rash "all over" since Saturday.  She states that she is concerned because she has a "history of MRSA" infection.  She states that it was a sudden onset at work on Saturday and that the rash is very intense.  She noted a "hot flash" and then the onset of the rash and itching.  She states that she also had nausea on Saturday and vomited once.  She has taken benadryl over the last day or so which does help some.  She denies any changes in her detergent, perfume, fabric softener, medications, or diet over the last few weeks.  She denies bleeding, headache, confusion, chest pain, or abdominal pain.   Past Medical History  Diagnosis Date  . Diabetes mellitus   . Hypertension   . MRSA (methicillin resistant Staphylococcus aureus)     Reccurent MRSA abscesses  . Hyperlipidemia   . History of herpes genitalis   . Conjunctivitis of left eye     h/o   Current Outpatient Prescriptions  Medication Sig Dispense Refill  . insulin NPH-insulin regular (NOVOLIN 70/30 RELION) (70-30) 100 UNIT/ML injection Take 30 units 30 minutes before breakfast. Take 30 units 30 minutes before dinner  30 mL  5  . lisinopril (PRINIVIL,ZESTRIL) 40 MG tablet TAKE ONE TABLET BY MOUTH EVERY DAY  30 tablet  11  . metFORMIN (GLUCOPHAGE) 1000 MG tablet TAKE ONE TABLET BY MOUTH TWICE DAILY WITH  A  MEAL  60 tablet  11  . pravastatin (PRAVACHOL) 40 MG tablet Take 2 tablets (80 mg total) by mouth daily.  60 tablet  5   No current facility-administered medications for this visit.   Family History  Problem Relation Age of Onset  . Coronary artery disease Father   . Diabetes Sister   . Stroke Sister    History   Social History  . Marital Status: Single    Spouse Name: N/A    Number of Children: N/A  . Years of Education: N/A   Social History  Main Topics  . Smoking status: Never Smoker   . Smokeless tobacco: None  . Alcohol Use: No  . Drug Use: No  . Sexually Active: None   Other Topics Concern  . None   Social History Narrative   Currently in school, studying nursing, will be done in 2 years   Single   Lives with son in [place]   Regular Exercise- yes- trying to go to [facility]                  Financial assistance application initiated.  Patient needs to submit further paperwork to complete per Rudell Cobb 07/29/09.   Financial assistance approved for 100% discount at [facility] and has [facility] card per [employer] 07/06/09.   Review of Systems: Constitutional: Denies fever, chills, diaphoresis, appetite change and fatigue.  HEENT: Denies photophobia, eye pain, redness, hearing loss, ear pain, congestion, sore throat, rhinorrhea, sneezing, mouth sores, trouble swallowing, neck pain, neck stiffness and tinnitus.   Respiratory: Denies SOB, DOE, cough, chest tightness,  and wheezing.   Cardiovascular: Denies chest pain, palpitations and leg swelling.  Gastrointestinal: Denies nausea, vomiting, abdominal pain, diarrhea, constipation, blood in stool and abdominal distention.  Genitourinary: Denies dysuria, urgency, frequency, hematuria, flank pain and difficulty urinating.  Musculoskeletal: Denies myalgias,  back pain, joint swelling, arthralgias and gait problem.  Skin: Positive for rash. Denies pallor, and wound.  Neurological: Denies dizziness, seizures, syncope, weakness, light-headedness, numbness and headaches.  Hematological: Denies adenopathy. Easy bruising, personal or family bleeding history  Psychiatric/Behavioral: Denies suicidal ideation, mood changes, confusion, nervousness, sleep disturbance and agitation  Objective:  Physical Exam: Filed Vitals:   04/09/12 1541  BP: 142/79  Pulse: 75  Temp: 98 F (36.7 C)  TempSrc: Oral  Height: 5\' 3"  (1.6 m)  Weight: 196 lb 4.8 oz (89.041 kg)  SpO2: 100%    Constitutional: Vital signs reviewed.  Patient is a well-developed and well-nourished woman in no acute distress and cooperative with exam. Alert and oriented x3.  Head: Normocephalic and atraumatic Ear: TM normal bilaterally Mouth: no erythema or exudates, MMM, no lip swelling or swelling of the posterior pharynx.  Eyes: PERRL, EOMI, conjunctivae normal, No scleral icterus.  Neck: Supple, Trachea midline normal ROM, No JVD, mass, thyromegaly, or carotid bruit present.  Cardiovascular: RRR, S1 normal, S2 normal, no MRG, pulses symmetric and intact bilaterally Pulmonary/Chest: CTAB, no wheezes, rales, or rhonchi Abdominal: Soft. Non-tender, non-distended, bowel sounds are normal, no masses, organomegaly, or guarding present.  GU: no CVA tenderness Musculoskeletal: No joint deformities, erythema, or stiffness, ROM full and no nontender Hematology: no cervical, inginal, or axillary adenopathy.  Neurological: A&O x3, Strength is normal and symmetric bilaterally, cranial nerve II-XII are grossly intact, no focal motor deficit, sensory intact to light touch bilaterally.  Skin: Warm, dry and intact. There is a scattered purpuric rash over both arms, shoulders, abdomen, and legs.  No involvement of the palms or soles of the feet.  The areas are poorly blanchable and are not palpable.  Many scattered excoriation marks. No cyanosis, or clubbing.  Psychiatric: Normal mood and affect. speech and behavior is normal. Judgment and thought content normal. Cognition and memory are normal.   Assessment & Plan:

## 2012-04-09 NOTE — Patient Instructions (Signed)
1.  Stop in the lab to get your blood drawn.    2.  Continue the benadryl for now.  The over the counter benadryl is 25 mg capsules.  Take 1 every 6 hours for the itching.  3.  Follow up with me in 2-3 days.

## 2012-04-10 LAB — ANA: Anti Nuclear Antibody(ANA): NEGATIVE

## 2012-04-10 LAB — C4 COMPLEMENT: C4 Complement: 43 mg/dL — ABNORMAL HIGH (ref 10–40)

## 2012-04-10 LAB — URINALYSIS, ROUTINE W REFLEX MICROSCOPIC
Hgb urine dipstick: NEGATIVE
Leukocytes, UA: NEGATIVE
Nitrite: NEGATIVE
Urobilinogen, UA: 0.2 mg/dL (ref 0.0–1.0)
pH: 5.5 (ref 5.0–8.0)

## 2012-04-11 ENCOUNTER — Ambulatory Visit (INDEPENDENT_AMBULATORY_CARE_PROVIDER_SITE_OTHER): Payer: PRIVATE HEALTH INSURANCE | Admitting: Internal Medicine

## 2012-04-11 ENCOUNTER — Encounter: Payer: Self-pay | Admitting: Internal Medicine

## 2012-04-11 VITALS — BP 148/82 | HR 69 | Temp 98.7°F | Ht 63.0 in | Wt 198.7 lb

## 2012-04-11 DIAGNOSIS — L299 Pruritus, unspecified: Secondary | ICD-10-CM

## 2012-04-11 DIAGNOSIS — L282 Other prurigo: Secondary | ICD-10-CM

## 2012-04-11 DIAGNOSIS — I10 Essential (primary) hypertension: Secondary | ICD-10-CM

## 2012-04-11 DIAGNOSIS — R21 Rash and other nonspecific skin eruption: Secondary | ICD-10-CM

## 2012-04-11 MED ORDER — AMLODIPINE BESYLATE 10 MG PO TABS: 10.0000 mg | ORAL_TABLET | Freq: Every day | ORAL | Status: DC

## 2012-04-11 NOTE — Patient Instructions (Signed)
1.  Stop the Lisinopril  2.  Continue the Benadryl  3.  Wait 1 week from today and then start the Amlodipine 10 mg tablets.  Take 1 tablet daily for your blood pressure.  4.  Follow up in 2 weeks  - If the swelling gets worse or the rash and the swelling don't get any better please call before you start the new medication.

## 2012-04-11 NOTE — Progress Notes (Signed)
Subjective:   Patient ID: Mandy Mullen female   DOB: [birth year] 62 y.o.   MRN: [id number]  HPI: [name] is a 62 y.o. woman who presents to the clinic today for follow up from her last appointment.  She has been taking the benadryl as prescribed and states that the rash has been less itching and less red but still very noticeable. She states that over the last day or so her lip swelling has gotten worse, especially her lower lip.  She denies problems swallowing, shortness of breath, or stridor.    Past Medical History  Diagnosis Date  . Diabetes mellitus   . Hypertension   . MRSA (methicillin resistant Staphylococcus aureus)     Reccurent MRSA abscesses  . Hyperlipidemia   . History of herpes genitalis   . Conjunctivitis of left eye     h/o   Current Outpatient Prescriptions  Medication Sig Dispense Refill  . diphenhydrAMINE (BENADRYL) 25 MG tablet Take 1 tablet (25 mg total) by mouth every 6 (six) hours as needed for itching.  30 tablet  0  . insulin NPH-insulin regular (NOVOLIN 70/30 RELION) (70-30) 100 UNIT/ML injection Take 30 units 30 minutes before breakfast. Take 30 units 30 minutes before dinner  30 mL  5  . lisinopril (PRINIVIL,ZESTRIL) 40 MG tablet TAKE ONE TABLET BY MOUTH EVERY DAY  30 tablet  11  . metFORMIN (GLUCOPHAGE) 1000 MG tablet TAKE ONE TABLET BY MOUTH TWICE DAILY WITH  A  MEAL  60 tablet  11  . pravastatin (PRAVACHOL) 40 MG tablet Take 2 tablets (80 mg total) by mouth daily.  60 tablet  5   No current facility-administered medications for this visit.   Family History  Problem Relation Age of Onset  . Coronary artery disease Father   . Diabetes Sister   . Stroke Sister    History   Social History  . Marital Status: Single    Spouse Name: N/A    Number of Children: N/A  . Years of Education: N/A   Social History Main Topics  . Smoking status: Never Smoker   . Smokeless tobacco: None  . Alcohol Use: No  . Drug Use: No  . Sexually Active:  None   Other Topics Concern  . None   Social History Narrative   Currently in school, studying nursing, will be done in 2 years   Single   Lives with son in [place]   Regular Exercise- yes- trying to go to [employer]                  Financial assistance application initiated.  Patient needs to submit further paperwork to complete per Rudell Cobb 07/29/09.   Financial assistance approved for 100% discount at [facility] and has [facility] card per [employer] 07/06/09.   Review of Systems: Constitutional: Denies fever, chills, diaphoresis, appetite change and fatigue.  HEENT: Positive for lip swelling. Denies photophobia, eye pain, redness, hearing loss, ear pain, congestion, sore throat, rhinorrhea, sneezing, mouth sores, trouble swallowing, neck pain, neck stiffness and tinnitus.   Respiratory: Denies SOB, DOE, cough, chest tightness,  and wheezing.   Cardiovascular: Denies chest pain, palpitations and leg swelling.  Gastrointestinal: Denies nausea, vomiting, abdominal pain, diarrhea, constipation, blood in stool and abdominal distention.  Genitourinary: Denies dysuria, urgency, frequency, hematuria, flank pain and difficulty urinating.  Musculoskeletal: Denies myalgias, back pain, joint swelling, arthralgias and gait problem.  Skin: Positive for rash.  Denies pallor, and wound.  Neurological: Denies  dizziness, seizures, syncope, weakness, light-headedness, numbness and headaches.  Hematological: Denies adenopathy. Easy bruising, personal or family bleeding history  Psychiatric/Behavioral: Denies suicidal ideation, mood changes, confusion, nervousness, sleep disturbance and agitation  Objective:  Physical Exam: Filed Vitals:   04/11/12 1533  BP: 148/82  Pulse: 69  Temp: 98.7 F (37.1 C)  TempSrc: Oral  Height: 5\' 3"  (1.6 m)  Weight: 198 lb 11.2 oz (90.13 kg)  SpO2: 98%   Constitutional: Vital signs reviewed.  Patient is a well-developed and well-nourished woman in no acute distress and  cooperative with exam. Alert and oriented x3.  Head: Normocephalic and atraumatic Ear: TM normal bilaterally Mouth: moderate swelling of the lips with the lower lip more swollen then the upper lip.  No buccal lesions noted or exfoliation.  no erythema or exudates, MMM Eyes: PERRL, EOMI, conjunctivae normal, No scleral icterus.  Neck: Supple, Trachea midline normal ROM, No JVD, mass, thyromegaly, or carotid bruit present.  Cardiovascular: RRR, S1 normal, S2 normal, no MRG, pulses symmetric and intact bilaterally Pulmonary/Chest: CTAB, no wheezes, rales, or rhonchi Abdominal: Soft. Non-tender, non-distended, bowel sounds are normal, no masses, organomegaly, or guarding present.  GU: no CVA tenderness Musculoskeletal: No joint deformities, erythema, or stiffness, ROM full and no nontender Hematology: no cervical, inginal, or axillary adenopathy.  Neurological: A&O x3, Strength is normal and symmetric bilaterally, cranial nerve II-XII are grossly intact, no focal motor deficit, sensory intact to light touch bilaterally.  Skin: Warm, dry and intact. Scattered purpura noted over the bilateral arms, legs, back and abdomen with no involvement of the palms or soles.  Less erythematous today then previous.  No cyanosis, or clubbing.  Psychiatric: Normal mood and affect. speech and behavior is normal. Judgment and thought content normal. Cognition and memory are normal.   Assessment & Plan:

## 2012-04-16 ENCOUNTER — Encounter: Payer: Self-pay | Admitting: Internal Medicine

## 2012-04-24 ENCOUNTER — Ambulatory Visit (INDEPENDENT_AMBULATORY_CARE_PROVIDER_SITE_OTHER): Payer: PRIVATE HEALTH INSURANCE | Admitting: Internal Medicine

## 2012-04-24 ENCOUNTER — Encounter: Payer: Self-pay | Admitting: Internal Medicine

## 2012-04-24 VITALS — BP 145/81 | HR 98 | Temp 100.1°F | Ht 63.0 in | Wt 195.5 lb

## 2012-04-24 DIAGNOSIS — L282 Other prurigo: Secondary | ICD-10-CM

## 2012-04-24 DIAGNOSIS — I10 Essential (primary) hypertension: Secondary | ICD-10-CM

## 2012-04-24 DIAGNOSIS — E119 Type 2 diabetes mellitus without complications: Secondary | ICD-10-CM

## 2012-04-24 LAB — GLUCOSE, CAPILLARY: Glucose-Capillary: 178 mg/dL — ABNORMAL HIGH (ref 70–99)

## 2012-04-24 NOTE — Assessment & Plan Note (Addendum)
Diffusely scattered on all extremities x3 weeks.  Large surrounding Erythema with central black circular region, non-palpable.  Itchy and painful.  Lip swelling reported as well.  No open wounds or pus drainage.  Hx of MRSA abscesses in the past--2006.    Initially thought to be side effect of long term lisinopril.  Lisinopril stopped but rash continued and no has worsened especially since Saturday.  Denies any change in medications, work, or environment. No recent trauma.  Has had no relief with benadryl daily.  Reports temp of 104F at home on Saturday.  Temp 100.4F today.  -urgent dermatology referral

## 2012-04-24 NOTE — Assessment & Plan Note (Signed)
Lab Results  Component Value Date   HGBA1C 11.6 04/09/2012   HGBA1C 10.9 09/27/2011   HGBA1C 11.2 12/02/2010    Assessment: Diabetes control: poor control (HgbA1C >9%) Progress toward A1C goal:  unable to assess  Plan: Medications:  continue current medications : Metformin and NPH 70/30 Home glucose monitoring: Frequency: 3 times a day Timing:   Instruction/counseling given: reminded to get eye exam, reminded to bring blood glucose meter & log to each visit, reminded to bring medications to each visit, discussed foot care, discussed the need for weight loss and discussed diet Educational resources provided: brochure;handout Self management tools provided:

## 2012-04-24 NOTE — Assessment & Plan Note (Signed)
BP Readings from Last 3 Encounters:  04/24/12 145/81  04/11/12 148/82  04/09/12 142/79   Lab Results  Component Value Date   NA 144 04/09/2012   K 4.1 04/09/2012   CREATININE 1.12* 04/09/2012   Assessment: Blood pressure control: controlled Progress toward BP goal:  at goal  Plan: Medications:  continue current medications--off lisinopril, meant to start norvasc but has not done so due to current rash Educational resources provided: brochure Self management tools provided:

## 2012-04-24 NOTE — Patient Instructions (Signed)
Please go see a dermatologist ASAP  Please continue to check your blood sugar and blood pressure and take your medications  If you notice more fevers and pain or swelling or worsening of rash, call or come to clinic [phone] or go to ED if severe  Rash A rash is a change in the color or texture of your skin. There are many different types of rashes. You may have other problems that accompany your rash. CAUSES   Infections.  Allergic reactions. This can include allergies to pets or foods.  Certain medicines.  Exposure to certain chemicals, soaps, or cosmetics.  Heat.  Exposure to poisonous plants.  Tumors, both cancerous and noncancerous. SYMPTOMS   Redness.  Scaly skin.  Itchy skin.  Dry or cracked skin.  Bumps.  Blisters.  Pain. DIAGNOSIS  Your caregiver may do a physical exam to determine what type of rash you have. A skin sample (biopsy) may be taken and examined under a microscope. TREATMENT  Treatment depends on the type of rash you have. Your caregiver may prescribe certain medicines. For serious conditions, you may need to see a skin doctor (dermatologist). HOME CARE INSTRUCTIONS   Avoid the substance that caused your rash.  Do not scratch your rash. This can cause infection.  You may take cool baths to help stop itching.  Only take over-the-counter or prescription medicines as directed by your caregiver.  Keep all follow-up appointments as directed by your caregiver. SEEK IMMEDIATE MEDICAL CARE IF:  You have increasing pain, swelling, or redness.  You have a fever.  You have new or severe symptoms.  You have body aches, diarrhea, or vomiting.  Your rash is not better after 3 days. MAKE SURE YOU:  Understand these instructions.  Will watch your condition.  Will get help right away if you are not doing well or get worse. Document Released: 12/24/2001 Document Revised: 03/28/2011 Document Reviewed: 10/18/2010 [facility] Patient Information  2013 [place], [place].

## 2012-04-24 NOTE — Progress Notes (Signed)
Extensive rash without evident cause or recognizable pattern.  Agree with referral.

## 2012-04-24 NOTE — Progress Notes (Signed)
Subjective:   Patient ID: Mandy Mullen female   DOB: January 05, 1951 62 y.o.   MRN: [id number]  HPI: [name] is a 62 y.o. african [place] female with poorly controlled DM2, HTN, and HL presenting to clinic today for worsening diffuse pruiritic rash.  She recently saw Dr. Tonny Mullen in clinic x2 in March for same complaints of a pruritic and now erythematous diffuse body rash x3 weeks in duration.  She complains of constant itching and sharp needle like pain.  The rash has central darkened areas with surround erythema and skin peeling.  She does have a history of MRSA abscesses but denies any similar rash like this before.    She also reports temp of 162f at home on Saturday and chills but that has resolved since then with tylenol.  She continues to take benadryl 2-3x daily for minimal relief.  She denies any N/V/D, fever, abdominal pain, chest pain, shortness of breath, headaches, or urinary complaints at this time.   She works in a nursing home, denies any change in her routine, medications, or environment.    Past Medical History  Diagnosis Date  . Diabetes mellitus   . Hypertension   . MRSA (methicillin resistant Staphylococcus aureus)     Reccurent MRSA abscesses  . Hyperlipidemia   . History of herpes genitalis   . Conjunctivitis of left eye     h/o   Current Outpatient Prescriptions  Medication Sig Dispense Refill  . diphenhydrAMINE (BENADRYL) 25 MG tablet Take 1 tablet (25 mg total) by mouth every 6 (six) hours as needed for itching.  30 tablet  0  . insulin NPH-insulin regular (NOVOLIN 70/30 RELION) (70-30) 100 UNIT/ML injection Take 30 units 30 minutes before breakfast. Take 30 units 30 minutes before dinner  30 mL  5  . metFORMIN (GLUCOPHAGE) 1000 MG tablet TAKE ONE TABLET BY MOUTH TWICE DAILY WITH  A  MEAL  60 tablet  11  . pravastatin (PRAVACHOL) 40 MG tablet Take 2 tablets (80 mg total) by mouth daily.  60 tablet  5  . amLODipine (NORVASC) 10 MG tablet Take 1 tablet  (10 mg total) by mouth daily.  30 tablet  11   No current facility-administered medications for this visit.   Family History  Problem Relation Age of Onset  . Coronary artery disease Father   . Diabetes Sister   . Stroke Sister    History   Social History  . Marital Status: Single    Spouse Name: N/A    Number of Children: N/A  . Years of Education: N/A   Social History Main Topics  . Smoking status: Never Smoker   . Smokeless tobacco: None  . Alcohol Use: No  . Drug Use: No  . Sexually Active: None   Other Topics Concern  . None   Social History Narrative   Currently in school, studying nursing, will be done in 2 years   Single   Lives with son in [place]   Regular Exercise- yes- trying to go to [facility]                  Financial assistance application initiated.  Patient needs to submit further paperwork to complete per Rudell Cobb 07/29/09.   Financial assistance approved for 100% discount at [facility] and has [facility] card per [employer] 07/06/09.   Review of Systems: Constitutional: Denies fever, chills, diaphoresis, appetite change and fatigue.  HEENT: Denies photophobia, eye pain, redness, hearing loss, ear pain, congestion,  sore throat, rhinorrhea, sneezing, mouth sores, trouble swallowing, neck pain, neck stiffness and tinnitus.   Respiratory: Denies SOB, DOE, cough, chest tightness,  and wheezing.   Cardiovascular: Denies chest pain, palpitations and leg swelling.  Gastrointestinal: Denies nausea, vomiting, abdominal pain, diarrhea, constipation, blood in stool and abdominal distention.  Genitourinary: Denies dysuria, urgency, frequency, hematuria, flank pain and difficulty urinating.  Musculoskeletal: Denies myalgias, back pain, joint swelling, arthralgias and gait problem.  Skin: Diffuse pruritic and erythematous body rash Neurological: Denies dizziness, seizures, syncope, weakness, light-headedness, numbness and headaches.  Hematological: Denies adenopathy.  Easy bruising, personal or family bleeding history  Psychiatric/Behavioral: Denies suicidal ideation, mood changes, confusion, nervousness, sleep disturbance and agitation  Objective:  Physical Exam: Filed Vitals:   04/24/12 1453  BP: 145/81  Pulse: 98  Temp: 100.1 F (37.8 C)  TempSrc: Oral  Height: 5\' 3"  (1.6 m)  Weight: 195 lb 8 oz (88.678 kg)  SpO2: 98%   General: AAOX3, NAD Head: Normocephalic and atraumatic.  Eyes: Vision grossly intact, PERRLA, EOMI  Mouth: Pharynx pink and moist, no erythema, and no exudates. Edematous lips, bottom>upper.  Neck: Supple, full ROM Lungs: Normal respiratory effort, CTA B/L, no accessory muscle use, no crackles, and no wheezes. Heart: Normal rate, regular rhythm, no murmur, no gallop, and no rub.  Abdomen: Soft, non-tender, normal bowel sounds, non-distended, non-obese Msk: No joint swelling, no joint warmth Pulses: 2+ DP/PT pulses bilaterally Extremities: No cyanosis, clubbing, edema Neurologic: Alert & oriented X3, cranial nerves II-XII intact, strength normal in all extremities, sensation intact to light touch, and gait normal.  Skin: diffuse erythematous and pruritic rash with central black region and surrounding erythema, no open wounds, no oozing.   Psych: Oriented X3, memory intact for recent and remote, normally interactive, good eye contact, not anxious appearing, and not depressed appearing. Assessment & Plan:  Discussed with Dr. Aundria Rud  -skin rash: urgent derm consult--will see dermatology specialists tomorrow

## 2012-05-09 NOTE — Assessment & Plan Note (Signed)
Given that we have to stop her lisinopril due to suspected angioedema we will start her on amlodipine 10 mg daily for BP control.

## 2012-05-09 NOTE — Assessment & Plan Note (Signed)
Office Visit on 04/09/2012  Component Date Value Range Status  . WBC 04/09/2012 6.6  4.0 - 10.5 K/uL Final  . RBC 04/09/2012 4.25  3.87 - 5.11 MIL/uL Final  . Hemoglobin 04/09/2012 12.5  12.0 - 15.0 g/dL Final  . HCT 47/82/9562 37.2  36.0 - 46.0 % Final  . MCV 04/09/2012 87.5  78.0 - 100.0 fL Final  . MCH 04/09/2012 29.4  26.0 - 34.0 pg Final  . MCHC 04/09/2012 33.6  30.0 - 36.0 g/dL Final  . RDW 13/08/6576 14.0  11.5 - 15.5 % Final  . Platelets 04/09/2012 317  150 - 400 K/uL Final  . Neutrophils Relative 04/09/2012 47  43 - 77 % Final  . Neutro Abs 04/09/2012 3.1  1.7 - 7.7 K/uL Final  . Lymphocytes Relative 04/09/2012 41  12 - 46 % Final  . Lymphs Abs 04/09/2012 2.7  0.7 - 4.0 K/uL Final  . Monocytes Relative 04/09/2012 8  3 - 12 % Final  . Monocytes Absolute 04/09/2012 0.5  0.1 - 1.0 K/uL Final  . Eosinophils Relative 04/09/2012 4  0 - 5 % Final  . Eosinophils Absolute 04/09/2012 0.4  0.0 - 0.7 K/uL Final  . Basophils Relative 04/09/2012 0  0 - 1 % Final  . Basophils Absolute 04/09/2012 0.0  0.0 - 0.1 K/uL Final  . Smear Review 04/09/2012 Criteria for review not met   Final  . Sodium 04/09/2012 144  135 - 145 mEq/L Final   Performed at [facility]  . Potassium 04/09/2012 4.1  3.5 - 5.3 mEq/L Final  . Chloride 04/09/2012 107  96 - 112 mEq/L Final  . CO2 04/09/2012 28  19 - 32 mEq/L Final  . Glucose, Bld 04/09/2012 93  70 - 99 mg/dL Final  . BUN 46/96/2952 17  6 - 23 mg/dL Final  . Creat 84/13/2440 1.12* 0.50 - 1.10 mg/dL Final  . Total Bilirubin 04/09/2012 0.2* 0.3 - 1.2 mg/dL Final  . Alkaline Phosphatase 04/09/2012 112  39 - 117 U/L Final  . AST 04/09/2012 13  0 - 37 U/L Final  . ALT 04/09/2012 18  0 - 35 U/L Final  . Total Protein 04/09/2012 6.9  6.0 - 8.3 g/dL Final  . Albumin 11/13/2534 3.5  3.5 - 5.2 g/dL Final  . Calcium 64/40/3474 9.7  8.4 - 10.5 mg/dL Final  . Prothrombin Time 04/09/2012 12.6  11.6 - 15.2 seconds Final  . INR 04/09/2012 0.95  <1.50 Final   Comment: The INR is of principal utility in following patients on stable doses                          of oral anticoagulants.  The therapeutic range is generally 2.0 to                          3.0, but may be 3.0 to 4.0 in patients with mechanical cardiac valves,                          recurrent embolisms and antiphospholipid antibodies (including lupus                          inhibitors).  Marland Kitchen aPTT 04/09/2012 33  24 - 37 seconds Final   Comment: This test is for screening purposes only; it should not be used for  therapeutic unfractionated heparin monitoring.  Please refer to                          Heparin Anti-Xa (78469).  . C3 Complement 04/09/2012 143  90 - 180 mg/dL Final  . C4 Complement 04/09/2012 43* 10 - 40 mg/dL Final  . Compl, Total (CH50) 04/09/2012 >60* 31 - 60 U/mL Final  . Sed Rate 04/09/2012 43* 0 - 22 mm/hr Final  . ANA 04/09/2012 NEG  NEGATIVE Final  . Color, Urine 04/09/2012 YELLOW  YELLOW Final  . APPearance 04/09/2012 CLEAR  CLEAR Final  . Specific Gravity, Urine 04/09/2012 1.025  1.005 - 1.030 Final  . pH 04/09/2012 5.5  5.0 - 8.0 Final  . Glucose, UA 04/09/2012 500* NEG mg/dL Final  . Bilirubin Urine 04/09/2012 NEG  NEG Final  . Ketones, ur 04/09/2012 NEG  NEG mg/dL Final  . Hgb urine dipstick 04/09/2012 NEG  NEG Final  . Protein, ur 04/09/2012 NEG  NEG mg/dL Final  . Urobilinogen, UA 04/09/2012 0.2  0.0 - 1.0 mg/dL Final  . Nitrite 62/95/2841 NEG  NEG Final  . Leukocytes, UA 04/09/2012 NEG  NEG Final  . Hemoglobin A1C 04/09/2012 11.6   Final  . Glucose-Capillary 04/09/2012 82  70 - 99 mg/dL Final   Extensive lab testing was done at her last visit which was remarkable only for its lack of positive features.  She has a mildly elevated sed rate with high levels of complement, normal Plt, PT, PTT, and ANA.  All of this points to possible infectious vs inflammatory reaction.  She has no symptoms of infection like fever, chills, or elevated  WBC count.  With the worsening of the lip swelling and the fact she has been taking lisinopril they may represent angioedema.  We will stop her Lisinopril to see if this helps resolve the problem.  She will continue the benadryl and return in about 2 weeks to see how she is doing.

## 2012-05-09 NOTE — Assessment & Plan Note (Signed)
Lab Results  Component Value Date   HGBA1C 11.6 04/09/2012   HGBA1C 10.9 09/27/2011   HGBA1C 11.2 12/02/2010     Assessment: Diabetes control: poor control (HgbA1C >9%) Progress toward A1C goal:  unchanged Comments: A1C done today shows her diabetes is out of control. She is on 70/30 insulin 30 units BID.  She has never had great control but today has a more pressing matter to attend to first.  At her next visit if her rash is better we will work on her diabetes control.  Plan: Medications:  continue current medications Home glucose monitoring: Frequency: 3 times a day Timing:   Instruction/counseling given: reminded to bring medications to each visit and discussed diet Educational resources provided: handout Self management tools provided:   Other plans:

## 2012-05-09 NOTE — Assessment & Plan Note (Addendum)
With the scattered rash and the fast that they are not blanchable raises concerns for purpura.  Differential diagnosis includes antiphospholipid syndrome, TTP, or vasculitis.  Given that they are not palpable and she has no arthropathy or systemic symptoms other then nausea makes TTP/HUS or HSP less likely.  Drug reaction is also in the possibility including angioedema from ACEI though there is no lip or pharyngeal swelling which is the classical.  We will start with testing for autoimmune/vasculitis and TTP which are the most dangerous.  C3, C4, ANA, CBC with diff, complement levels, Cmet, PT, PTT, Sed rate, and UA.  She will start benadryl 25 mg q6 and come back in 2-3 days to review the test results.

## 2012-07-17 ENCOUNTER — Encounter: Payer: Self-pay | Admitting: Internal Medicine

## 2012-07-26 ENCOUNTER — Other Ambulatory Visit: Payer: Self-pay

## 2012-08-14 ENCOUNTER — Ambulatory Visit (INDEPENDENT_AMBULATORY_CARE_PROVIDER_SITE_OTHER): Payer: No Typology Code available for payment source | Admitting: Internal Medicine

## 2012-08-14 ENCOUNTER — Encounter: Payer: Self-pay | Admitting: Internal Medicine

## 2012-08-14 VITALS — BP 127/76 | HR 69 | Temp 97.7°F | Ht 63.0 in | Wt 187.8 lb

## 2012-08-14 DIAGNOSIS — E119 Type 2 diabetes mellitus without complications: Secondary | ICD-10-CM

## 2012-08-14 DIAGNOSIS — E785 Hyperlipidemia, unspecified: Secondary | ICD-10-CM

## 2012-08-14 DIAGNOSIS — L282 Other prurigo: Secondary | ICD-10-CM

## 2012-08-14 DIAGNOSIS — Z299 Encounter for prophylactic measures, unspecified: Secondary | ICD-10-CM

## 2012-08-14 DIAGNOSIS — I10 Essential (primary) hypertension: Secondary | ICD-10-CM

## 2012-08-14 LAB — GLUCOSE, CAPILLARY: Glucose-Capillary: 108 mg/dL — ABNORMAL HIGH (ref 70–99)

## 2012-08-14 MED ORDER — AMLODIPINE BESYLATE 10 MG PO TABS: 10.0000 mg | ORAL_TABLET | Freq: Every day | ORAL | Status: DC

## 2012-08-14 MED ORDER — GLUCOSE BLOOD VI STRP: ORAL_STRIP | Status: DC

## 2012-08-14 MED ORDER — ONETOUCH LANCETS MISC: Status: DC

## 2012-08-14 MED ORDER — METFORMIN HCL 1000 MG PO TABS: ORAL_TABLET | ORAL | Status: DC

## 2012-08-14 MED ORDER — PRAVASTATIN SODIUM 40 MG PO TABS: 40.0000 mg | ORAL_TABLET | Freq: Every day | ORAL | Status: DC

## 2012-08-14 NOTE — Assessment & Plan Note (Signed)
Likely secondary to lisinopril.  Improved since discontinuation of lisinopril.  Did see dermatologist on last visit.  Still her some itching and hyperpigmented areas where rash used to be.  Does not wish to use benadryl anymore as it makes her very drowsy.  -will try calamine lotion for itch and soothing relief

## 2012-08-14 NOTE — Progress Notes (Signed)
Case discussed with Dr. Qureshi (at time of visit, soon after the resident saw the patient).  We reviewed the resident's history and exam and pertinent patient test results.  I agree with the assessment, diagnosis, and plan of care documented in the resident's note.  

## 2012-08-14 NOTE — Assessment & Plan Note (Signed)
Refilled pravastatin. 

## 2012-08-14 NOTE — Assessment & Plan Note (Signed)
BP Readings from Last 3 Encounters:  08/14/12 127/76  04/24/12 145/81  04/11/12 148/82   Lab Results  Component Value Date   NA 144 04/09/2012   K 4.1 04/09/2012   CREATININE 1.12* 04/09/2012   Assessment: Blood pressure control: controlled Progress toward BP goal:  at goal Comments:   Plan: Medications:  continue current medications norvasc 10mg 

## 2012-08-14 NOTE — Progress Notes (Signed)
Subjective:   Patient ID: Mandy Mullen female   DOB: [birth year] 62 y.o.   MRN: [id number]  HPI: [name] is a 62 y.o. African American female with uncontrolled DM2, HTN, and recent lisinopril induced diffuse body rash presenting to [facility] today for routine follow up.  Her A1C was checked today and has improved slightly, down to 10.6 today from 11.6.  She reports adherence to novolin 70/30 but has only been taking metfomin once a day. She has lost 8 pounds since her last visit in April 2014.    In regards to her blood pressure, she has discontinued lisinopril as it was determined to the cause of her body rash that has since then improved.  Her BP is well controlled with norvasc 10mg  daily.    She did see the dermatologist after my last visit with her.  The rash has since them improved but she still has some itching and hyper-pigmented areas on her skin where the rash areas existed.  She does not wish to take benadryl anymore for itching as it makes her very drowsy.  She is trying to keep the areas as moisturized as possible.  We will try calamine lotion to see if it helps at all.    Finally, she is asking to go back to the dentist since her top left tooth is loose and she knows it will need to be taken out again.  She has been to the dental clinic before and will call them for an appointment.  She denies any pain at this time or any inflammation.    She has seen [facility] [facility] in the past and will call and schedule a follow up appointment for diabetic eye exam.  She did not bring her meter again to clinic today and says in the past when she has taken metformin bid she felt like her sugar dropped at night but she could not recall when that was.  i have asked her to start checking her blood sugars three times a day and come to [facility] to meet with CDE in 1 month.  Likely her insulin regimen will need to be adjusted but it is difficult to do this without any measurements.    Past Medical History    Diagnosis Date  . Diabetes mellitus   . Hypertension   . MRSA (methicillin resistant Staphylococcus aureus)     Reccurent MRSA abscesses  . Hyperlipidemia   . History of herpes genitalis   . Conjunctivitis of left eye     h/o   Current Outpatient Prescriptions  Medication Sig Dispense Refill  . amLODipine (NORVASC) 10 MG tablet Take 1 tablet (10 mg total) by mouth daily.  30 tablet  11  . insulin NPH-insulin regular (NOVOLIN 70/30 RELION) (70-30) 100 UNIT/ML injection Take 30 units 30 minutes before breakfast. Take 30 units 30 minutes before dinner  30 mL  5  . metFORMIN (GLUCOPHAGE) 1000 MG tablet TAKE ONE TABLET BY MOUTH TWICE DAILY WITH  A  MEAL  60 tablet  11  . pravastatin (PRAVACHOL) 40 MG tablet Take 1 tablet (40 mg total) by mouth daily.  30 tablet  11   No current facility-administered medications for this visit.   Family History  Problem Relation Age of Onset  . Coronary artery disease Father   . Diabetes Sister   . Stroke Sister    History   Social History  . Marital Status: Single    Spouse Name: N/A  Number of Children: N/A  . Years of Education: N/A   Social History Main Topics  . Smoking status: Never Smoker   . Smokeless tobacco: None  . Alcohol Use: No  . Drug Use: No  . Sexually Active: None   Other Topics Concern  . None   Social History Narrative   Currently in school, studying nursing, will be done in 2 years   Single   Lives with son in [place]   Regular Exercise- yes- trying to go to [facility]                  Financial assistance application initiated.  Patient needs to submit further paperwork to complete per Rudell Cobb 07/29/09.   Financial assistance approved for 100% discount at [facility] and has [facility] card per [employer] 07/06/09.   Review of Systems:  Constitutional:  Denies fever, chills, diaphoresis, appetite change and fatigue.   HEENT:  Denies congestion, sore throat  Respiratory:  Denies SOB, DOE, cough, and wheezing.    Cardiovascular:  Denies chest pain, palpitations, and leg swelling.   Gastrointestinal:  Denies nausea, vomiting, abdominal pain, diarrhea   Genitourinary:  Denies dysuria, urgency, frequency  Musculoskeletal:  Denies myalgias, back pain, joint swelling, arthralgias and gait problem.   Skin:  Diffuse hyperpigmented lesions throughout body  Neurological:  Denies dizziness, seizures, syncope, weakness, light-headedness, numbness and headaches.    Objective:  Physical Exam: Filed Vitals:   08/14/12 1515  BP: 127/76  Pulse: 69  Temp: 97.7 F (36.5 C)  TempSrc: Oral  Height: 5\' 3"  (1.6 m)  Weight: 187 lb 12.8 oz (85.186 kg)  SpO2: 98%   Vitals reviewed. General: sitting in chair, NAD  HEENT: PERRL, EOMI, no scleral icterus Cardiac: RRR, no rubs, murmurs or gallops Pulm: clear to auscultation bilaterally, no wheezes, rales, or rhonchi Abd: soft, nontender, nondistended, BS present Ext: warm and well perfused, no pedal edema, +2DP B/L Skin: diffuse hyperpigmented areas on all extremities torso and back Neuro: alert and oriented X3, cranial nerves II-XII grossly intact, strength and sensation to light touch equal in bilateral upper and lower extremities  Assessment & Plan:  Discussed with Dr. Aundria Rud

## 2012-08-14 NOTE — Assessment & Plan Note (Signed)
Lab Results  Component Value Date   HGBA1C 10.6 08/14/2012   HGBA1C 11.6 04/09/2012   HGBA1C 10.9 09/27/2011    Assessment: Diabetes control: poor control (HgbA1C >9%) Progress toward A1C goal:  improved Comments: improved to 10.6 today, only taking metformin qd although supposed to be on bid.  Did not bring meter again.  Uses one touch meter will order supplies. Needs to see CDE 1 month from now and will HOPEFULLY bring meter to that visit.   Plan: Medications:  continue current medications novolin 70/30 and metformin BID Home glucose monitoring: Frequency: 3 times a day Timing: before meals Instruction/counseling given: reminded to get eye exam, reminded to bring blood glucose meter & log to each visit, reminded to bring medications to each visit, discussed the need for weight loss and discussed diet Educational resources provided:   Self management tools provided:   Other plans: seen at [facility], needs to schedule diabetic eye exam

## 2012-08-14 NOTE — Assessment & Plan Note (Addendum)
She will call to make eye appointment a1c checked today Cannot afford zostavax

## 2012-08-14 NOTE — Patient Instructions (Addendum)
General Instructions:  Congratulations on your weight loss and I am glad the rash is improving.  Try calamine lotion to see if that may help soothe your skin more as well.    Please let me know if you are unable to pick up your prescriptions from the health department due to cost and we will change to [facility]  PLEASE BRING YOUR METER TO YOUR VISIT NEXT MONTH WITH DONNA AND CHECK YOUR SUGARS THREE TIMES A DAY WITH MEALS. Continue exercise and portion control.  Call [facility] [facility] to schedule your next eye exam and also call dental clinic  Treatment Goals:  Goals (1 Years of Data) as of 08/14/12         As of Today 04/24/12 04/11/12 04/09/12 11/15/11     Blood Pressure    . Blood Pressure < 140/90  127/76 145/81 148/82 142/79 137/80     Result Component    . HEMOGLOBIN A1C < 7.0  10.6   11.6     . LDL CALC < 100            Progress Toward Treatment Goals:  Treatment Goal 08/14/2012  Hemoglobin A1C improved  Blood pressure at goal    Self Care Goals & Plans:  Self Care Goal 08/14/2012  Manage my medications take my medicines as prescribed  Monitor my health bring my glucose meter and log to each visit; keep track of my blood glucose; keep track of my weight  Eat healthy foods -  Be physically active -    Home Blood Glucose Monitoring 08/14/2012  Check my blood sugar 3 times a day  When to check my blood sugar before meals     Care Management & Community Referrals:  Referral 04/24/2012  Referrals made for care management support none needed     Itching Itching is a symptom that can be caused by many things. These include skin problems (including infections) as well as some internal diseases.  If the itching is affecting just one area of the body, it is most likely due to a common skin problem, such as:  Poison oak and poison ivy.  Contact dermatitis (skin irritation from a plant, chemicals, fiberglass, detergents, new cosmetic, new jewelry, or other substance).  Fungus  (such as athlete's foot, jock itch, or ringworm).  Head lice  Dandruff  Insect bite  Infection (such as Shingles or other virus infections). If the itching is all over (widespread), the possible causes are many. These include:   Dry skin or eczema  Heat rash  Hives  Liver disorders  Kidney disorders TREATMENT  Localized itching   Lubrication of the skin. Use an ointment or cream or other unperfumed moisturizers if the skin is dry. Apply frequently, especially after bathing.  Anti-itch medicines. These medications may help control the urge to scratch. Scratching always makes itching worse and increases the chance of getting an infection.  Cortisone creams and ointments. These help reduce the inflammation.  Antibiotics. Skin infections can cause itching. Topical or oral antibiotics may be needed for 10 to 20 days to get rid of an infection. If you can identify what caused the itching, avoid this substance in the future.  Widespread itching  The following measures may help to relieve itching regardless of the cause:   Wash the skin once with soap to remove irritants.  Bathe in tepid water with baking soda, cornstarch, or oatmeal.  Use calamine lotion (nonprescription) or a baking soda solution (1 teaspoon in 4  ounces of water on the skin).  Apply 1% hydrocortisone cream (no prescription needed). Do not use this if there might be a skin infection.  Avoid scratching.  Avoid itchy or tight-fitting clothes.  Avoid excessive heat, sweating, scented soaps, and swimming pools.  The lubricants, anti-itch medicines, etc. noted above may be helpful for controlling symptoms. SEEK MEDICAL CARE IF:   The itching becomes severe.  Your itch is not better after 1 week of treatment. Contact your caregiver to schedule further evaluation. Document Released: 01/03/2005 Document Revised: 03/28/2011 Document Reviewed: 06/23/2006 [facility] Patient Information 2014 [place], [place].

## 2012-09-10 ENCOUNTER — Ambulatory Visit (INDEPENDENT_AMBULATORY_CARE_PROVIDER_SITE_OTHER): Payer: No Typology Code available for payment source | Admitting: Dietician

## 2012-09-10 DIAGNOSIS — E119 Type 2 diabetes mellitus without complications: Secondary | ICD-10-CM

## 2012-09-10 LAB — HM DIABETES EYE EXAM

## 2012-09-10 MED ORDER — BLOOD GLUCOSE MONITORING SUPPL DEVI: 1.0000 | Freq: Four times a day (QID) | Status: AC

## 2012-09-10 NOTE — Progress Notes (Signed)
Medical Nutrition Therapy:  Appt start time: 1000 end time:  1100.  CBG today in office was 150 after breakfast and insulin.  Assessment:  Primary concerns today: Blood sugar control.  Wants A1C to be a "7". "Feels like she has to starve to death to get it there. " patient describes healthy choices most of the time.  Usual eating pattern includes 2 meals and 2 snacks per day. Has been having problems with low blood sugars before dinner since she stopped eating lunch to try to cut back on food to control her weight and blood sugars. Has had low blood sugars nocturnally before. Did not bring meter today. Reports blood sugar 150 right after eating and taking insulin in ma, 70-100 before dinner ( 2- 5 PM) and 200-300 before bed.  Usual physical activity includes house and yard works, looking for employment. Patient declined weigh today Frequent foods include sugar free cranberry juice, water.  Avoided foods include lactose- milk   Current TDD- 60 units, Estimated TDD 43-103 units/day  24-hr recall: (Up at 7-8 AM) B (10 AM)-   Cheerios & Milk, hotdog on 1 sl bread or oatmeal  Snk ( PM)- fruit, sometimes peanut butter crackers D ( PM)- baked meat, vegetable and starch.  Snk ( PM)- sugar free Juice, grahams and peanutbutter  Progress Towards Goal(s):  In progress.   Nutritional Diagnosis:  NB-1.1 Food and nutrition-related knowledge deficit As related to lack of prior education about insulin action and importance of timing and mathingit to food intake.  As evidenced by her report and taking insulin after she eats.    Intervention:  Nutrition education about insulin action, self monitoring and carb consistency. Self monitoring- explained importance of self monitoring when insulin timing or amount changed Called meter in to [facility] and explained costs to patient.  Monitoring/Evaluation:  Dietary intake, exercise, blood sugars, and body weight in 3 week(s).

## 2012-09-10 NOTE — Patient Instructions (Addendum)
Great Job Eating Healthy!!!  Happy Birthday!! Have fun!!!  Take insulin 30 minutes before meals and to know how this affects blood sugars (works for you)  Please check blood sugar at least 4 times  In the next 2-3 days before meals and bedtime.  I will call a meter in to the health depart ment for you today.  I agree with try ing to be sure to eat more for lunch/ midday- probably at last 2 servings of carbohydrate midday and if dinner is late- you may want 1 serving later in the afternoon.  Please make a follow up appointment for 2-3 weeks

## 2012-09-14 NOTE — Addendum Note (Signed)
Addended by: Bufford Spikes on: 09/14/2012 10:32 AM   Modules accepted: Orders

## 2012-09-14 NOTE — Addendum Note (Signed)
Addended by: Bufford Spikes on: 09/14/2012 10:33 AM   Modules accepted: Orders

## 2012-10-29 NOTE — Addendum Note (Signed)
Addended by: Bufford Spikes on: 10/29/2012 12:14 PM   Modules accepted: Orders

## 2012-12-11 ENCOUNTER — Other Ambulatory Visit: Payer: Self-pay | Admitting: *Deleted

## 2012-12-11 DIAGNOSIS — E119 Type 2 diabetes mellitus without complications: Secondary | ICD-10-CM

## 2012-12-11 MED ORDER — INSULIN NPH ISOPHANE & REGULAR (70-30) 100 UNIT/ML ~~LOC~~ SUSP: SUBCUTANEOUS | Status: DC

## 2012-12-12 ENCOUNTER — Telehealth: Payer: Self-pay | Admitting: Dietician

## 2012-12-12 NOTE — Telephone Encounter (Signed)
Called patient to find out if she has been taking insulin: she says she takes 30 units twice a day and has been consistent with that. She explained the reason  Her last refill was 1 year ago was that she had the orange card and was using the [facility], then stopped and was using [facility] insulin, then was back at [facility]. However, she says her orange card  expired recently.  She has a new job that she intends to have insurance coverage Dec 1st. She will call us in December after she is sure she is covered to schedule and appointment

## 2013-02-06 ENCOUNTER — Encounter: Payer: Self-pay | Admitting: *Deleted

## 2013-02-06 NOTE — Progress Notes (Unsigned)
Patient ID: Mandy Mullen, female   DOB: [birth year], 63 y.o.   MRN: [id number] Tried to call pt, no answer, left message. Called [facility] and pt's orange card expired in December. She will need to tell triage what pharmacy she wants to use

## 2013-05-14 ENCOUNTER — Other Ambulatory Visit: Payer: Self-pay | Admitting: Internal Medicine

## 2013-06-25 ENCOUNTER — Ambulatory Visit (INDEPENDENT_AMBULATORY_CARE_PROVIDER_SITE_OTHER): Payer: BC Managed Care – PPO | Admitting: Internal Medicine

## 2013-06-25 ENCOUNTER — Encounter: Payer: Self-pay | Admitting: Internal Medicine

## 2013-06-25 VITALS — BP 129/78 | HR 79 | Temp 98.1°F | Resp 20 | Ht 63.0 in | Wt 187.7 lb

## 2013-06-25 DIAGNOSIS — Z1239 Encounter for other screening for malignant neoplasm of breast: Secondary | ICD-10-CM

## 2013-06-25 DIAGNOSIS — I10 Essential (primary) hypertension: Secondary | ICD-10-CM

## 2013-06-25 DIAGNOSIS — Z00129 Encounter for routine child health examination without abnormal findings: Secondary | ICD-10-CM

## 2013-06-25 DIAGNOSIS — E785 Hyperlipidemia, unspecified: Secondary | ICD-10-CM

## 2013-06-25 DIAGNOSIS — Z23 Encounter for immunization: Secondary | ICD-10-CM

## 2013-06-25 DIAGNOSIS — E119 Type 2 diabetes mellitus without complications: Secondary | ICD-10-CM

## 2013-06-25 DIAGNOSIS — Z0000 Encounter for general adult medical examination without abnormal findings: Secondary | ICD-10-CM

## 2013-06-25 LAB — LIPID PANEL
CHOL/HDL RATIO: 3.4 ratio
CHOLESTEROL: 188 mg/dL (ref 0–200)
HDL: 55 mg/dL (ref >39)
LDL Cholesterol: 107 mg/dL — ABNORMAL HIGH (ref 0–99)
Triglycerides: 130 mg/dL (ref <150)
VLDL: 26 mg/dL (ref 0–40)

## 2013-06-25 LAB — GLUCOSE, CAPILLARY: Glucose-Capillary: 229 mg/dL — ABNORMAL HIGH (ref 70–99)

## 2013-06-25 LAB — POCT GLYCOSYLATED HEMOGLOBIN (HGB A1C): HEMOGLOBIN A1C: 10.1

## 2013-06-25 MED ORDER — INSULIN NPH ISOPHANE & REGULAR (70-30) 100 UNIT/ML ~~LOC~~ SUSP: SUBCUTANEOUS | Status: DC

## 2013-06-25 MED ORDER — AMLODIPINE BESYLATE 10 MG PO TABS: 10.0000 mg | ORAL_TABLET | Freq: Every day | ORAL | Status: DC

## 2013-06-25 MED ORDER — PRAVASTATIN SODIUM 40 MG PO TABS: 40.0000 mg | ORAL_TABLET | Freq: Every day | ORAL | Status: DC

## 2013-06-25 NOTE — Assessment & Plan Note (Addendum)
DM2: a1c, foot exam done today. Re-ordered supplies, she has been out of her strips and needs either new meter ([employer]) or strips for old meter called in if her insurance will cover it.  HL: check lipid panel today  Colon cancer screening: colonoscopy due 01/2015  Breast cancer screening: ordered mammogram  HTN: well controlled. Check urine microalbumin today  Vaccines due: Tdap done today--we were initially awaiting call from pharmacy to review if egg components, however she wishes to proceed with tdap at this time, understands risks, has reviewed the vaccine handout, and wishes to proceed with tdap at this time.  We have advised her to call right away if she notices any reaction and go to ER if severe. Pneumococcal vaccine given in 2010, wont be due for repeat until she is 62.  Zostavax vaccine prescription given incase it is covered by her insurance and if no egg components. If given zostavax, asked to let us know to update our records.

## 2013-06-25 NOTE — Assessment & Plan Note (Signed)
Lab Results  Component Value Date   HGBA1C 10.1 06/25/2013   HGBA1C 10.6 08/14/2012   HGBA1C 11.6 04/09/2012    Assessment: Diabetes control: poor control (HgbA1C >9%) Progress toward A1C goal:  improved Comments: has been out of strips, now cbg checks, reports lows to 40s occasionally a few months ago but no more for the past 1.5 months  Plan: Medications:  continue current medications continue NPH 70/30 30 units AM and PM  Home glucose monitoring: Frequency: 3 times a day Timing: before meals Instruction/counseling given: reminded to bring blood glucose meter & log to each visit, reminded to bring medications to each visit, discussed foot care, discussed the need for weight loss and discussed diet Educational resources provided: brochure Self management tools provided: home glucose logbook Other plans: reviewed hypoglycemia symptoms and how to correct. She will call with what meter and strips she has at home and we will order if covered by insurance or change to reli-on. Will ask CDE to follow up in 2 weeks after she starts checking her cbg's to adjust insulin if needed.  RTC 3 months

## 2013-06-25 NOTE — Progress Notes (Signed)
Subjective:   Patient ID: Mandy Mullen female   DOB: 1950/12/21 63 y.o.   MRN: [id number]  HPI: [name] is a 63 y.o. African American female with DM2, HTN, and HL presenting to [facility] today for preventative visit.   She is not due for colon cancer screening, her last colonoscopy was in 12/2009 and is due for repeat 01/2015. Colonoscopy report at that time showed 2 diminutive <26mm polyps that were removed shown to be tubular adenoma, hyperplastic polyp with no high grade dysplasia on pathology.   She is s/p hysterectomy in 1999 but still has R ovary. She believes she has had her cervix removed and does not require papsmears anymore. We will try to get old records, she was followed by Dr. Lorriane Shire.    She is due for breast cancer screening, her last mammogram was in 2011. We have ordered that today.   In regards to her diabetes, remains uncontrolled, last A1C 10.6, today slightly improved to 10.1. We will recheck A1C today, do foot exam. Eye exam is not due until 08/2013. No evidence of diabetic retinopathy on eye exam last year. She did not bring her meter today and has not been using it as she is out of strips and says they are expensive but has not checked the price with her insurance. We will try to re-order supplies today that will be covered by insurance, if not we will change to reli-on and follow up on values. She does report low cbg's down to 40s occasionally a few months ago, but ever since she has changed her diet she says her blood sugars are much better and no more feeling low for the past 1.5 months.  We reviewed symptoms of hypoglycemia again today and how to correct. We will not change her insulin at this time but once we can see her cbg trend better that can be adjusted.   Weight was 187.8 lb's on last visit in July 2014, today stable at 187lbs. Her last lipid panel in 2013 showed improvement, with LDL down to 89. She is on a statin (pravastatin). We will recheck  Lipid panel today. We discussed increasing exercise and cutting back on starches.   Vaccines due today: TDAP today. Pneumococcal done in 2010, repeat when she is 52. She does not think zostavax is covered by her insurance but I have written her a prescription for it just in case she can afford it. Of note, she is allergic to egg derived products based on flu vaccine, we tried to verify components with pharmacy but she would like to proceed with tdap today and has reviewed the literature in detail. Advised to call us right away if any signs of reaction or go to emergency room if severe or anaphylaxis.   HTN: BP well controlled 129/78 today. On norvasc 10mg . We will check urine microalbumin today if she can give urine sample  In the past 4 weeks she has only slightly been bothered by emotional problems and has had very mild bodily pain.  She is fairly active, can do very heavy physical activity, can get to places without assistance, prepares her own meals, has no difficulty driving her car, wears a seatbelt, and does her own housework. She rates her health as very good. She exercises for about 20 minutes usually 3+ days of the week.  No trouble with taking her medications.    She does report having teeth issues in that she wishes to get her front  two teeth pulled but has not been able to do so thus far due to lack of insurance. She now has dental coverage and will contact a dentist soon. She has not fallen recently and is not a smoker and does not drink alcohol.    Past Medical History  Diagnosis Date  . Diabetes mellitus   . Hypertension   . MRSA (methicillin resistant Staphylococcus aureus)     Reccurent MRSA abscesses  . Hyperlipidemia   . History of herpes genitalis   . Conjunctivitis of left eye     h/o   Current Outpatient Prescriptions  Medication Sig Dispense Refill  . amLODipine (NORVASC) 10 MG tablet Take 1 tablet (10 mg total) by mouth daily.  30 tablet  11  . glucose blood test  strip Use as instructed  100 each  12  . insulin NPH-regular (NOVOLIN 70/30 RELION) (70-30) 100 UNIT/ML injection Take 30 units 30 minutes before breakfast. Take 30 units 30 minutes before dinner  30 mL  5  . metFORMIN (GLUCOPHAGE) 1000 MG tablet TAKE ONE TABLET BY MOUTH TWICE DAILY WITH  A  MEAL  60 tablet  11  . metFORMIN (GLUCOPHAGE) 1000 MG tablet TAKE ONE TABLET BY MOUTH TWICE DAILY WITH A MEAL  60 tablet  11  . ONE TOUCH LANCETS MISC Use to check blood sugars  200 each  5  . pravastatin (PRAVACHOL) 40 MG tablet Take 1 tablet (40 mg total) by mouth daily.  30 tablet  11   No current facility-administered medications for this visit.   Family History  Problem Relation Age of Onset  . Coronary artery disease Father   . Diabetes Sister   . Stroke Sister    History   Social History  . Marital Status: Single    Spouse Name: N/A    Number of Children: N/A  . Years of Education: N/A   Social History Main Topics  . Smoking status: Never Smoker   . Smokeless tobacco: None  . Alcohol Use: No  . Drug Use: No  . Sexual Activity: None   Other Topics Concern  . None   Social History Narrative   Currently in school, studying nursing, will be done in 2 years   Single   Lives with son in [place]   Regular Exercise- yes- trying to go to [place] assistance application initiated.  Patient needs to submit further paperwork to complete per Bonna Gains 07/29/09.   Financial assistance approved for 100% discount at [facility] and has [facility] card per [employer] 07/06/09.   Review of Systems:  Constitutional:  Denies fever, chills  HEENT:  Denies congestion  Respiratory:  Denies SOB  Cardiovascular:  Denies chest pain and leg swelling.   Gastrointestinal:  Denies nausea, vomiting, abdominal pain   Genitourinary:  Denies dysuria  Musculoskeletal:  Denies gait problem  Skin:  Darkened skin discoloration patches  Neurological:  Denies headaches.    Objective:    Physical Exam: Filed Vitals:   06/25/13 1537  BP: 129/78  Pulse: 79  Temp: 98.1 F (36.7 C)  TempSrc: Oral  Resp: 20  Height: 5\' 3"  (1.6 m)  Weight: 187 lb 11.2 oz (85.14 kg)  SpO2: 98%   Vitals reviewed. General: sitting in chair, NAD HEENT: EOMI Cardiac: RRR Pulm: clear to auscultation bilaterally, no wheezes, rales, or rhonchi Abd: soft, nondistended, BS present Ext: warm and well  perfused, no pedal edema, +2DP B/L, -tenderness to palpation Neuro: alert and oriented X3, strength and sensation to light touch equal in bilateral upper and lower extremities Skin: Patches of darkened skin discoloration from healing drug rash/reaction on extremities, neck  Assessment & Plan:  Discussed with Dr. Ellwood Dense

## 2013-06-25 NOTE — Assessment & Plan Note (Signed)
BP Readings from Last 3 Encounters:  06/25/13 129/78  08/14/12 127/76  04/24/12 145/81   Lab Results  Component Value Date   NA 144 04/09/2012   K 4.1 04/09/2012   CREATININE 1.12* 04/09/2012   Assessment: Blood pressure control: controlled Progress toward BP goal:  at goal Comments:   Plan: Medications:  continue current medications norvasc 10mg  qd

## 2013-06-25 NOTE — Patient Instructions (Addendum)
General Instructions:  Dear Ms. Steinkamp,  Thank you so much for coming in for your visit today.   We need to keep working on your diabetes. Your A1C was slightly better, but we can definitely do better! So lets get your meter, please call me right away with the company of your meter and strips and check with your insurance what we can cover and I will place the order, otherwise lets switch to reli-on. Once you get the meter let us know, check your blood sugar three times a day before meals and record your values and we will call you in two weeks to see how things are going and adjust insulin as needed.  If you notice any more low blood sugars, hold your insulin dose, correct the blood sugar with glucose tablet or juice or candy like you have been doing, and call us [phone]  Please go for your mammogram 07/08/13  We will give your vaccines today if you are not allergic to any of the components.   Your colonoscopy is due 01/2015 Your eye exam is due 08/2013  We checked your cholesterol today, keep taking your pravstatin  Your blood pressure is great! Lets work on increasing exercise a little more and cutting back on starches  Please try to get your shingles vaccine if you can afford it  Please bring your medicines with you each time you come to clinic.  Medicines may include prescription medications, over-the-counter medications, herbal remedies, eye drops, vitamins, or other pills.   Progress Toward Treatment Goals:  Treatment Goal 06/25/2013  Hemoglobin A1C improved  Blood pressure at goal    Self Care Goals & Plans:  Self Care Goal 06/25/2013  Manage my medications take my medicines as prescribed; refill my medications on time; bring my medications to every visit  Monitor my health keep track of my blood glucose  Eat healthy foods eat fruit for snacks and desserts; drink diet soda or water instead of juice or soda  Be physically active take a walk every day    Home Blood Glucose  Monitoring 06/25/2013  Check my blood sugar 3 times a day  When to check my blood sugar before meals     Care Management & Community Referrals:  Referral 04/24/2012  Referrals made for care management support none needed     Goals (1 Years of Data) as of 06/25/13         As of Today 08/14/12 04/24/12 04/09/12 09/27/11     Blood Pressure    . Blood Pressure < 140/90  129/78 127/76 145/81       Result Component    . HEMOGLOBIN A1C < 7.0  10.1 10.6  11.6     . LDL CALC < 100      89

## 2013-06-25 NOTE — Assessment & Plan Note (Signed)
Lipid panel today  On statin

## 2013-06-26 LAB — MICROALBUMIN / CREATININE URINE RATIO
CREATININE, URINE: 192.3 mg/dL
MICROALB UR: 0.61 mg/dL (ref 0.00–1.89)
MICROALB/CREAT RATIO: 3.2 mg/g (ref 0.0–30.0)

## 2013-07-01 NOTE — Progress Notes (Signed)
Case discussed with Dr. Qureshi at the time of the visit.  We reviewed the resident's history and exam and pertinent patient test results.  I agree with the assessment, diagnosis, and plan of care documented in the resident's note. 

## 2013-07-03 ENCOUNTER — Ambulatory Visit (INDEPENDENT_AMBULATORY_CARE_PROVIDER_SITE_OTHER): Payer: BC Managed Care – PPO | Admitting: Internal Medicine

## 2013-07-03 ENCOUNTER — Other Ambulatory Visit: Payer: Self-pay | Admitting: Dermatology

## 2013-07-03 ENCOUNTER — Encounter: Payer: Self-pay | Admitting: Internal Medicine

## 2013-07-03 VITALS — BP 139/83 | HR 84 | Temp 98.8°F | Ht 63.0 in | Wt 186.0 lb

## 2013-07-03 DIAGNOSIS — I10 Essential (primary) hypertension: Secondary | ICD-10-CM

## 2013-07-03 DIAGNOSIS — R21 Rash and other nonspecific skin eruption: Secondary | ICD-10-CM

## 2013-07-03 DIAGNOSIS — E119 Type 2 diabetes mellitus without complications: Secondary | ICD-10-CM

## 2013-07-03 MED ORDER — LORATADINE 10 MG PO TABS: 10.0000 mg | ORAL_TABLET | Freq: Every day | ORAL | Status: DC

## 2013-07-03 MED ORDER — DIPHENHYDRAMINE HCL 25 MG PO TABS: 25.0000 mg | ORAL_TABLET | Freq: Three times a day (TID) | ORAL | Status: DC | PRN

## 2013-07-03 NOTE — Assessment & Plan Note (Signed)
Lab Results  Component Value Date   HGBA1C 10.1 06/25/2013   HGBA1C 10.6 08/14/2012   HGBA1C 11.6 04/09/2012    Assessment: Diabetes control: poor control (HgbA1C >9%) Progress toward A1C goal:  improved  Plan: Medications:  Continue NPH and metformin

## 2013-07-03 NOTE — Patient Instructions (Addendum)
General Instructions: Dear Ms. Kramme,  Please go to the dermatologist this afternoon @3pm --dermatologist specialists: Dr. Tonia Brooms [address], [place], [place] [address] [phone]  You can try non-drowsy antihistamine (claritin) when you have to go work and benadryl when you are at home to sleep  If it gets worse, or you have fever, chills, go directly to the ER and call dermatology office and pcp office  Please bring your medicines with you each time you come to clinic.  Medicines may include prescription medications, over-the-counter medications, herbal remedies, eye drops, vitamins, or other pills.   Progress Toward Treatment Goals:  Treatment Goal 07/03/2013  Hemoglobin A1C improved  Blood pressure at goal    Self Care Goals & Plans:  Self Care Goal 07/03/2013  Manage my medications take my medicines as prescribed; bring my medications to every visit; refill my medications on time  Monitor my health keep track of my blood glucose; bring my glucose meter and log to each visit; check my feet daily  Eat healthy foods eat more vegetables; eat foods that are low in salt; eat baked foods instead of fried foods  Be physically active take a walk every day    Home Blood Glucose Monitoring 07/03/2013  Check my blood sugar -  When to check my blood sugar before meals     Care Management & Community Referrals:  Referral 04/24/2012  Referrals made for care management support none needed

## 2013-07-03 NOTE — Progress Notes (Signed)
Subjective:   Patient ID: Mandy Mullen female   DOB: Nov 17, 1950 63 y.o.   MRN: [id number]  HPI: [name] is a 63 y.o. African American obese female with DM2 and HTN presenting to [facility] today for acute visit with recurrent diffuse painful and pruritic body rash.  She was noted to have similar outbreak last year with unknown trigger thought possibly secondary to lisinopril.  However, this time she claims it started with sharp pain sensation throughout her body on Sunday evening and then has gotten worse in regards to the rash.  She denies taking any new medications or trying any new food. She did get tdap vaccination last week but was fine after vaccination.  Initially when she noticed the rash, she reports very stiff joints but now can move all her extremities and walk but still sore.   Past Medical History  Diagnosis Date  . Diabetes mellitus   . Hypertension   . MRSA (methicillin resistant Staphylococcus aureus)     Reccurent MRSA abscesses  . Hyperlipidemia   . History of herpes genitalis   . Conjunctivitis of left eye     h/o   Current Outpatient Prescriptions  Medication Sig Dispense Refill  . amLODipine (NORVASC) 10 MG tablet Take 1 tablet (10 mg total) by mouth daily.  30 tablet  11  . glucose blood test strip Use as instructed  100 each  12  . insulin NPH-regular Human (NOVOLIN 70/30 RELION) (70-30) 100 UNIT/ML injection Take 30 units 30 minutes before breakfast. Take 30 units 30 minutes before dinner  30 mL  5  . metFORMIN (GLUCOPHAGE) 1000 MG tablet TAKE ONE TABLET BY MOUTH TWICE DAILY WITH A MEAL  60 tablet  11  . Multiple Vitamin (MULTIVITAMIN) tablet Take 1 tablet by mouth daily.      . ONE TOUCH LANCETS MISC Use to check blood sugars  200 each  5  . pravastatin (PRAVACHOL) 40 MG tablet Take 1 tablet (40 mg total) by mouth daily.  30 tablet  11   No current facility-administered medications for this visit.   Family History  Problem Relation Age of Onset  .  Coronary artery disease Father   . Diabetes Sister   . Stroke Sister    History   Social History  . Marital Status: Single    Spouse Name: N/A    Number of Children: N/A  . Years of Education: N/A   Social History Main Topics  . Smoking status: Never Smoker   . Smokeless tobacco: None  . Alcohol Use: No  . Drug Use: No  . Sexual Activity: None   Other Topics Concern  . None   Social History Narrative   Currently in school, studying nursing, will be done in 2 years   Single   Lives with son in [place]   Regular Exercise- yes- trying to go to [place] assistance application initiated.  Patient needs to submit further paperwork to complete per Bonna Gains 07/29/09.   Financial assistance approved for 100% discount at [facility] and has [facility] card per [employer] 07/06/09.   Review of Systems:  Constitutional:  Denies fever, chills  HEENT:  Denies congestion  Respiratory:  Denies SOB  Cardiovascular:  Denies chest pain  Gastrointestinal:  Denies nausea, vomiting, abdominal pain   Genitourinary:  Denies dysuria  Musculoskeletal:  B/l joints pain when rash first erupted  Skin:  Diffuse painful rash  Neurological:  Denies syncope    Objective:  Physical Exam: Filed Vitals:   07/03/13 1017  BP: 139/83  Pulse: 84  Temp: 98.8 F (37.1 C)  TempSrc: Oral  Height: 5\' 3"  (1.6 m)  Weight: 186 lb (84.369 kg)  SpO2: 98%   Vitals reviewed. General: sitting in chair, acute distress due to rash and pain HEENT: EOMI, no obvious ulcers on mouth exam Cardiac: RRR Pulm: clear to auscultation bilaterally, no wheezes, rales, or rhonchi Abd: soft, nondistended, BS present Ext: warm and well perfused, no pedal edema, +2DP B/L, tenderness to palpation of b/l extermities Neuro: alert and oriented X3 Skin: diffuse darkened and tender patches on skin from head to toe relatively sparing face but lip skin peeling             Assessment & Plan:    Discussed with Dr. Dareen Piano Recurrent diffuse body rash--urgent return to dermatology at 3pm today -benadryl and claritin prn

## 2013-07-03 NOTE — Assessment & Plan Note (Signed)
Recurrent, unknown etiology. Painful, pruritic, darkened patches on skin from head to toe relatively sparing face but lips peeling. No obvious trigger, but does not appear medication induced as she has been on these medications for quite some time. Appears allergic in origin but trigger remains unclear. Possible autoimmune or rheumatologic etiology also possible (stiff joints with start of rash). Noted to have elevated sed rate and complement last year.   -refer back urgently to dermatology where seen last time, schedule appt for 3pm today -continue benadryl -may try claritin if working as non-drowsy option -if worsening, advised to go to ER if fever and chills and to call opc as well

## 2013-07-03 NOTE — Assessment & Plan Note (Signed)
BP Readings from Last 3 Encounters:  07/03/13 139/83  06/25/13 129/78  08/14/12 127/76   Lab Results  Component Value Date   NA 144 04/09/2012   K 4.1 04/09/2012   CREATININE 1.12* 04/09/2012   Assessment: Blood pressure control: controlled Progress toward BP goal:  at goal  Plan: Medications: continue norvasc

## 2013-07-04 NOTE — Progress Notes (Signed)
INTERNAL MEDICINE TEACHING ATTENDING ADDENDUM - Aldine Contes, MD: I personally saw and evaluated Mandy Mullen in this clinic visit in conjunction with the resident, Dr. Eula Fried. I have discussed patient's plan of care with medical resident during this visit. I have confirmed the physical exam findings and have read and agree with the clinic note including the plan with the following addition: - patient with diffuse pruritic rash likely allergic in nature, Uncertain etiology - Follow up with derm - c/w  Benadryl/claritin

## 2013-07-05 ENCOUNTER — Ambulatory Visit (HOSPITAL_COMMUNITY)
Admission: RE | Admit: 2013-07-05 | Discharge: 2013-07-05 | Disposition: A | Discharge status: Home / Self Care | Payer: BC Managed Care – PPO | Source: Ambulatory Visit | Attending: Dermatology | Admitting: Dermatology

## 2013-07-05 ENCOUNTER — Other Ambulatory Visit (HOSPITAL_COMMUNITY): Payer: Self-pay | Admitting: Dermatology

## 2013-07-05 DIAGNOSIS — J029 Acute pharyngitis, unspecified: Secondary | ICD-10-CM | POA: Insufficient documentation

## 2013-07-05 DIAGNOSIS — L259 Unspecified contact dermatitis, unspecified cause: Secondary | ICD-10-CM

## 2013-07-05 DIAGNOSIS — E119 Type 2 diabetes mellitus without complications: Secondary | ICD-10-CM | POA: Insufficient documentation

## 2013-07-08 ENCOUNTER — Ambulatory Visit (HOSPITAL_COMMUNITY): Admission: RE | Admit: 2013-07-08 | Payer: BC Managed Care – PPO | Source: Ambulatory Visit

## 2013-07-09 ENCOUNTER — Telehealth: Payer: Self-pay | Admitting: Dietician

## 2013-07-11 NOTE — Telephone Encounter (Signed)
Called patient to obtain blood sugar readings per Dr. Demetrio Lapping request: Mandy Mullen says they are a bit higher than usual because of her rash which is getting better but is still there.  She did not give specific numbers but said they are in the 150s fasting,  150-180s in the afternoon and 120-140 in the evening when she drinks more than eats. She has no questions or concerns at this time.

## 2013-10-25 ENCOUNTER — Encounter: Payer: Self-pay | Admitting: *Deleted

## 2013-11-26 ENCOUNTER — Encounter: Payer: BC Managed Care – PPO | Admitting: Internal Medicine

## 2013-12-03 ENCOUNTER — Encounter: Payer: BC Managed Care – PPO | Admitting: Internal Medicine

## 2014-02-25 ENCOUNTER — Telehealth: Payer: Self-pay | Admitting: Internal Medicine

## 2014-02-25 NOTE — Telephone Encounter (Signed)
Call to patient to confirm appointment for 2/10 at 9:15 lmtcb

## 2014-02-26 ENCOUNTER — Encounter: Payer: Self-pay | Admitting: Internal Medicine

## 2014-02-26 ENCOUNTER — Ambulatory Visit (INDEPENDENT_AMBULATORY_CARE_PROVIDER_SITE_OTHER): Payer: Self-pay | Admitting: Internal Medicine

## 2014-02-26 VITALS — BP 130/65 | HR 76 | Temp 98.0°F | Ht 63.0 in | Wt 187.6 lb

## 2014-02-26 DIAGNOSIS — Z0000 Encounter for general adult medical examination without abnormal findings: Secondary | ICD-10-CM

## 2014-02-26 DIAGNOSIS — Z794 Long term (current) use of insulin: Secondary | ICD-10-CM

## 2014-02-26 DIAGNOSIS — I10 Essential (primary) hypertension: Secondary | ICD-10-CM

## 2014-02-26 DIAGNOSIS — IMO0002 Reserved for concepts with insufficient information to code with codable children: Secondary | ICD-10-CM

## 2014-02-26 DIAGNOSIS — E1165 Type 2 diabetes mellitus with hyperglycemia: Secondary | ICD-10-CM

## 2014-02-26 LAB — HM DIABETES EYE EXAM

## 2014-02-26 LAB — POCT GLYCOSYLATED HEMOGLOBIN (HGB A1C): Hemoglobin A1C: 10.4

## 2014-02-26 LAB — GLUCOSE, CAPILLARY: Glucose-Capillary: 173 mg/dL — ABNORMAL HIGH (ref 70–99)

## 2014-02-26 MED ORDER — INSULIN NPH (HUMAN) (ISOPHANE) 100 UNIT/ML ~~LOC~~ SUSP: SUBCUTANEOUS | Status: DC

## 2014-02-26 NOTE — Progress Notes (Signed)
Case discussed with Dr. Qureshi soon after the resident saw the patient.  We reviewed the resident's history and exam and pertinent patient test results.  I agree with the assessment, diagnosis, and plan of care documented in the resident's note. 

## 2014-02-26 NOTE — Patient Instructions (Addendum)
General Instructions:  Please bring your medicines with you each time you come to clinic.  Medicines may include prescription medications, over-the-counter medications, herbal remedies, eye drops, vitamins, or other pills.  Dear Mandy Mullen,  Lets get back on control with your diabetes!   Please check your blood sugars three times a day and record your values. Bring your meter to clinic in 2 weeks for follow up and you will also need to meet with Butch Penny plyler.   Your insulin has been changed to NPH -N 20 units 30 minutes before breakfast and 30 units 30 minutes before dinner. If you are still having low blood sugars, notify me immediately [phone]. Correctly your blood sugar as you have been doing with juice or candy. DO NOT TAKE INSULIN IF YOU ARE NOT EATING. Please try to eat regular well balanced meals at least 3 times a day.   I am so sorry to hear about your son and hope things get better for you  Please get your mammogram done  Progress Toward Treatment Goals:  Treatment Goal 02/26/2014  Hemoglobin A1C deteriorated  Blood pressure at goal    Self Care Goals & Plans:  Self Care Goal 02/26/2014  Manage my medications take my medicines as prescribed; bring my medications to every visit; refill my medications on time  Monitor my health keep track of my blood glucose; check my feet daily  Eat healthy foods drink diet soda or water instead of juice or soda; eat more vegetables; eat foods that are low in salt; eat baked foods instead of fried foods  Be physically active take a walk every day    Home Blood Glucose Monitoring 02/26/2014  Check my blood sugar 3 times a day  When to check my blood sugar before meals     Care Management & Community Referrals:  Referral 04/24/2012  Referrals made for care management support none needed

## 2014-02-26 NOTE — Assessment & Plan Note (Signed)
BP Readings from Last 3 Encounters:  02/26/14 130/65  07/03/13 139/83  06/25/13 129/78   Lab Results  Component Value Date   NA 144 04/09/2012   K 4.1 04/09/2012   CREATININE 1.12* 04/09/2012   Assessment: Blood pressure control: controlled Progress toward BP goal:  at goal  Plan: Medications:  continue current medications norvasc 10mg  daily

## 2014-02-26 NOTE — Assessment & Plan Note (Signed)
a1c and eye exam done today.  Referral placed for mammogram Refused flu vaccine Likely candidate for prevnar with diabetes and likely CKD. Will need to discuss with patient or wait until 66 for repeat pneumococcal vaccine

## 2014-02-26 NOTE — Assessment & Plan Note (Addendum)
Lab Results  Component Value Date   HGBA1C 10.4 02/26/2014   HGBA1C 10.1 06/25/2013   HGBA1C 10.6 08/14/2012    Assessment: Diabetes control: poor control (HgbA1C >9%) Progress toward A1C goal:  deteriorated Comments: lots of stress lately, not following strict diet but compliant with insulin. No longer takes metformin due to rash. No meter today but admits to hypoglycemic episodes almost one/week mainly during the day when she doesn't eat very much or skips meals but still takes insulin  Plan: Medications:  change to NPH-N, 20 units 30 minutes before breakfast and 30 units 30 minutes before dinner. DO NOT TAKE INSULIN IF NOT EATING. HOLD if CBG's low Home glucose monitoring: Frequency: 3 times a day Timing: before meals Instruction/counseling given: reminded to get eye exam, reminded to bring blood glucose meter & log to each visit, reminded to bring medications to each visit and discussed diet Educational resources provided: handout Self management tools provided:   Other plans: follow up in 2 weeks, needs to also meet with CDE. Asked to call if sugars still running low and try to eat well balanced meals at least three times a day. Return with meter and cbg's three times a day. She is aware of methods to correct hypoglycemia. May consider another oral agent if blood sugars remain uncontrolled.  May get bmet next visit to monitor renal function

## 2014-02-26 NOTE — Progress Notes (Signed)
Subjective:   Patient ID: Mandy Mullen female   DOB: Feb 24, 1950 64 y.o.   MRN: [id number]  HPI: Mandy Mullen is a 64 y.o. female with DM2 uncontrolled and other PMH as listed below presenting to opc today for routine follow up visit.   DM2--A1C to be checked today.  She has been very stressed lately with the recent hospitalization of her son who was hit by a truck and driving back and forth from [place] where he is currently located.  As as result, she has not been following a strict diabetic diet, sometimes misses meals or eats snacks, but does usually have a full dinner.  She endorses low blood sugars mainly during the day and likely due to taking insulin without eating or not eating enough. Sometimes as low as 40s that she quickly corrects. Did not bring meter with her today. Hypoglycemic episodes almost weekly. She stopped metformin in June thinking it may be causing her recurrent rash that has not returned since she stopped metformin. She does not wish to restart.   She needs mammogram referral today and refuses flu vaccine again today.   Past Medical History  Diagnosis Date  . Diabetes mellitus   . Hypertension   . MRSA (methicillin resistant Staphylococcus aureus)     Reccurent MRSA abscesses  . Hyperlipidemia   . History of herpes genitalis   . Conjunctivitis of left eye     h/o   Current Outpatient Prescriptions  Medication Sig Dispense Refill  . amLODipine (NORVASC) 10 MG tablet Take 1 tablet (10 mg total) by mouth daily. 30 tablet 11  . diphenhydrAMINE (BENADRYL) 25 MG tablet Take 1 tablet (25 mg total) by mouth every 8 (eight) hours as needed for itching. 30 tablet 0  . glucose blood test strip Use as instructed 100 each 12  . insulin NPH-regular Human (NOVOLIN 70/30 RELION) (70-30) 100 UNIT/ML injection Take 30 units 30 minutes before breakfast. Take 30 units 30 minutes before dinner 30 mL 5  . loratadine (CLARITIN) 10 MG tablet Take 1 tablet (10 mg total) by  mouth daily. 30 tablet 2  . metFORMIN (GLUCOPHAGE) 1000 MG tablet TAKE ONE TABLET BY MOUTH TWICE DAILY WITH A MEAL 60 tablet 11  . Multiple Vitamin (MULTIVITAMIN) tablet Take 1 tablet by mouth daily.    . ONE TOUCH LANCETS MISC Use to check blood sugars 200 each 5  . pravastatin (PRAVACHOL) 40 MG tablet Take 1 tablet (40 mg total) by mouth daily. 30 tablet 11   No current facility-administered medications for this visit.   Family History  Problem Relation Age of Onset  . Coronary artery disease Father   . Diabetes Sister   . Stroke Sister    History   Social History  . Marital Status: Single    Spouse Name: N/A  . Number of Children: N/A  . Years of Education: N/A   Social History Main Topics  . Smoking status: Never Smoker   . Smokeless tobacco: Not on file  . Alcohol Use: No  . Drug Use: No  . Sexual Activity: Not on file   Other Topics Concern  . Not on file   Social History Narrative   Currently in school, studying nursing, will be done in 2 years   Single   Lives with son in [place]   Regular Exercise- yes- trying to go to [employer]  assistance application initiated.  Patient needs to submit further paperwork to complete per Bonna Gains 07/29/09.   Financial assistance approved for 100% discount at [facility] and has [facility] card per [employer] 07/06/09.   Review of Systems:  Constitutional:  Denies fever, chills  HEENT:  Denies congestion  Respiratory:  Denies SOB  Cardiovascular:  Denies chest pain  Gastrointestinal:  Denies nausea, vomiting, abdominal pain  Genitourinary:  Denies difficulty urinating.   Musculoskeletal:  Denies gait problem.   Skin:  Resolved rash but still has darkened patches of skin  Neurological:  Denies headaches.    Objective:  Physical Exam: Filed Vitals:   02/26/14 1025  BP: 130/65  Pulse: 76  Temp: 98 F (36.7 C)  TempSrc: Oral  Height: 5\' 3"  (1.6 m)  Weight: 187 lb 9.6 oz (85.095 kg)  SpO2: 98%    Vitals reviewed. General: sitting in chair, NAD HEENT: EOMI Cardiac: RRR Pulm: clear to auscultation bilaterally, no wheezes, rales, or rhonchi Abd: soft, BS present Ext: moving all extremities, -tenderness to palpation, darkened patches on extremities Neuro: alert and oriented X3  Assessment & Plan:  Discussed with Dr. Eppie Gibson

## 2014-03-10 ENCOUNTER — Encounter: Payer: Self-pay | Admitting: *Deleted

## 2014-03-31 ENCOUNTER — Telehealth: Payer: Self-pay | Admitting: Dietician

## 2014-03-31 NOTE — Telephone Encounter (Signed)
Call to patient to confirm appointment for 04/01/14 at 9:30 phone does not accept incoming calls

## 2014-04-01 ENCOUNTER — Encounter: Payer: Self-pay | Admitting: Dietician

## 2014-04-07 NOTE — Addendum Note (Signed)
Addended by: Hulan Fray on: 04/07/2014 07:15 PM   Modules accepted: Orders

## 2014-05-26 ENCOUNTER — Encounter (HOSPITAL_COMMUNITY): Payer: Self-pay | Admitting: Emergency Medicine

## 2014-05-26 ENCOUNTER — Emergency Department (INDEPENDENT_AMBULATORY_CARE_PROVIDER_SITE_OTHER)
Admission: EM | Admit: 2014-05-26 | Discharge: 2014-05-26 | Disposition: A | Discharge status: Home / Self Care | Payer: Self-pay | Source: Home / Self Care | Attending: Family Medicine | Admitting: Family Medicine

## 2014-05-26 DIAGNOSIS — T63481A Toxic effect of venom of other arthropod, accidental (unintentional), initial encounter: Secondary | ICD-10-CM

## 2014-05-26 MED ORDER — PREDNISONE 10 MG PO TABS: ORAL_TABLET | ORAL | Status: DC

## 2014-05-26 NOTE — ED Notes (Signed)
Pt states that she started itching in her head she believes she was bite by a insect

## 2014-05-26 NOTE — Discharge Instructions (Signed)
Insect Bite  Take Benadryl tonight. Tomorrow take Claritin or Zyrtec for itching.  Prednisone as directed. Start tonight.  Mosquitoes, flies, fleas, bedbugs, and other insects can bite. Insect bites are different from insect stings. The bite may be red, puffy (swollen), and itchy for 2 to 4 days. Most bites get better on their own. HOME CARE   Do not scratch the bite.  Keep the bite clean and dry. Wash the bite with soap and water.  Put ice on the bite.  Put ice in a plastic bag.  Place a towel between your skin and the bag.  Leave the ice on for 20 minutes, 4 times a day. Do this for the first 2 to 3 days, or as told by your doctor.  You may use medicated lotions or creams to lessen itching as told by your doctor.  Only take medicines as told by your doctor.  If you are given medicines (antibiotics), take them as told. Finish them even if you start to feel better. You may need a tetanus shot if:  You cannot remember when you had your last tetanus shot.  You have never had a tetanus shot.  The injury broke your skin. If you need a tetanus shot and you choose not to have one, you may get tetanus. Sickness from tetanus can be serious. GET HELP RIGHT AWAY IF:   You have more pain, redness, or puffiness.  You see a red line on the skin coming from the bite.  You have a fever.  You have joint pain.  You have a headache or neck pain.  You feel weak.  You have a rash.  You have chest pain, or you are short of breath.  You have belly (abdominal) pain.  You feel sick to your stomach (nauseous) or throw up (vomit).  You feel very tired or sleepy. MAKE SURE YOU:   Understand these instructions.  Will watch your condition.  Will get help right away if you are not doing well or get worse. Document Released: 01/01/2000 Document Revised: 03/28/2011 Document Reviewed: 08/04/2010 [facility] Patient Information 2015 [place], [place]. This information is not intended to  replace advice given to you by your health care provider. Make sure you discuss any questions you have with your health care provider.  Insect Bite Mosquitoes, flies, fleas, bedbugs, and many other insects can bite. Insect bites are different from insect stings. A sting is when venom is injected into the skin. Some insect bites can transmit infectious diseases. SYMPTOMS  Insect bites usually turn red, swell, and itch for 2 to 4 days. They often go away on their own. TREATMENT  Your caregiver may prescribe antibiotic medicines if a bacterial infection develops in the bite. HOME CARE INSTRUCTIONS  Do not scratch the bite area.  Keep the bite area clean and dry. Wash the bite area thoroughly with soap and water.  Put ice or cool compresses on the bite area.  Put ice in a plastic bag.  Place a towel between your skin and the bag.  Leave the ice on for 20 minutes, 4 times a day for the first 2 to 3 days, or as directed.  You may apply a baking soda paste, cortisone cream, or calamine lotion to the bite area as directed by your caregiver. This can help reduce itching and swelling.  Only take over-the-counter or prescription medicines as directed by your caregiver.  If you are given antibiotics, take them as directed. Finish them even if you  start to feel better. You may need a tetanus shot if:  You cannot remember when you had your last tetanus shot.  You have never had a tetanus shot.  The injury broke your skin. If you get a tetanus shot, your arm may swell, get red, and feel warm to the touch. This is common and not a problem. If you need a tetanus shot and you choose not to have one, there is a rare chance of getting tetanus. Sickness from tetanus can be serious. SEEK IMMEDIATE MEDICAL CARE IF:   You have increased pain, redness, or swelling in the bite area.  You see a red line on the skin coming from the bite.  You have a fever.  You have joint pain.  You have a headache  or neck pain.  You have unusual weakness.  You have a rash.  You have chest pain or shortness of breath.  You have abdominal pain, nausea, or vomiting.  You feel unusually tired or sleepy. MAKE SURE YOU:   Understand these instructions.  Will watch your condition.  Will get help right away if you are not doing well or get worse. Document Released: 02/11/2004 Document Revised: 03/28/2011 Document Reviewed: 08/04/2010 [facility] Patient Information 2015 [place], [place]. This information is not intended to replace advice given to you by your health care provider. Make sure you discuss any questions you have with your health care provider.

## 2014-05-26 NOTE — ED Provider Notes (Signed)
CSN: [id number]     Arrival date & time 05/26/14  1839 History   First MD Initiated Contact with Patient 05/26/14 1946     No chief complaint on file.  (Consider location/radiation/quality/duration/timing/severity/associated sxs/prior Treatment) HPI Comments: 64 year old female states that she felt something crawling or for head and brushed it off all she was cleaning. It looked like some sort of insect. Shortly afterward she developed some chills and generalized itching. She has a couple of small papules to the 4 head which she states also itches. She denies swelling, trouble breathing or rash.   Past Medical History  Diagnosis Date  . Diabetes mellitus   . Hypertension   . MRSA (methicillin resistant Staphylococcus aureus)     Reccurent MRSA abscesses  . Hyperlipidemia   . History of herpes genitalis   . Conjunctivitis of left eye     h/o   Past Surgical History  Procedure Laterality Date  . Abdominal hysterectomy  1991  . Gluteal abscess  4/06    S/P I and D   Family History  Problem Relation Age of Onset  . Coronary artery disease Father   . Diabetes Sister   . Stroke Sister    History  Substance Use Topics  . Smoking status: Never Smoker   . Smokeless tobacco: Not on file  . Alcohol Use: No   OB History    No data available     Review of Systems  Constitutional: Positive for chills.  HENT: Negative.   Respiratory: Negative for cough, chest tightness, shortness of breath and wheezing.   Cardiovascular: Negative.   Gastrointestinal: Negative.   Skin: Negative for color change and rash.  Neurological: Negative.     Allergies  Lisinopril and Eggs or egg-derived products  Home Medications   Prior to Admission medications   Medication Sig Start Date End Date Taking? Authorizing Provider  amLODipine (NORVASC) 10 MG tablet Take 1 tablet (10 mg total) by mouth daily. 06/25/13 06/25/14  Wilber Oliphant, MD  glucose blood test strip Use as instructed 08/14/12   Wilber Oliphant, MD  insulin NPH Human (NOVOLIN N) 100 UNIT/ML injection Take 20 units 74minutes before breakfast and 30 units 30 minutes before dinner. HOLD if not eating 02/26/14   Wilber Oliphant, MD  Multiple Vitamin (MULTIVITAMIN) tablet Take 1 tablet by mouth daily.    Historical Provider, MD  ONE TOUCH LANCETS MISC Use to check blood sugars 08/14/12   Wilber Oliphant, MD  pravastatin (PRAVACHOL) 40 MG tablet Take 1 tablet (40 mg total) by mouth daily. 06/25/13   Wilber Oliphant, MD  predniSONE (DELTASONE) 10 MG tablet Take 2 tablets for 3 days then 1 tablet for 2 days 05/26/14   Janne Napoleon, NP   BP 173/82 mmHg  Pulse 71  Temp(Src) 98.8 F (37.1 C) (Oral)  Resp 17  SpO2 95% Physical Exam  Constitutional: She is oriented to person, place, and time. She appears well-developed and well-nourished. No distress.  HENT:  Mouth/Throat: Oropharynx is clear and moist. No oropharyngeal exudate.  No intraoral swelling. No soft palate edema. Tongue is of normal size. No buccal swelling or lesions. Normal swallow reflex.  Eyes: Conjunctivae and EOM are normal.  Neck: Normal range of motion. Neck supple.  Cardiovascular: Normal rate, regular rhythm and normal heart sounds.   Pulmonary/Chest: Effort normal and breath sounds normal. No respiratory distress. She has no wheezes. She has no rales.  Musculoskeletal: She exhibits no edema.  Lymphadenopathy:    She has no cervical adenopathy.  Neurological: She is alert and oriented to person, place, and time. She exhibits normal muscle tone.  Skin: Skin is warm and dry. No rash noted.  Nursing note and vitals reviewed.   ED Course  Procedures (including critical care time) Labs Review Labs Reviewed - No data to display  Imaging Review No results found.   MDM   1. Allergic reaction to insect sting, accidental or unintentional, initial encounter    Physical exam unremarkable. No current chills. Her only complaint is of itching. No rash can be  identified. Take Benadryl 25 mg tonight. Tomorrow may take loratadine or Zyrtec. Start taking prednisone 20 mg today in complete tablets as directed. For worsening new symptoms or problems as we discussed may need to return or go to the emergency department.    Janne Napoleon, NP 05/26/14 2007

## 2014-05-29 ENCOUNTER — Encounter: Payer: Self-pay | Admitting: *Deleted

## 2014-07-31 ENCOUNTER — Other Ambulatory Visit: Payer: Self-pay | Admitting: Internal Medicine

## 2014-07-31 DIAGNOSIS — E785 Hyperlipidemia, unspecified: Secondary | ICD-10-CM

## 2014-07-31 DIAGNOSIS — I10 Essential (primary) hypertension: Secondary | ICD-10-CM

## 2014-08-26 NOTE — Telephone Encounter (Signed)
Patient called asking about these refills.

## 2014-12-02 ENCOUNTER — Encounter: Payer: Self-pay | Admitting: Student

## 2014-12-07 ENCOUNTER — Other Ambulatory Visit: Payer: Self-pay | Admitting: Internal Medicine

## 2014-12-07 DIAGNOSIS — E785 Hyperlipidemia, unspecified: Secondary | ICD-10-CM

## 2014-12-07 DIAGNOSIS — I10 Essential (primary) hypertension: Secondary | ICD-10-CM

## 2015-01-07 ENCOUNTER — Encounter: Payer: Self-pay | Admitting: Internal Medicine

## 2015-01-08 ENCOUNTER — Encounter: Payer: Self-pay | Admitting: Internal Medicine

## 2015-01-08 ENCOUNTER — Ambulatory Visit (INDEPENDENT_AMBULATORY_CARE_PROVIDER_SITE_OTHER): Payer: Self-pay | Admitting: Internal Medicine

## 2015-01-08 VITALS — BP 145/59 | HR 81 | Temp 98.5°F | Ht 63.0 in | Wt 186.9 lb

## 2015-01-08 DIAGNOSIS — I10 Essential (primary) hypertension: Secondary | ICD-10-CM

## 2015-01-08 DIAGNOSIS — E785 Hyperlipidemia, unspecified: Secondary | ICD-10-CM

## 2015-01-08 DIAGNOSIS — E1165 Type 2 diabetes mellitus with hyperglycemia: Secondary | ICD-10-CM

## 2015-01-08 DIAGNOSIS — IMO0001 Reserved for inherently not codable concepts without codable children: Secondary | ICD-10-CM

## 2015-01-08 DIAGNOSIS — Z794 Long term (current) use of insulin: Secondary | ICD-10-CM

## 2015-01-08 LAB — GLUCOSE, CAPILLARY: GLUCOSE-CAPILLARY: 303 mg/dL — AB (ref 65–99)

## 2015-01-08 LAB — POCT GLYCOSYLATED HEMOGLOBIN (HGB A1C): HEMOGLOBIN A1C: 11.1

## 2015-01-08 MED ORDER — AMLODIPINE BESYLATE 10 MG PO TABS: 10.0000 mg | ORAL_TABLET | Freq: Every day | ORAL | Status: DC

## 2015-01-08 MED ORDER — PRAVASTATIN SODIUM 40 MG PO TABS: 40.0000 mg | ORAL_TABLET | Freq: Every day | ORAL | Status: DC

## 2015-01-08 NOTE — Patient Instructions (Signed)
Mandy Mullen, it was a pleasure to meet you. We talked about your diabetes, hypertension, and high cholesterol today. I will call you if your A1c is too high, and in that case, we may need to adjust your insulin dose. You are due for several screening tests, but we will wait until the new year when you have insurance.   Happy Holidays.

## 2015-01-08 NOTE — Progress Notes (Signed)
   Subjective:    Patient ID: Mandy Mullen, female    DOB: May 03, 1950, 64 y.o.   MRN: [id number]  HPI  Mandy Mullen is a 64 year old woman with a PMH of T2DM, HTN, HLD who comes for a general wellness visit. She reports difficulty controlling her diet over the previous year due to a significant stressor in her life, her son being in the ICU. She says her BGs are routinely in the 200s-300s. She says she regularly takes her insulint 30U NPH 30 minutes before breakfast and another 30U NPH 30 minutes before dinner, understanding that she must hold the NPH if she does not plan on eating. Her diet has been mostly fast food since her time has been limited and she has been stressed. She has spoken with a diabetes counselor before in our clinic, but she is not interested in meeting with one again. She denies any pain or tingling in her extremities. Due to her stressors, she has not been taking her amlodipine or pravastatin since October 2016.  She understands that she is due for several screening tests (e.g., mammography, colonoscopy), but would prefer to wait until January 2017 when she has new insurance.  Review of Systems  Constitutional: Negative for fever, fatigue and unexpected weight change.  HENT: Negative for congestion, sinus pressure and sore throat.   Eyes: Negative for visual disturbance.  Respiratory: Negative for cough and shortness of breath.   Cardiovascular: Negative for chest pain and leg swelling.  Gastrointestinal: Negative for nausea, abdominal pain, diarrhea and constipation.  Endocrine: Negative for polyuria.  Genitourinary: Negative for dysuria and urgency.  Neurological: Negative for dizziness, light-headedness and headaches.  Psychiatric/Behavioral: Negative for dysphoric mood.       Objective:   Physical Exam  Constitutional: She appears well-developed and well-nourished. No distress.  HENT:  Mouth/Throat: Oropharynx is clear and moist. No oropharyngeal exudate.  Eyes:  EOM are normal. No scleral icterus.  Neck: No JVD present.  Cardiovascular: Normal rate, regular rhythm and normal heart sounds.   Pulmonary/Chest: Effort normal and breath sounds normal. No respiratory distress. She has no wheezes.  Abdominal: Soft. Bowel sounds are normal. She exhibits no distension. There is no tenderness.  Musculoskeletal: She exhibits no edema.  Lymphadenopathy:    She has no cervical adenopathy.  Neurological: She is alert. She has normal reflexes.  Symmetrical, in tact sensation in distal extremities. Toe proprioception in tact.  Skin: Skin is warm and dry.  No sores or skin breakdown on feet.  Psychiatric: She has a normal mood and affect. Her behavior is normal.  Vitals reviewed.         Assessment & Plan:  Please see problem-based assessment and plan for details.

## 2015-01-08 NOTE — Assessment & Plan Note (Addendum)
Assessment: Patient has not been taking amlodipine since October due to stressors in her life. Her blood pressure today is slightly elevated at 145/59, whereas she was controlled in February at 130/65.    Plan: - Restart amlodipine 10 mg daily

## 2015-01-09 ENCOUNTER — Telehealth: Payer: Self-pay | Admitting: Internal Medicine

## 2015-01-09 MED ORDER — INSULIN NPH (HUMAN) (ISOPHANE) 100 UNIT/ML ~~LOC~~ SUSP: SUBCUTANEOUS | Status: DC

## 2015-01-09 NOTE — Assessment & Plan Note (Deleted)
Assessment:     Plan:

## 2015-01-09 NOTE — Telephone Encounter (Signed)
I called Mandy Mullen to notify her of her Hgb A1c of 11.1, and increase from 10.4 in 02/2014. She re-confirmed that she had been taking her NPH 30U 30 minutes before breakfast and 30U 30 minutes before dinner, denying any episodes of symptomatic hypoglycemia. After discussion, [name] and I agreed to increase this dose to 33U before breakfast and dinner, understanding that she should skip her insulin if she planned in skipping breakfast or dinner. I reminded her that I would like to follow up with her in a month to discuss screening options once she has insurance.

## 2015-01-09 NOTE — Assessment & Plan Note (Addendum)
Assessment: Diabetes control: poor control (HgbA1C >9%) Progress toward A1C goal: deteriorated. A1c increased from 10.4 in 02/2014 to 11.1 today Comments: lots of stress lately and has been eating fast food frequently. She has been taking NPH 30U 30 minutes before breakfast and dinner. This is different that what is prescribed, NPH 20U 30 min before breakfast and NPH 30U in PM. She has not taken any NPH if she does not plan on eating and denies any hypoglycemic episodes. No meter today, but says her BGs are 200s-300s throughout the day. No longer takes metformin due to rash.  Plan: Medications: change to NPH, 33 units 30 minutes before breakfast and 33 units 30 minutes before dinner. DO NOT TAKE INSULIN IF NOT EATING. Home glucose monitoring: Frequency: 3 times a day Timing: before meals Instruction/counseling given: reminded to get eye exam, reminded to bring blood glucose meter & log to each visit, reminded to bring medications to each visit and discussed diet. Told her we would check renal function next month after she has insurance. She indicates that she would rather wait to have these tests covered by insurance.  Self management tools provided:   Other plans: follow up in 1 month in clinic. Not interested in meeting with diabetes counselor at this time.

## 2015-01-09 NOTE — Addendum Note (Signed)
Addended by: Zetta Bills on: 01/09/2015 10:41 AM   Modules accepted: Level of Service

## 2015-01-09 NOTE — Assessment & Plan Note (Signed)
Assessment: Patient reports poor diet control.   Plan: Will obtain lipid panel at next visit when she has insurance. Refill pravastatin.

## 2015-01-12 NOTE — Progress Notes (Signed)
I saw and evaluated the patient.  I personally confirmed the key portions of Dr. Ford's history and exam and reviewed pertinent patient test results.  The assessment, diagnosis, and plan were formulated together and I agree with the documentation in the resident's note. 

## 2015-02-11 ENCOUNTER — Telehealth: Payer: Self-pay | Admitting: Internal Medicine

## 2015-02-11 NOTE — Telephone Encounter (Signed)
Call to patient to confirm appointment for 02/12/15 at 1:30 lmtcb

## 2015-02-12 ENCOUNTER — Encounter: Payer: Self-pay | Admitting: Internal Medicine

## 2015-06-08 ENCOUNTER — Other Ambulatory Visit (HOSPITAL_COMMUNITY): Payer: Self-pay | Admitting: Oral and Maxillofacial Surgery

## 2015-06-08 DIAGNOSIS — M272 Inflammatory conditions of jaws: Secondary | ICD-10-CM

## 2015-06-09 ENCOUNTER — Ambulatory Visit (HOSPITAL_COMMUNITY)
Admission: RE | Admit: 2015-06-09 | Discharge: 2015-06-09 | Disposition: A | Discharge status: Home / Self Care | Payer: Self-pay | Source: Ambulatory Visit | Attending: Oral and Maxillofacial Surgery | Admitting: Oral and Maxillofacial Surgery

## 2015-06-09 ENCOUNTER — Encounter (HOSPITAL_COMMUNITY): Payer: Self-pay

## 2015-06-09 DIAGNOSIS — M272 Inflammatory conditions of jaws: Secondary | ICD-10-CM | POA: Insufficient documentation

## 2015-06-09 LAB — POCT I-STAT CREATININE: Creatinine, Ser: 1.2 mg/dL — ABNORMAL HIGH (ref 0.44–1.00)

## 2015-06-09 MED ORDER — IOPAMIDOL (ISOVUE-300) INJECTION 61%
75.0000 mL | Freq: Once | INTRAVENOUS | Status: AC | PRN
  Administered 2015-06-09: 75 mL via INTRAVENOUS

## 2015-07-14 ENCOUNTER — Encounter: Payer: Self-pay | Admitting: *Deleted

## 2016-03-04 ENCOUNTER — Encounter: Payer: Self-pay | Admitting: Internal Medicine

## 2016-05-24 ENCOUNTER — Other Ambulatory Visit: Payer: Self-pay

## 2016-05-24 DIAGNOSIS — I10 Essential (primary) hypertension: Secondary | ICD-10-CM

## 2016-05-24 DIAGNOSIS — E7849 Other hyperlipidemia: Secondary | ICD-10-CM

## 2016-05-24 MED ORDER — AMLODIPINE BESYLATE 10 MG PO TABS: 10.0000 mg | ORAL_TABLET | Freq: Every day | ORAL | 5 refills | Status: DC

## 2016-05-24 MED ORDER — PRAVASTATIN SODIUM 40 MG PO TABS: 40.0000 mg | ORAL_TABLET | Freq: Every day | ORAL | 5 refills | Status: DC

## 2016-05-24 NOTE — Telephone Encounter (Signed)
Patient walk in reports she needs amlodipine and pravastatin advised patient that she has not been seen 2016 she reports the doctor gave her a lot of refills so didn't need to come back but she will run out at the end of the month. She scheduled appointment for July

## 2016-05-25 NOTE — Telephone Encounter (Signed)
Amlodipine and Pravastatin called in to pharmacy voicemail

## 2016-07-26 ENCOUNTER — Ambulatory Visit (INDEPENDENT_AMBULATORY_CARE_PROVIDER_SITE_OTHER): Payer: Self-pay | Admitting: Internal Medicine

## 2016-07-26 VITALS — BP 141/68 | HR 87 | Temp 98.6°F | Ht 63.0 in | Wt 180.9 lb

## 2016-07-26 DIAGNOSIS — I129 Hypertensive chronic kidney disease with stage 1 through stage 4 chronic kidney disease, or unspecified chronic kidney disease: Secondary | ICD-10-CM

## 2016-07-26 DIAGNOSIS — E1122 Type 2 diabetes mellitus with diabetic chronic kidney disease: Secondary | ICD-10-CM

## 2016-07-26 DIAGNOSIS — Z794 Long term (current) use of insulin: Secondary | ICD-10-CM

## 2016-07-26 DIAGNOSIS — Z1211 Encounter for screening for malignant neoplasm of colon: Secondary | ICD-10-CM

## 2016-07-26 DIAGNOSIS — IMO0001 Reserved for inherently not codable concepts without codable children: Secondary | ICD-10-CM

## 2016-07-26 DIAGNOSIS — Z0000 Encounter for general adult medical examination without abnormal findings: Secondary | ICD-10-CM

## 2016-07-26 DIAGNOSIS — N183 Chronic kidney disease, stage 3 (moderate): Secondary | ICD-10-CM

## 2016-07-26 DIAGNOSIS — E1165 Type 2 diabetes mellitus with hyperglycemia: Secondary | ICD-10-CM

## 2016-07-26 DIAGNOSIS — I10 Essential (primary) hypertension: Secondary | ICD-10-CM

## 2016-07-26 LAB — GLUCOSE, CAPILLARY: GLUCOSE-CAPILLARY: 202 mg/dL — AB (ref 65–99)

## 2016-07-26 LAB — POCT GLYCOSYLATED HEMOGLOBIN (HGB A1C): HEMOGLOBIN A1C: 10

## 2016-07-26 NOTE — Patient Instructions (Addendum)
Ms. Milford,   Someone from the clinic will call you with the results of your lab tests today For your low blood sugar Continue taking Novolin 30 units in the morning, start taking 25 units instead of 30 at night  Please complete these colon cancer screening cards and mail them back to the clinic  Please return to the clinic in 1-2 weeks with your blood glucose meter

## 2016-07-26 NOTE — Progress Notes (Signed)
CC: follow up of diabetes and hypertension  HPI:  [name] is a 66 y.o. past medical history of angina, hyperlipidemia, uncontrolled insulin-dependent type 2 diabetes, and hypertension who presents to [facility] for follow up of diabetes and hypertension.   Past Medical History:  Diagnosis Date  . Conjunctivitis of left eye    h/o  . Diabetes mellitus   . History of herpes genitalis   . Hyperlipidemia   . Hypertension   . MRSA (methicillin resistant Staphylococcus aureus)    Reccurent MRSA abscesses   Review of Systems: Refer to history of present illness and assessment and plans for pertinent review of systems, all others reviewed and negative  Physical Exam:  Vitals:   07/26/16 1508  BP: (!) 141/68  Pulse: 87  Temp: 98.6 F (37 C)  TempSrc: Oral  SpO2: 98%  Weight: 180 lb 14.4 oz (82.1 kg)  Height: 5\' 3"  (1.6 m)   Physical Exam  Constitutional: She is oriented to person, place, and time.  Neck: Normal range of motion.  Cardiovascular: Normal rate, regular rhythm and intact distal pulses.   No murmur heard. no peripheral edema  Pulmonary/Chest: Effort normal and breath sounds normal. No respiratory distress. She has no wheezes.  Neurological: She is alert and oriented to person, place, and time.  Skin: Skin is warm and dry.  No ulcers or lesions on bilateral feet   Psychiatric: Affect and judgment normal.   Assessment & Plan:   Uncontrolled type 2 diabetes  Last A1c in december 2016 was 11. She reports good compliance with insulin, she has been picking this up over-the-counter at [facility]. Forgot her meter today but says that her average blood sugars are in the 150 s however sometimes when she wakes up in the morning the glucose are in the 60s. Describes a new burning in her feet under her left great toe. She is good about checking her feet and has not seen any lesions or ulcers. - A1c today 10, only slightly improved from 11 at prior in 2016.    -urine microalbumin today > 5.8, continue to monitor  - Last ophthalmology exam 02/2014 showed no retinopathy >> referral for eye exam in our office today  -Performed foot exam today >> no signs of sensory deficit - Continue novolin 70/30 30 units q AM, Decreased Novolin 70/30 to 25 units qPM (was previously taking 30 qPM)  -Return to clinic in one month with meter then every 3 months after that   Hypertension BP Readings from Last 3 Encounters:  07/26/16 (!) 141/68  01/08/15 (!) 145/59  05/26/14 173/82  Blood pressure is not controlled today.Patient reports she has not taken amlodipine for the past month. Last BMP showed an elevated crt but no electrolyte abnormalities.  - Encouraged to take amlodipine 10 mg daily  -ordered BMP today >  no electrolyte abnormalities, will continue with current BP regiment  - RTC in 1 week for DM, will recheck BP at that time   CKD stage 3  Screening BMP today showed worsening of her GFR to 57 was not found on last screening in 2013. She also has a creatinine of 1.17 up  From 0.9 at that time.  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  - Will need to continue to encourage better blood glucose control, hypertension control, and most importantly follow up   Health maintenance  - FIT testing today   See Encounters  Tab for problem based charting.  Patient discussed with Dr. Dareen Piano

## 2016-07-27 LAB — MICROALBUMIN / CREATININE URINE RATIO
Creatinine, Urine: 184.6 mg/dL
Microalb/Creat Ratio: 5.8 mg/g creat (ref 0.0–30.0)
Microalbumin, Urine: 10.7 ug/mL

## 2016-07-27 LAB — BMP8+ANION GAP
Anion Gap: 13 mmol/L (ref 10.0–18.0)
BUN/Creatinine Ratio: 21 (ref 12–28)
BUN: 25 mg/dL (ref 8–27)
CALCIUM: 9.4 mg/dL (ref 8.7–10.3)
CHLORIDE: 102 mmol/L (ref 96–106)
CO2: 25 mmol/L (ref 20–29)
Creatinine, Ser: 1.17 mg/dL — ABNORMAL HIGH (ref 0.57–1.00)
GFR, EST AFRICAN AMERICAN: 57 mL/min/{1.73_m2} — AB (ref >59)
GFR, EST NON AFRICAN AMERICAN: 49 mL/min/{1.73_m2} — AB (ref >59)
Glucose: 215 mg/dL — ABNORMAL HIGH (ref 65–99)
Potassium: 4.4 mmol/L (ref 3.5–5.2)
Sodium: 140 mmol/L (ref 134–144)

## 2016-07-29 ENCOUNTER — Encounter: Payer: Self-pay | Admitting: Internal Medicine

## 2016-07-29 DIAGNOSIS — E1122 Type 2 diabetes mellitus with diabetic chronic kidney disease: Secondary | ICD-10-CM | POA: Insufficient documentation

## 2016-07-29 DIAGNOSIS — N182 Chronic kidney disease, stage 2 (mild): Secondary | ICD-10-CM

## 2016-07-29 DIAGNOSIS — N183 Chronic kidney disease, stage 3 unspecified: Secondary | ICD-10-CM | POA: Insufficient documentation

## 2016-07-29 NOTE — Assessment & Plan Note (Signed)
Last A1c in december 2016 was 11. She reports good compliance with insulin, she has been picking this up over-the-counter at [facility]. Forgot her meter today but says that her average blood sugars are in the 150s however sometimes when she wakes up in the morning the glucose are in the 60s. Describes a new burning in her feet under her left great toe. She is good about checking her feet and has not seen any lesions or ulcers. - A1c today 10  -urine microalbumin today  - Last ophthalmology exam 02/2014 showed no retinopathy >> referral for eye exam today  -Performed foot exam today >> no signs of sensory deficit - Continue novolin 70/30 30 units q AM, Decreased Novolin 70/30 to 25 units qPM ( was previously taking 30 qPM )  -Return to clinic in one week with meter then every 3 months after that

## 2016-07-29 NOTE — Assessment & Plan Note (Signed)
-   FIT testing today

## 2016-07-29 NOTE — Assessment & Plan Note (Signed)
BP Readings from Last 3 Encounters:  07/26/16 (!) 141/68  01/08/15 (!) 145/59  05/26/14 173/82  Blood pressure is not controlled today.Patient reports she has not taken amlodipine for the past month.  - Encouraged to take amlodipine 10 mg daily  - RTC in 1 week for DM, will recheck BP at that time

## 2016-07-29 NOTE — Assessment & Plan Note (Addendum)
Screening BMP today showed worsening of her GFR to 57 was not found on last screening in 2013. She also has a creatinine of 1.17 up  From 0.9 at that time.  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  - Will need to continue to encourage better blood glucose control, hypertension control, and regular clinic follow up

## 2016-08-01 ENCOUNTER — Other Ambulatory Visit: Payer: Self-pay | Admitting: *Deleted

## 2016-08-01 DIAGNOSIS — I10 Essential (primary) hypertension: Secondary | ICD-10-CM

## 2016-08-01 DIAGNOSIS — E7849 Other hyperlipidemia: Secondary | ICD-10-CM

## 2016-08-01 MED ORDER — PRAVASTATIN SODIUM 40 MG PO TABS: 40.0000 mg | ORAL_TABLET | Freq: Every day | ORAL | 5 refills | Status: DC

## 2016-08-01 MED ORDER — AMLODIPINE BESYLATE 10 MG PO TABS: 10.0000 mg | ORAL_TABLET | Freq: Every day | ORAL | 5 refills | Status: DC

## 2016-08-01 NOTE — Telephone Encounter (Signed)
Pt is calling back to speak with a nurse about meds. Please call back.

## 2016-08-02 NOTE — Progress Notes (Signed)
Internal Medicine Clinic Attending  Case discussed with Dr. Blum at the time of the visit.  We reviewed the resident's history and exam and pertinent patient test results.  I agree with the assessment, diagnosis, and plan of care documented in the resident's note. 

## 2016-08-03 ENCOUNTER — Ambulatory Visit (INDEPENDENT_AMBULATORY_CARE_PROVIDER_SITE_OTHER): Payer: Self-pay | Admitting: Internal Medicine

## 2016-08-03 DIAGNOSIS — IMO0001 Reserved for inherently not codable concepts without codable children: Secondary | ICD-10-CM

## 2016-08-03 DIAGNOSIS — E1165 Type 2 diabetes mellitus with hyperglycemia: Principal | ICD-10-CM

## 2016-08-03 DIAGNOSIS — Z794 Long term (current) use of insulin: Secondary | ICD-10-CM

## 2016-08-03 DIAGNOSIS — Z833 Family history of diabetes mellitus: Secondary | ICD-10-CM

## 2016-08-03 DIAGNOSIS — E11649 Type 2 diabetes mellitus with hypoglycemia without coma: Secondary | ICD-10-CM

## 2016-08-03 NOTE — Progress Notes (Signed)
   CC: Follow-up of diabetes  HPI:  [name] is a 66 y.o. HPI with PMH as listed below who presents for follow up of diabetes mellitus. Please see the assessment and plans for the status of the patient chronic medical problems.   Past Medical History:  Diagnosis Date  . Diabetes mellitus   . History of herpes genitalis   . Hyperlipidemia   . Hypertension   . MRSA (methicillin resistant Staphylococcus aureus)    Reccurent MRSA abscesses   Review of Systems:  Refer to history of present illness and assessment and plans for pertinent review of systems, all others reviewed and negative  Physical Exam:  Vitals:   08/03/16 1321  BP: (!) 154/70  Pulse: 87  Temp: 98 F (36.7 C)  TempSrc: Oral  SpO2: 98%  Weight: 183 lb 9.6 oz (83.3 kg)  Height: 5\' 3"  (1.6 m)   Physical Exam  Constitutional: She is oriented to person, place, and time and well-developed, well-nourished, and in no distress. No distress.  HENT:  Head: Normocephalic and atraumatic.  Eyes: Conjunctivae are normal. Right eye exhibits no discharge. Left eye exhibits no discharge. No scleral icterus.  Neck: Normal range of motion.  Pulmonary/Chest: Effort normal. No respiratory distress.  Neurological: She is alert and oriented to person, place, and time.  Skin: Skin is warm and dry. She is not diaphoretic.  Psychiatric: Affect and judgment normal.   Assessment & Plan:   Diabetes Mellitus  At office visit last week the patient described hypoglycemia in the 60s overnight. A1c was 10 at that time Insulin regiment was decreased and she was asked to follow up in clinic today. Today she has brought her meter, she has checked her blood glucose 10 times in the past month. There are 8 morning readings ranging from 51-157, 5 of these are <80. There is one reading during sleep that was 66. There is one reading in the evening that was 336, this was on a day when she went to a picnic and ate watermelon. Given her frequent  low blood sugars I will decrease her regiment today. She has agreed to monitor her blood sugar more regularly and will call if she continues to have readings <80.  - decreased novolin 70/30 to 25 units 30 min prior to breakfast and 15 units 30 min prior to dinner (was previously 30 units qAM, 25 qPM)  - Start blood glucose checking at least 3 times daily  -RTC in 2 weeks   Uninsured status: She just received paperwork to sign up for medicare and has submitted this. At follow up we will address health care maintenance.   See Encounters Tab for problem based charting.  Patient discussed with Dr. Lynnae January

## 2016-08-03 NOTE — Patient Instructions (Addendum)
Mandy Mullen,   It was a pleasure to see you today.   I am concerned about your low blood sugars,  Please STOP taking Novolin 30 units twice daily, instead take 25 units in the morning and 15 units in the evening  If you have a low blood sugar please have a single serve packet of sugar.   Please schedule a follow up in the acute care clinic on July 30th

## 2016-08-05 NOTE — Assessment & Plan Note (Signed)
At office visit last week the patient described hypoglycemia in the 60s overnight. A1c was 10 at that time Insulin regiment was decreased and she was asked to follow up in clinic today. Today she has brought her meter, she has checked her blood glucose 10 times in the past month. There are 8 morning readings ranging from 51-157, 5 of these are <80. There is one reading during sleep that was 66. There is one reading in the evening that was 336, this was on a day when she went to a picnic and ate watermelon. Given her frequent low blood sugars I will decrease her regiment today. She has agreed to monitor her blood sugar more regularly and will call if she continues to have readings <80.  - decreased novolin 70/30 to 25 units 30 min prior to breakfast and 15 units 30 min prior to dinner (was previously 30 units qAM, 25 qPM)  - Start blood glucose checking at least 3 times daily  -RTC in 2 weeks

## 2016-08-08 NOTE — Progress Notes (Signed)
Internal Medicine Clinic Attending  Case discussed with Dr. Blum at the time of the visit.  We reviewed the resident's history and exam and pertinent patient test results.  I agree with the assessment, diagnosis, and plan of care documented in the resident's note. 

## 2016-08-15 ENCOUNTER — Ambulatory Visit: Payer: Self-pay

## 2016-08-24 NOTE — Addendum Note (Signed)
Addended by: Truddie Crumble on: 08/24/2016 02:12 PM   Modules accepted: Orders

## 2016-08-25 ENCOUNTER — Other Ambulatory Visit (INDEPENDENT_AMBULATORY_CARE_PROVIDER_SITE_OTHER): Payer: Self-pay

## 2016-08-25 ENCOUNTER — Telehealth: Payer: Self-pay | Admitting: *Deleted

## 2016-08-25 DIAGNOSIS — Z1211 Encounter for screening for malignant neoplasm of colon: Secondary | ICD-10-CM

## 2016-08-25 LAB — POC HEMOCCULT BLD/STL (HOME/3-CARD/SCREEN)
Card #2 Fecal Occult Blod, POC: NEGATIVE
FECAL OCCULT BLD: NEGATIVE
Fecal Occult Blood, POC: NEGATIVE

## 2016-08-25 NOTE — Telephone Encounter (Signed)
Received Hemoccult Cards by mail 08-24-2016  Called patient 08-24-16 at 14:32  to verify collection date. No answer. Left voicemail for Mandy Mullen to return my call [phone]  Called patient 08-25-16 at 11:19. 2nd attempt to reach patient to verify collection dates.  Left voicemail for patient to return call to the [place], PBT 08-25-16  11:26

## 2017-03-13 ENCOUNTER — Encounter: Payer: Self-pay | Admitting: Internal Medicine

## 2017-06-19 ENCOUNTER — Ambulatory Visit: Payer: Self-pay | Admitting: Internal Medicine

## 2017-06-19 ENCOUNTER — Other Ambulatory Visit: Payer: Self-pay

## 2017-06-19 VITALS — BP 141/70 | HR 97 | Wt 182.4 lb

## 2017-06-19 DIAGNOSIS — IMO0001 Reserved for inherently not codable concepts without codable children: Secondary | ICD-10-CM

## 2017-06-19 DIAGNOSIS — I10 Essential (primary) hypertension: Secondary | ICD-10-CM

## 2017-06-19 DIAGNOSIS — E1165 Type 2 diabetes mellitus with hyperglycemia: Secondary | ICD-10-CM

## 2017-06-19 DIAGNOSIS — Z79899 Other long term (current) drug therapy: Secondary | ICD-10-CM

## 2017-06-19 DIAGNOSIS — G5711 Meralgia paresthetica, right lower limb: Secondary | ICD-10-CM | POA: Insufficient documentation

## 2017-06-19 DIAGNOSIS — E785 Hyperlipidemia, unspecified: Secondary | ICD-10-CM

## 2017-06-19 DIAGNOSIS — Z794 Long term (current) use of insulin: Secondary | ICD-10-CM

## 2017-06-19 DIAGNOSIS — B351 Tinea unguium: Secondary | ICD-10-CM | POA: Insufficient documentation

## 2017-06-19 DIAGNOSIS — E7849 Other hyperlipidemia: Secondary | ICD-10-CM

## 2017-06-19 DIAGNOSIS — E118 Type 2 diabetes mellitus with unspecified complications: Secondary | ICD-10-CM

## 2017-06-19 LAB — POCT GLYCOSYLATED HEMOGLOBIN (HGB A1C): HEMOGLOBIN A1C: 10.1 % — AB (ref 4.0–5.6)

## 2017-06-19 LAB — GLUCOSE, CAPILLARY: Glucose-Capillary: 289 mg/dL — ABNORMAL HIGH (ref 65–99)

## 2017-06-19 MED ORDER — INSULIN NPH (HUMAN) (ISOPHANE) 100 UNIT/ML ~~LOC~~ SUSP: SUBCUTANEOUS | 5 refills | Status: DC

## 2017-06-19 MED ORDER — AMLODIPINE BESYLATE 10 MG PO TABS: 10.0000 mg | ORAL_TABLET | Freq: Every day | ORAL | 1 refills | Status: DC

## 2017-06-19 MED ORDER — SITAGLIP PHOS-METFORMIN HCL ER 50-1000 MG PO TB24: 2.0000 | ORAL_TABLET | Freq: Every day | ORAL | 0 refills | Status: DC

## 2017-06-19 MED ORDER — LOSARTAN POTASSIUM 50 MG PO TABS: 50.0000 mg | ORAL_TABLET | Freq: Every day | ORAL | 0 refills | Status: DC

## 2017-06-19 MED ORDER — PRAVASTATIN SODIUM 40 MG PO TABS: 40.0000 mg | ORAL_TABLET | Freq: Every day | ORAL | 1 refills | Status: DC

## 2017-06-19 MED ORDER — SITAGLIP PHOS-METFORMIN HCL ER 50-500 MG PO TB24: ORAL_TABLET | ORAL | 0 refills | Status: DC

## 2017-06-19 NOTE — Assessment & Plan Note (Signed)
Stable - Refilled pravastatin

## 2017-06-19 NOTE — Progress Notes (Signed)
   CC: management of diabetes  HPI:  [name] is a 67 y.o. female with PMH of diabetes and HTN who presents for management of diabetes.  She was last seen in clinic in 07/2016 and noted to have episodes of hypoglycemia, thus her insulin regimen was decreased to Novolin 70/30 25u qAM, 15u qPM. A1c at that time was 10.0. She has not been back to clinic since that time.   Diabetes: She denies abdominal pain, N/V, or blurry vision. She has been taking Novolin 70/30 30u qAM and 20u qPM. Since being on this dose, she has had 4-5 episodes per month of hypoglycemia in the morning when she wakes up, which she states occurs when she does not eat enough for dinner at night and still takes the 20 units. She occasionally will try to eat a snack before bedtime to mitigate the insulin, but oftentimes, this is not enough to prevent the hypoglycemia. She does not have her glucometer with her today. She reports the highest BG reading she has gotten is 320. She was previously on metformin, but this was discontinued several years ago due to patient's concern that it was causing a rash. It does not appear that she has tried other oral medications.  HTN: She denies chest pain, SOB, or headaches. She reports compliance with amlodipine. BP today is 141/70, HR 97. She had lip swelling and rash while on lisinopril, which did not require hospitalization. After discontinuation of lisinopril in the clinic, the lip swelling and rash resolved.  Right lateral leg numbness: About 1 month ago, she noted onset of right lateral leg numbness and occasional sharp shooting pains in the area. She denies weakness in her lower extremity and has had no issues walking. She has never had this before. She denies wearing extremely tight fitting clothing. BMI 32.6  Right first toe discoloration: About 1 month ago, she also noted onset of discoloration of her right first toe. It is not painful to touch.  Please see the assessment and  plan below for the status of the patient's chronic medical problems.  Past Medical History:  Diagnosis Date  . Diabetes mellitus   . History of herpes genitalis   . Hyperlipidemia   . Hypertension   . MRSA (methicillin resistant Staphylococcus aureus)    Reccurent MRSA abscesses   Review of Systems:  GEN: Negative for fevers CV: Negative for chest pain  PULM: Negative for SOB ABD: Negative for abdominal pain, N/V EXT: Negative for LE swelling. Positive for numbness of right lateral thigh.  Physical Exam:  Vitals:   06/19/17 1515  BP: (!) 141/70  Pulse: 97  SpO2: 97%  Weight: 182 lb 6.4 oz (82.7 kg)   GEN: Sitting comfortably in chair in NAD CV: NR & RR, no m/r/g PULM: CTAB, no wheezes or rales ABD: Soft, NT, ND, +BS EXT: No LE edema. No tenderness to palpation of right lateral thigh. Discoloration of half of toenail on right first toe, as well as some discoloration of second toe. No involvement of proximal nail fold noted.  Assessment & Plan:   See Encounters Tab for problem based charting.  Patient discussed with Dr. Lynnae January

## 2017-06-19 NOTE — Patient Instructions (Addendum)
FOLLOW-UP INSTRUCTIONS When: 4-6 weeks For: blood pressure and diabetes management What to bring: medications, glucometer   Ms. Rynders,  It was nice to meet you.  I sent a refill of your amlodipine to your pharmacy. We will have you start a new medicine today for your blood pressure. - Please start taking losartan 50mg  once daily. - If you start noticing lip swelling, please stop the medicine and call our office.  For your diabetes, please continue your insulin as you have been doing. It is important to make sure you eat enough food for dinner in order to prevent you from having low blood sugar in the morning. If you are not going to eat much, you can decrease your night time insulin to 10 units. We are also starting a new pill for your diabetes - Please start taking Janumet according to the instructions provided.  Your right leg numbness is most likely due to a condition that causes irritation of the nerve in your waist, leading to numbness and sharp pains. This occurs in people who are overweight and the best treatment is weight loss. The other thing that can help is to wear pants/shorts with loose fitting waist bands to take off the pressure from that area.  Your nail is most likely due to a fungus infection. It is best for right now to focus on getting your diabetes under control.  Please come back to our clinic in about 4-6 weeks to follow up on your blood pressure and diabetes.  If you have any questions or concerns, call our clinic at [phone] or after hours call [phone] and ask for the internal medicine resident on call.

## 2017-06-19 NOTE — Assessment & Plan Note (Signed)
Assessment BP 141/70 today. Endorses compliance with amlodipine 10mg  daily. She has a history of angioedema and rash with lisinopril, however given this was not a severe reaction, there is low likelihood for a similar side effect with ARB. I have advised her of this and that if she does experience recurrent symptoms, to stop the ARB immediately and call our clinic.  Plan - Refilled amlodipine 10mg  daily - Start losartan 50mg  daily --- If develops lip swelling or rash, advised patient to stop losartan and to call our clinic - RTC in 4-6 weeks for BMP and BP check

## 2017-06-19 NOTE — Assessment & Plan Note (Addendum)
Assessment Symptoms consistent with meralgia paresthetica. BMI 32. Advised on importance of focusing on better control of diabetes. Patient amenable.  Plan - Counseled on weight loss - Advised to wear loose-fitting clothing - Management of diabetes, as indicated under separate A&P

## 2017-06-19 NOTE — Assessment & Plan Note (Signed)
Assessment Exam consistent with onychomycosis of right first toenail. Patient is self-pay and given risk of hepatotoxicity with treatment, cost of treatment, and low risk for observation, will continue to monitor for now. If symptoms become intolerable or if develops involvement of proximal nail fold concerning for subungual melanoma, will initiate treatment or refer for further management. Counseled on focusing on better control of diabetes as initial first step. Patient agreeable.  Plan - Continue to monitor - Management of diabetes, as indicated under separate A&P

## 2017-06-19 NOTE — Assessment & Plan Note (Signed)
Assessment A1c today is 10.1. She does not have her glucometer with her today, however she notes 4-5 monthly episodes of hypoglycemia that occur in the morning after not eating enough the night before. She is taking Novolin 70/30 30 units qAM and 20 units qPM. She has been on metformin in the past, but this was discontinued several years ago due to patient's concern that it was causing a rash. Feel this is low risk and low likelihood, will attempt to restart metformin today to achieve better glycemic control. Patient amenable.   Plan - Continue Novolin 70/30 30 units qAM and 20 units qPM - Start Janumet XR today (sent to [facility] for $4) --- Janumet XR 50-500mg  daily x7d --- Then Janumet XR 50-500mg  BID x7d --- Then Janumet XR 100-1000mg  thereafter --- Advised patient to take with food - RTC in 4-6 weeks

## 2017-06-20 ENCOUNTER — Other Ambulatory Visit: Payer: Self-pay

## 2017-06-20 ENCOUNTER — Other Ambulatory Visit: Payer: Self-pay | Admitting: *Deleted

## 2017-06-20 DIAGNOSIS — I10 Essential (primary) hypertension: Secondary | ICD-10-CM

## 2017-06-20 DIAGNOSIS — IMO0001 Reserved for inherently not codable concepts without codable children: Secondary | ICD-10-CM

## 2017-06-20 DIAGNOSIS — E7849 Other hyperlipidemia: Secondary | ICD-10-CM

## 2017-06-20 DIAGNOSIS — E1165 Type 2 diabetes mellitus with hyperglycemia: Secondary | ICD-10-CM

## 2017-06-20 DIAGNOSIS — Z794 Long term (current) use of insulin: Secondary | ICD-10-CM

## 2017-06-20 MED ORDER — PRAVASTATIN SODIUM 40 MG PO TABS: 40.0000 mg | ORAL_TABLET | Freq: Every day | ORAL | 3 refills | Status: DC

## 2017-06-20 MED ORDER — LOSARTAN POTASSIUM 50 MG PO TABS: 50.0000 mg | ORAL_TABLET | Freq: Every day | ORAL | 3 refills | Status: DC

## 2017-06-20 MED ORDER — AMLODIPINE BESYLATE 10 MG PO TABS: 10.0000 mg | ORAL_TABLET | Freq: Every day | ORAL | 3 refills | Status: DC

## 2017-06-20 MED ORDER — INSULIN NPH (HUMAN) (ISOPHANE) 100 UNIT/ML ~~LOC~~ SUSP: SUBCUTANEOUS | 11 refills | Status: DC

## 2017-06-20 NOTE — Progress Notes (Signed)
Internal Medicine Clinic Attending  Case discussed with Dr. Huang at the time of the visit.  We reviewed the resident's history and exam and pertinent patient test results.  I agree with the assessment, diagnosis, and plan of care documented in the resident's note. 

## 2017-06-20 NOTE — Telephone Encounter (Signed)
All meds needs to be filled @ outpatient pharmacy. Please call pt back.

## 2017-06-20 NOTE — Telephone Encounter (Signed)
addressed

## 2017-06-21 ENCOUNTER — Other Ambulatory Visit: Payer: Self-pay | Admitting: Pharmacist

## 2017-06-21 DIAGNOSIS — IMO0001 Reserved for inherently not codable concepts without codable children: Secondary | ICD-10-CM

## 2017-06-21 DIAGNOSIS — Z794 Long term (current) use of insulin: Secondary | ICD-10-CM

## 2017-06-21 DIAGNOSIS — E1165 Type 2 diabetes mellitus with hyperglycemia: Secondary | ICD-10-CM

## 2017-06-21 DIAGNOSIS — E7849 Other hyperlipidemia: Secondary | ICD-10-CM

## 2017-06-21 MED ORDER — PRAVASTATIN SODIUM 40 MG PO TABS: 40.0000 mg | ORAL_TABLET | Freq: Every day | ORAL | 3 refills | Status: DC

## 2017-06-21 MED ORDER — INSULIN NPH (HUMAN) (ISOPHANE) 100 UNIT/ML ~~LOC~~ SUSP: SUBCUTANEOUS | 11 refills | Status: DC

## 2017-06-21 MED ORDER — SITAGLIP PHOS-METFORMIN HCL ER 50-1000 MG PO TB24: 2.0000 | ORAL_TABLET | Freq: Every day | ORAL | 3 refills | Status: DC

## 2017-06-21 MED ORDER — "INSULIN SYRINGE 31G X 5/16"" 0.3 ML MISC": 1.0000 | Freq: Two times a day (BID) | 0 refills | Status: DC

## 2017-06-21 MED ORDER — SITAGLIP PHOS-METFORMIN HCL ER 50-1000 MG PO TB24: 2.0000 | ORAL_TABLET | Freq: Two times a day (BID) | ORAL | 3 refills | Status: DC

## 2017-06-21 MED FILL — JANUMET XR 50-1,000 MG TAB: 50-1000 | 30 days supply | Qty: 60 | Fill #0

## 2017-06-21 MED FILL — LOSARTAN POTASSIUM 50 MG TA: 50 | 30 days supply | Qty: 30 | Fill #0

## 2017-06-21 MED FILL — AMLODIPINE BESYLATE 10 MG T: 10 | 30 days supply | Qty: 30 | Fill #0

## 2017-06-21 MED FILL — NovoLIN N 100 UNIT/ML SUSP: 100 | 20 days supply | Qty: 10 | Fill #0

## 2017-06-21 MED FILL — PRAVASTATIN NA 40 MG TAB: 40 | 30 days supply | Qty: 30 | Fill #0

## 2017-06-21 MED FILL — ULTICARE SYR 0.3 ML 30GX5/1: 30G X 5/16" | 30 days supply | Qty: 60 | Fill #0

## 2017-06-21 NOTE — Addendum Note (Signed)
Addended by: Forde Dandy on: 06/21/2017 10:40 AM   Modules accepted: Orders

## 2017-06-21 NOTE — Progress Notes (Signed)
Pharmacy does not have the 50-500 mg strength of Janumet available under [facility]B, so changed prescription based on formulary

## 2017-06-21 NOTE — Progress Notes (Signed)
Re-sent prescriptions under IM program

## 2017-06-21 NOTE — Addendum Note (Signed)
Addended by: Forde Dandy on: 06/21/2017 10:25 AM   Modules accepted: Orders

## 2017-06-26 ENCOUNTER — Telehealth: Payer: Self-pay | Admitting: Pharmacist

## 2017-06-26 ENCOUNTER — Other Ambulatory Visit: Payer: Self-pay | Admitting: *Deleted

## 2017-06-26 DIAGNOSIS — E1165 Type 2 diabetes mellitus with hyperglycemia: Principal | ICD-10-CM

## 2017-06-26 DIAGNOSIS — IMO0001 Reserved for inherently not codable concepts without codable children: Secondary | ICD-10-CM

## 2017-06-26 DIAGNOSIS — Z794 Long term (current) use of insulin: Principal | ICD-10-CM

## 2017-06-26 MED ORDER — GLUCOSE BLOOD VI STRP: ORAL_STRIP | 11 refills | Status: DC

## 2017-06-26 MED ORDER — CONTOUR MONITOR W/DEVICE KIT: 1.0000 | PACK | Freq: Once | 0 refills | Status: AC

## 2017-06-26 MED ORDER — SITAGLIPTIN PHOSPHATE 100 MG PO TABS: 100.0000 mg | ORAL_TABLET | Freq: Every day | ORAL | 11 refills | Status: DC

## 2017-06-26 MED ORDER — MICROLET LANCETS MISC: 1.0000 | Freq: Three times a day (TID) | 11 refills | Status: DC

## 2017-06-26 MED ORDER — METFORMIN HCL ER 500 MG PO TB24: 500.0000 mg | ORAL_TABLET | Freq: Every day | ORAL | 11 refills | Status: DC

## 2017-06-26 MED FILL — CONTOUR NEXT METER: W/DEVICE | 30 days supply | Qty: 1 | Fill #0

## 2017-06-26 MED FILL — CONTOUR NEXT STRIPS: 30 days supply | Qty: 100 | Fill #0

## 2017-06-26 MED FILL — MICROLET LANCETS: 30 days supply | Qty: 100 | Fill #0

## 2017-06-26 NOTE — Progress Notes (Signed)
Contacted patient on behalf of PCP to help with DM Current regimen includes NPH insulin, 30 units in the morning and 20 units in the evening; metformin 2000 mg daily, and sitagliptin 100 mg daily  She reports checking BG once daily first thing in the morning, with results around 90s-100s. Advised patient to check BG prior to each meal if possible, TID. Sent Rx in for new BG meter and more affordable test strips.  Patient states she is unable to tolerate metformin 2000 mg daily so she takes 1 tablet every other day. Discussed with patient the option of decreasing the dose to 500 mg daily and patient reports interest in trying this approach. Will also try to enroll patient into [facility] pharmacy while awaiting Medicare enrollment. Patient was advised to contact clinic if questions/concerns arise. Patient verbalized understanding.

## 2017-07-25 MED FILL — PRAVASTATIN NA 40 MG TAB: 40 | 30 days supply | Qty: 30 | Fill #1

## 2017-07-25 MED FILL — LOSARTAN POTASSIUM 50 MG TA: 50 | 30 days supply | Qty: 30 | Fill #1

## 2017-07-25 MED FILL — NovoLIN N 100 UNIT/ML SUSP: 100 | 20 days supply | Qty: 10 | Fill #1

## 2017-07-25 MED FILL — AMLODIPINE BESYLATE 10 MG T: 10 | 30 days supply | Qty: 30 | Fill #1

## 2017-09-04 MED FILL — PRAVASTATIN NA 40 MG TAB: 40 | 30 days supply | Qty: 30 | Fill #2

## 2017-09-04 MED FILL — AMLODIPINE BESYLATE 10 MG T: 10 | 30 days supply | Qty: 30 | Fill #2

## 2017-09-04 MED FILL — LOSARTAN POTASSIUM 50 MG TA: 50 | 30 days supply | Qty: 30 | Fill #2

## 2017-09-05 MED FILL — NovoLIN N 100 UNIT/ML SUSP: 100 | 20 days supply | Qty: 10 | Fill #2

## 2017-10-09 MED FILL — NovoLIN N 100 UNIT/ML SUSP: 100 | 20 days supply | Qty: 10 | Fill #3

## 2017-10-18 MED FILL — PRAVASTATIN NA 40 MG TAB: 40 | 30 days supply | Qty: 30 | Fill #3

## 2017-10-18 MED FILL — AMLODIPINE BESYLATE 10 MG T: 10 | 30 days supply | Qty: 30 | Fill #3

## 2017-10-18 MED FILL — LOSARTAN POTASSIUM 50 MG TA: 50 | 30 days supply | Qty: 30 | Fill #3

## 2017-10-27 MED FILL — NovoLIN N 100 UNIT/ML SUSP: 100 | 20 days supply | Qty: 10 | Fill #4

## 2017-11-14 ENCOUNTER — Encounter: Payer: Self-pay | Admitting: Internal Medicine

## 2017-11-24 MED FILL — LOSARTAN POTASSIUM 50 MG TA: 50 | 30 days supply | Qty: 30 | Fill #4

## 2017-11-24 MED FILL — NovoLIN N 100 UNIT/ML SUSP: 100 | 20 days supply | Qty: 10 | Fill #5

## 2017-11-24 MED FILL — PRAVASTATIN NA 40 MG TAB: 40 | 30 days supply | Qty: 30 | Fill #4

## 2017-11-24 MED FILL — AMLODIPINE BESYLATE 10 MG T: 10 | 30 days supply | Qty: 30 | Fill #4

## 2017-11-25 ENCOUNTER — Emergency Department (HOSPITAL_COMMUNITY)
Admission: EM | Admit: 2017-11-25 | Discharge: 2017-11-25 | Disposition: A | Discharge status: Home / Self Care | Payer: Self-pay | Attending: Emergency Medicine | Admitting: Emergency Medicine

## 2017-11-25 ENCOUNTER — Encounter (HOSPITAL_COMMUNITY): Payer: Self-pay | Admitting: *Deleted

## 2017-11-25 ENCOUNTER — Emergency Department (HOSPITAL_COMMUNITY): Payer: Self-pay

## 2017-11-25 DIAGNOSIS — T148XXA Other injury of unspecified body region, initial encounter: Secondary | ICD-10-CM

## 2017-11-25 DIAGNOSIS — I129 Hypertensive chronic kidney disease with stage 1 through stage 4 chronic kidney disease, or unspecified chronic kidney disease: Secondary | ICD-10-CM | POA: Insufficient documentation

## 2017-11-25 DIAGNOSIS — Z8614 Personal history of Methicillin resistant Staphylococcus aureus infection: Secondary | ICD-10-CM | POA: Insufficient documentation

## 2017-11-25 DIAGNOSIS — E1122 Type 2 diabetes mellitus with diabetic chronic kidney disease: Secondary | ICD-10-CM | POA: Insufficient documentation

## 2017-11-25 DIAGNOSIS — Y92009 Unspecified place in unspecified non-institutional (private) residence as the place of occurrence of the external cause: Secondary | ICD-10-CM | POA: Insufficient documentation

## 2017-11-25 DIAGNOSIS — Z794 Long term (current) use of insulin: Secondary | ICD-10-CM | POA: Insufficient documentation

## 2017-11-25 DIAGNOSIS — Y9301 Activity, walking, marching and hiking: Secondary | ICD-10-CM | POA: Insufficient documentation

## 2017-11-25 DIAGNOSIS — N183 Chronic kidney disease, stage 3 (moderate): Secondary | ICD-10-CM | POA: Insufficient documentation

## 2017-11-25 DIAGNOSIS — Z23 Encounter for immunization: Secondary | ICD-10-CM | POA: Insufficient documentation

## 2017-11-25 DIAGNOSIS — Y999 Unspecified external cause status: Secondary | ICD-10-CM | POA: Insufficient documentation

## 2017-11-25 DIAGNOSIS — L089 Local infection of the skin and subcutaneous tissue, unspecified: Secondary | ICD-10-CM | POA: Insufficient documentation

## 2017-11-25 DIAGNOSIS — W2209XA Striking against other stationary object, initial encounter: Secondary | ICD-10-CM | POA: Insufficient documentation

## 2017-11-25 DIAGNOSIS — S90811A Abrasion, right foot, initial encounter: Secondary | ICD-10-CM | POA: Insufficient documentation

## 2017-11-25 LAB — CBG MONITORING, ED: Glucose-Capillary: 136 mg/dL — ABNORMAL HIGH (ref 70–99)

## 2017-11-25 MED ORDER — TETANUS-DIPHTH-ACELL PERTUSSIS 5-2.5-18.5 LF-MCG/0.5 IM SUSP
0.5000 mL | Freq: Once | INTRAMUSCULAR | Status: AC
  Administered 2017-11-25: 0.5 mL via INTRAMUSCULAR
  Filled 2017-11-25: qty 0.5

## 2017-11-25 MED ORDER — CEPHALEXIN 500 MG PO CAPS: 500.0000 mg | ORAL_CAPSULE | Freq: Four times a day (QID) | ORAL | 0 refills | Status: DC

## 2017-11-25 NOTE — ED Triage Notes (Signed)
Pt complains of pain in right foot since stepping on an ink pen 4 days ago. Pt states the pen broke the skin. Pt has hx of diabetes.

## 2017-11-25 NOTE — Discharge Instructions (Addendum)
You were seen in the ER today for an infected wound. The xray did not show fractures or dislocations. Your blood sugar was 136. Your tetanus was updated. We are starting you on keflex to help with this infection. Please take all of your antibiotics until finished. You may develop abdominal discomfort or diarrhea from the antibiotic.  You may help offset this with probiotics which you can buy at the store (ask your pharmacist if unable to find) or get probiotics in the form of eating yogurt. Do not eat or take the probiotics until 2 hours after your antibiotic. If you are unable to tolerate these side effects follow-up with your primary care provider or return to the emergency department.   If you begin to experience any blistering, rashes, swelling, or difficulty breathing seek medical care for evaluation of potentially more serious side effects.   Please be aware that this medication may interact with other medications you are taking, please be sure to discuss your medication list with your pharmacist.   Follow up with your primary care provider for a wound recheck in 2-3 days. Return to the ER sooner should you experience fevers, spreading redness, drainage from the area, vomiting, or any other concerns.   Your blood pressure should also be rechecked at this appointment as it was elevated in the ER today.  Vitals:   11/25/17 0924  BP: (!) 155/82  Pulse: 89  Resp: 18  Temp: 98.7 F (37.1 C)  SpO2: 98%

## 2017-11-25 NOTE — ED Notes (Signed)
Patient transported to X-ray 

## 2017-11-25 NOTE — ED Provider Notes (Signed)
[facility] DEPT Provider Note   CSN: [id number] Arrival date & time: 11/25/17  0175     History   Chief Complaint Chief Complaint  Patient presents with  . Foot Pain    HPI Mandy Mullen is a 67 y.o. female with a hx of T2DM, HTN, hyperlipidemia, and prior MRSA who presents to the ED with complaints of right foot pain s/p injury 4 days prior. Patient states she was walking barefoot when she slid onto a pen point that was lying on the ground. She states there was a small break in the skin. The area has been painful, 8/10 in severity, constant, worse with weight-bearing. She has noted surrounding redness/warmth. No purulent drainage. Taking ibuprofen at home with some relief. Denies fever, chills, nausea, vomiting, numbness, or weakness. CBGs at home in the 200s. Unknown last tetanus.   HPI  Past Medical History:  Diagnosis Date  . Diabetes mellitus   . History of herpes genitalis   . Hyperlipidemia   . Hypertension   . MRSA (methicillin resistant Staphylococcus aureus)    Reccurent MRSA abscesses    Patient Active Problem List   Diagnosis Date Noted  . Meralgia paraesthetica, right 06/19/2017  . Fungal infection of toenail 06/19/2017  . CKD stage 3 due to type 2 diabetes mellitus ([place]) 07/29/2016  . Healthcare maintenance 02/26/2014  . Essential hypertension 12/17/2007  . Hyperlipidemia 01/13/2006  . Uncontrolled type 2 diabetes mellitus ([place]) 12/20/2005  . TOTAL ABDOMINAL HYSTERECTOMY, HX OF 12/20/2005    Past Surgical History:  Procedure Laterality Date  . ABDOMINAL HYSTERECTOMY  1991  . gluteal abscess  4/06   S/P I and D     OB History   None      Home Medications    Prior to Admission medications   Medication Sig Start Date End Date Taking? Authorizing Provider  amLODipine (NORVASC) 10 MG tablet Take 1 tablet (10 mg total) by mouth daily. IM PROGRAM 06/20/17   Ledell Noss, MD  glucose blood (CONTOUR NEXT TEST) test strip  Test blood sugar 3 times daily. IM program 06/26/17   Ledell Noss, MD  glucose blood test strip Use as instructed 08/14/12   Wilber Oliphant, MD  insulin NPH Human (NOVOLIN N) 100 UNIT/ML injection Take 30 units 53minutes before breakfast and 20 units 30 minutes before dinner. HOLD if not eating. 06/21/17   Ledell Noss, MD  Insulin Syringe-Needle U-100 (INSULIN SYRINGE .3CC/31GX5/16") 31G X 5/16" 0.3 ML MISC 1 Dose by Does not apply route 2 (two) times daily. IM program 06/21/17   Ledell Noss, MD  losartan (COZAAR) 50 MG tablet Take 1 tablet (50 mg total) by mouth daily. IM PROGRAM 06/20/17 09/18/17  Ledell Noss, MD  metFORMIN (GLUCOPHAGE XR) 500 MG 24 hr tablet Take 1 tablet (500 mg total) by mouth daily with breakfast. IM program 07/26/17   Ledell Noss, MD  MICROLET LANCETS MISC 1 Device by Does not apply route 3 (three) times daily. 06/26/17   Ledell Noss, MD  Multiple Vitamin (MULTIVITAMIN) tablet Take 1 tablet by mouth daily.    [provider]  ONE TOUCH LANCETS MISC Use to check blood sugars 08/14/12   Wilber Oliphant, MD  pravastatin (PRAVACHOL) 40 MG tablet Take 1 tablet (40 mg total) by mouth daily. 06/21/17   Ledell Noss, MD  sitaGLIPtin (JANUVIA) 100 MG tablet Take 1 tablet (100 mg total) by mouth daily. IM program 06/26/17   Ledell Noss, MD  loratadine Shearon Balo)  10 MG tablet Take 1 tablet (10 mg total) by mouth daily. Patient not taking: Reported on 02/26/2014 07/03/13 05/26/14  Wilber Oliphant, MD    Family History Family History  Problem Relation Age of Onset  . Coronary artery disease Father   . Diabetes Sister   . Stroke Sister     Social History Social History   Tobacco Use  . Smoking status: Never Smoker  Substance Use Topics  . Alcohol use: No    Alcohol/week: 0.0 standard drinks  . Drug use: No     Allergies   Lisinopril and Eggs or egg-derived products   Review of Systems Review of Systems  Constitutional: Negative for chills and fever.  Musculoskeletal:        Positive for foot pain  Skin: Positive for color change and wound.  Neurological: Negative for weakness and numbness.  All other systems reviewed and are negative.    Physical Exam Updated Vital Signs BP (!) 155/82 (BP Location: Left Arm)   Pulse 89   Temp 98.7 F (37.1 C) (Oral)   Resp 18   SpO2 98%   Physical Exam  Constitutional: She appears well-developed and well-nourished. No distress.  HENT:  Head: Normocephalic and atraumatic.  Eyes: Conjunctivae are normal. Right eye exhibits no discharge. Left eye exhibits no discharge.  Cardiovascular: Normal rate and regular rhythm.  Pulses:      Dorsalis pedis pulses are 2+ on the right side, and 2+ on the left side.       Posterior tibial pulses are 2+ on the right side, and 2+ on the left side.  Pulmonary/Chest: Effort normal. No respiratory distress.  Musculoskeletal:  Lower extremities: R foot: There is a small abrasion to the lateral plantar aspect of the forefoot with surrounding erythema/warmth with mild swelling, this does not extend to the dorsum of the foot, heel, or the digits, fairly well localized to forefoot and mid foot. No obvious retained FB. This area is tender to palpation. No palpable fluctuance/induration. NVI distally.   Neurological: She is alert.  Clear speech. Sensation grossly intact to bilateral lower extremities. 5/5 strength with plantar/dorsiflexion bilaterally.   Psychiatric: She has a normal mood and affect. Her behavior is normal. Thought content normal.  Nursing note and vitals reviewed.      ED Treatments / Results  Labs (all labs ordered are listed, but only abnormal results are displayed) Labs Reviewed  CBG MONITORING, ED - Abnormal; Notable for the following components:      Result Value   Glucose-Capillary 136 (*)    All other components within normal limits    EKG None  Radiology Dg Foot Complete Right  Result Date: 11/25/2017 CLINICAL DATA:  Pt complains of pain in right foot  since stepping on an ink pen 4 days ago. Pt states the pen broke the skin. Pt reported pain in her toes, pain in the bottom of her foot, and warm to the touch. Pt reported hx of diabetes. EXAM: RIGHT FOOT COMPLETE - 3+ VIEW COMPARISON:  None. FINDINGS: No fracture. No bone lesion. There are no areas of bone resorption to suggest osteomyelitis. Joints are normally aligned. Mild first metatarsophalangeal joint osteoarthritis. No other arthropathic changes. Minimal plantar calcaneal spur. Mild forefoot soft tissue edema.  No soft tissue air. IMPRESSION: 1. No fracture, dislocation, bone lesion or evidence of osteomyelitis. Electronically Signed   By: Lajean Manes M.D.   On: 11/25/2017 10:45    Procedures Procedures (including critical care time)  Medications Ordered in ED Medications  Tdap (BOOSTRIX) injection 0.5 mL (0.5 mLs Intramuscular Given 11/25/17 1054)     Initial Impression / Assessment and Plan / ED Course  I have reviewed the triage vital signs and the nursing notes.  Pertinent labs & imaging results that were available during my care of the patient were reviewed by me and considered in my medical decision making (see chart for details).   Patient with hx of T2DM presents with injury and resultant wound to the R foot. Patient nontoxic appearing, in no apparent distress, vitals WNL other than elevated BP which requires PCP recheck. Exam consistent with hx, no streaking of the erythema, no palpable fluctuance to raise concern for abscess. No visible retained FB or radiopaque FB on xray. No evidence of fracture/dislocation or osteomyelitis on xray. CBG 136. Wound appears infected, patient does not appear septic, NVI distally. Injury was not through a shoe. Tetanus updated today. Will place on keflex with close PCP follow up for recheck. I discussed results, treatment plan, need for PCP follow-up, and return precautions with the patient. Provided opportunity for questions, patient confirmed  understanding and is in agreement with plan.  Findings and plan of care discussed with supervising physician Dr. Laverta Baltimore who is in agreement.  Final Clinical Impressions(s) / ED Diagnoses   Final diagnoses:  Wound infection    ED Discharge Orders         Ordered    cephALEXin (KEFLEX) 500 MG capsule  4 times daily     11/25/17 [address] [place], PA-C 11/25/17 1132    Margette Fast, MD 11/26/17 [phone]

## 2017-11-28 ENCOUNTER — Encounter (HOSPITAL_COMMUNITY): Payer: Self-pay

## 2017-11-28 ENCOUNTER — Emergency Department (HOSPITAL_COMMUNITY): Payer: Medicare Other

## 2017-11-28 ENCOUNTER — Inpatient Hospital Stay (HOSPITAL_COMMUNITY)
Admission: EM | Admit: 2017-11-28 | Discharge: 2017-11-30 | DRG: 638 | Disposition: A | Discharge status: Home / Self Care | Payer: Medicare Other | Attending: Internal Medicine | Admitting: Internal Medicine

## 2017-11-28 ENCOUNTER — Other Ambulatory Visit: Payer: Self-pay

## 2017-11-28 DIAGNOSIS — E1165 Type 2 diabetes mellitus with hyperglycemia: Secondary | ICD-10-CM | POA: Diagnosis present

## 2017-11-28 DIAGNOSIS — E11621 Type 2 diabetes mellitus with foot ulcer: Secondary | ICD-10-CM | POA: Diagnosis present

## 2017-11-28 DIAGNOSIS — L089 Local infection of the skin and subcutaneous tissue, unspecified: Secondary | ICD-10-CM | POA: Diagnosis present

## 2017-11-28 DIAGNOSIS — Z833 Family history of diabetes mellitus: Secondary | ICD-10-CM

## 2017-11-28 DIAGNOSIS — L02611 Cutaneous abscess of right foot: Secondary | ICD-10-CM | POA: Diagnosis present

## 2017-11-28 DIAGNOSIS — S91331A Puncture wound without foreign body, right foot, initial encounter: Secondary | ICD-10-CM

## 2017-11-28 DIAGNOSIS — E11628 Type 2 diabetes mellitus with other skin complications: Principal | ICD-10-CM | POA: Diagnosis present

## 2017-11-28 DIAGNOSIS — Z90710 Acquired absence of both cervix and uterus: Secondary | ICD-10-CM

## 2017-11-28 DIAGNOSIS — Z791 Long term (current) use of non-steroidal anti-inflammatories (NSAID): Secondary | ICD-10-CM

## 2017-11-28 DIAGNOSIS — Z888 Allergy status to other drugs, medicaments and biological substances status: Secondary | ICD-10-CM

## 2017-11-28 DIAGNOSIS — Z8249 Family history of ischemic heart disease and other diseases of the circulatory system: Secondary | ICD-10-CM

## 2017-11-28 DIAGNOSIS — L97519 Non-pressure chronic ulcer of other part of right foot with unspecified severity: Secondary | ICD-10-CM | POA: Diagnosis present

## 2017-11-28 DIAGNOSIS — B9561 Methicillin susceptible Staphylococcus aureus infection as the cause of diseases classified elsewhere: Secondary | ICD-10-CM | POA: Diagnosis present

## 2017-11-28 DIAGNOSIS — Z23 Encounter for immunization: Secondary | ICD-10-CM

## 2017-11-28 DIAGNOSIS — E1159 Type 2 diabetes mellitus with other circulatory complications: Secondary | ICD-10-CM | POA: Diagnosis present

## 2017-11-28 DIAGNOSIS — E118 Type 2 diabetes mellitus with unspecified complications: Secondary | ICD-10-CM

## 2017-11-28 DIAGNOSIS — IMO0002 Reserved for concepts with insufficient information to code with codable children: Secondary | ICD-10-CM | POA: Diagnosis present

## 2017-11-28 DIAGNOSIS — Z823 Family history of stroke: Secondary | ICD-10-CM

## 2017-11-28 DIAGNOSIS — N183 Chronic kidney disease, stage 3 (moderate): Secondary | ICD-10-CM | POA: Diagnosis present

## 2017-11-28 DIAGNOSIS — Z91013 Allergy to seafood: Secondary | ICD-10-CM

## 2017-11-28 DIAGNOSIS — N182 Chronic kidney disease, stage 2 (mild): Secondary | ICD-10-CM

## 2017-11-28 DIAGNOSIS — Z794 Long term (current) use of insulin: Secondary | ICD-10-CM

## 2017-11-28 DIAGNOSIS — Z79899 Other long term (current) drug therapy: Secondary | ICD-10-CM

## 2017-11-28 DIAGNOSIS — I129 Hypertensive chronic kidney disease with stage 1 through stage 4 chronic kidney disease, or unspecified chronic kidney disease: Secondary | ICD-10-CM | POA: Diagnosis present

## 2017-11-28 DIAGNOSIS — E1122 Type 2 diabetes mellitus with diabetic chronic kidney disease: Secondary | ICD-10-CM | POA: Diagnosis present

## 2017-11-28 DIAGNOSIS — Z91012 Allergy to eggs: Secondary | ICD-10-CM

## 2017-11-28 DIAGNOSIS — Z8614 Personal history of Methicillin resistant Staphylococcus aureus infection: Secondary | ICD-10-CM

## 2017-11-28 DIAGNOSIS — I10 Essential (primary) hypertension: Secondary | ICD-10-CM

## 2017-11-28 DIAGNOSIS — Z1629 Resistance to other single specified antibiotic: Secondary | ICD-10-CM | POA: Diagnosis present

## 2017-11-28 DIAGNOSIS — E785 Hyperlipidemia, unspecified: Secondary | ICD-10-CM | POA: Diagnosis present

## 2017-11-28 DIAGNOSIS — W228XXA Striking against or struck by other objects, initial encounter: Secondary | ICD-10-CM

## 2017-11-28 DIAGNOSIS — E119 Type 2 diabetes mellitus without complications: Secondary | ICD-10-CM | POA: Diagnosis present

## 2017-11-28 DIAGNOSIS — I152 Hypertension secondary to endocrine disorders: Secondary | ICD-10-CM | POA: Diagnosis present

## 2017-11-28 HISTORY — DX: Personal history of other medical treatment: Z92.89

## 2017-11-28 HISTORY — DX: Type 2 diabetes mellitus without complications: E11.9

## 2017-11-28 LAB — BASIC METABOLIC PANEL
Anion gap: 7 (ref 5–15)
BUN: 12 mg/dL (ref 8–23)
CHLORIDE: 100 mmol/L (ref 98–111)
CO2: 29 mmol/L (ref 22–32)
CREATININE: 1 mg/dL (ref 0.44–1.00)
Calcium: 9.4 mg/dL (ref 8.9–10.3)
GFR calc Af Amer: >60 mL/min (ref >60)
GFR calc non Af Amer: 57 mL/min — ABNORMAL LOW (ref >60)
Glucose, Bld: 283 mg/dL — ABNORMAL HIGH (ref 70–99)
POTASSIUM: 4 mmol/L (ref 3.5–5.1)
Sodium: 136 mmol/L (ref 135–145)

## 2017-11-28 LAB — GLUCOSE, CAPILLARY
Glucose-Capillary: 221 mg/dL — ABNORMAL HIGH (ref 70–99)
Glucose-Capillary: 172 mg/dL — ABNORMAL HIGH (ref 70–99)

## 2017-11-28 LAB — CBC WITH DIFFERENTIAL/PLATELET
ABS IMMATURE GRANULOCYTES: 0.1 10*3/uL — AB (ref 0.00–0.07)
BASOS ABS: 0.1 10*3/uL (ref 0.0–0.1)
Basophils Relative: 1 %
EOS PCT: 1 %
Eosinophils Absolute: 0.2 10*3/uL (ref 0.0–0.5)
HEMATOCRIT: 37.1 % (ref 36.0–46.0)
HEMOGLOBIN: 11.8 g/dL — AB (ref 12.0–15.0)
IMMATURE GRANULOCYTES: 1 %
LYMPHS ABS: 2 10*3/uL (ref 0.7–4.0)
LYMPHS PCT: 12 %
MCH: 28.4 pg (ref 26.0–34.0)
MCHC: 31.8 g/dL (ref 30.0–36.0)
MCV: 89.4 fL (ref 80.0–100.0)
Monocytes Absolute: 1.3 10*3/uL — ABNORMAL HIGH (ref 0.1–1.0)
Monocytes Relative: 7 %
NEUTROS ABS: 13.7 10*3/uL — AB (ref 1.7–7.7)
NEUTROS PCT: 78 %
Platelets: 396 10*3/uL (ref 150–400)
RBC: 4.15 MIL/uL (ref 3.87–5.11)
RDW: 12.9 % (ref 11.5–15.5)
WBC: 17.4 10*3/uL — AB (ref 4.0–10.5)
nRBC: 0 % (ref 0.0–0.2)

## 2017-11-28 MED ORDER — AMLODIPINE BESYLATE 10 MG PO TABS
10.0000 mg | ORAL_TABLET | Freq: Every day | ORAL | Status: DC
  Administered 2017-11-28 – 2017-11-30 (×3): 10 mg via ORAL
  Filled 2017-11-28 (×3): qty 1

## 2017-11-28 MED ORDER — ENOXAPARIN SODIUM 40 MG/0.4ML ~~LOC~~ SOLN
40.0000 mg | SUBCUTANEOUS | Status: DC
  Administered 2017-11-28 – 2017-11-29 (×2): 40 mg via SUBCUTANEOUS
  Filled 2017-11-28 (×2): qty 0.4

## 2017-11-28 MED ORDER — VANCOMYCIN HCL 10 G IV SOLR
1250.0000 mg | INTRAVENOUS | Status: DC
  Administered 2017-11-28 – 2017-11-30 (×3): 1250 mg via INTRAVENOUS
  Filled 2017-11-28 (×4): qty 1250

## 2017-11-28 MED ORDER — LOSARTAN POTASSIUM 50 MG PO TABS
50.0000 mg | ORAL_TABLET | Freq: Every day | ORAL | Status: DC
  Administered 2017-11-29 – 2017-11-30 (×2): 50 mg via ORAL
  Filled 2017-11-28 (×3): qty 1

## 2017-11-28 MED ORDER — SODIUM CHLORIDE 0.9 % IV SOLN
INTRAVENOUS | Status: DC
  Administered 2017-11-28: 19:00:00 via INTRAVENOUS

## 2017-11-28 MED ORDER — ACETAMINOPHEN 325 MG PO TABS: 650.0000 mg | ORAL_TABLET | Freq: Four times a day (QID) | ORAL | Status: DC | PRN

## 2017-11-28 MED ORDER — INSULIN GLARGINE 100 UNIT/ML ~~LOC~~ SOLN
25.0000 [IU] | Freq: Every day | SUBCUTANEOUS | Status: DC
  Administered 2017-11-28: 25 [IU] via SUBCUTANEOUS
  Filled 2017-11-28 (×2): qty 0.25

## 2017-11-28 MED ORDER — PNEUMOCOCCAL VAC POLYVALENT 25 MCG/0.5ML IJ INJ
0.5000 mL | INJECTION | INTRAMUSCULAR | Status: AC
  Administered 2017-11-30: 0.5 mL via INTRAMUSCULAR
  Filled 2017-11-28: qty 0.5

## 2017-11-28 MED ORDER — ONDANSETRON HCL 4 MG PO TABS: 4.0000 mg | ORAL_TABLET | Freq: Four times a day (QID) | ORAL | Status: DC | PRN

## 2017-11-28 MED ORDER — INSULIN ASPART 100 UNIT/ML ~~LOC~~ SOLN: 0.0000 [IU] | Freq: Every day | SUBCUTANEOUS | Status: DC

## 2017-11-28 MED ORDER — INSULIN ASPART 100 UNIT/ML ~~LOC~~ SOLN
0.0000 [IU] | Freq: Three times a day (TID) | SUBCUTANEOUS | Status: DC
  Administered 2017-11-28 – 2017-11-29 (×2): 7 [IU] via SUBCUTANEOUS
  Filled 2017-11-28 (×2): qty 1

## 2017-11-28 MED ORDER — PRAVASTATIN SODIUM 40 MG PO TABS
40.0000 mg | ORAL_TABLET | Freq: Every day | ORAL | Status: DC
  Administered 2017-11-28 – 2017-11-30 (×3): 40 mg via ORAL
  Filled 2017-11-28 (×3): qty 1

## 2017-11-28 MED ORDER — ACETAMINOPHEN 650 MG RE SUPP: 650.0000 mg | Freq: Four times a day (QID) | RECTAL | Status: DC | PRN

## 2017-11-28 MED ORDER — OXYCODONE-ACETAMINOPHEN 5-325 MG PO TABS
1.0000 | ORAL_TABLET | Freq: Four times a day (QID) | ORAL | Status: DC | PRN
  Administered 2017-11-28 – 2017-11-30 (×7): 1 via ORAL
  Filled 2017-11-28 (×7): qty 1

## 2017-11-28 MED ORDER — ONDANSETRON HCL 4 MG/2ML IJ SOLN: 4.0000 mg | Freq: Four times a day (QID) | INTRAMUSCULAR | Status: DC | PRN

## 2017-11-28 NOTE — ED Triage Notes (Signed)
Pt reports right foot pain after stepping on an ink pen last Thursday. Pt was last seen here on 11/9 and sent home on antibiotics. Pt states that she has continued to have pain in her right foot since then.

## 2017-11-28 NOTE — ED Provider Notes (Signed)
[facility] EMERGENCY DEPARTMENT Provider Note   CSN: [id number] Arrival date & time: 11/28/17  1313     History   Chief Complaint Chief Complaint  Patient presents with  . Foot Pain    HPI Mandy Mullen is a 67 y.o. female.  67 year old female with history of insulin-dependent diabetes presents with worsening right foot pain.  Patient was seen in the emergency room 3 days ago after she had stepped on the tip of a ballpoint pen.  X-ray of the foot at that time was negative for foreign body, patient was started on Keflex for question of infection of the foot.  Patient returns today and states she has been taking antibiotics, her foot is hurting more.  She does not monitor her blood sugar, denies fevers or chills, does not see podiatry.  No other complaints or concerns.     Past Medical History:  Diagnosis Date  . Diabetes mellitus   . History of herpes genitalis   . Hyperlipidemia   . Hypertension   . MRSA (methicillin resistant Staphylococcus aureus)    Reccurent MRSA abscesses    Patient Active Problem List   Diagnosis Date Noted  . Meralgia paraesthetica, right 06/19/2017  . Fungal infection of toenail 06/19/2017  . CKD stage 3 due to type 2 diabetes mellitus ([place]) 07/29/2016  . Healthcare maintenance 02/26/2014  . Essential hypertension 12/17/2007  . Hyperlipidemia 01/13/2006  . Uncontrolled type 2 diabetes mellitus ([place]) 12/20/2005  . TOTAL ABDOMINAL HYSTERECTOMY, HX OF 12/20/2005    Past Surgical History:  Procedure Laterality Date  . ABDOMINAL HYSTERECTOMY  1991  . gluteal abscess  4/06   S/P I and D     OB History   None      Home Medications    Prior to Admission medications   Medication Sig Start Date End Date Taking? Authorizing Provider  amLODipine (NORVASC) 10 MG tablet Take 1 tablet (10 mg total) by mouth daily. IM PROGRAM 06/20/17   Ledell Noss, MD  cephALEXin (KEFLEX) 500 MG capsule Take 1 capsule (500 mg total) by  mouth 4 (four) times daily. 11/25/17   Petrucelli, Samantha R, PA-C  glucose blood (CONTOUR NEXT TEST) test strip Test blood sugar 3 times daily. IM program 06/26/17   Ledell Noss, MD  glucose blood test strip Use as instructed 08/14/12   Wilber Oliphant, MD  insulin NPH Human (NOVOLIN N) 100 UNIT/ML injection Take 30 units 40minutes before breakfast and 20 units 30 minutes before dinner. HOLD if not eating. 06/21/17   Ledell Noss, MD  Insulin Syringe-Needle U-100 (INSULIN SYRINGE .3CC/31GX5/16") 31G X 5/16" 0.3 ML MISC 1 Dose by Does not apply route 2 (two) times daily. IM program 06/21/17   Ledell Noss, MD  MICROLET LANCETS MISC 1 Device by Does not apply route 3 (three) times daily. 06/26/17   Ledell Noss, MD  Multiple Vitamin (MULTIVITAMIN) tablet Take 1 tablet by mouth daily.    [provider]  naproxen sodium (ALEVE) 220 MG tablet Take 660 mg by mouth daily as needed (foot pain).     [provider]  ONE TOUCH LANCETS MISC Use to check blood sugars 08/14/12   Wilber Oliphant, MD  pravastatin (PRAVACHOL) 40 MG tablet Take 1 tablet (40 mg total) by mouth daily. 06/21/17   Ledell Noss, MD  sitaGLIPtin (JANUVIA) 100 MG tablet Take 1 tablet (100 mg total) by mouth daily. IM program 06/26/17   Ledell Noss, MD    Family  History Family History  Problem Relation Age of Onset  . Coronary artery disease Father   . Diabetes Sister   . Stroke Sister     Social History Social History   Tobacco Use  . Smoking status: Never Smoker  Substance Use Topics  . Alcohol use: No    Alcohol/week: 0.0 standard drinks  . Drug use: No     Allergies   Lisinopril; Eggs or egg-derived products; and Shellfish allergy   Review of Systems Review of Systems  Constitutional: Negative for chills and fever.  Musculoskeletal: Positive for gait problem and myalgias.  Skin: Positive for color change and wound.  Allergic/Immunologic: Positive for immunocompromised state.  Neurological: Negative for  weakness and numbness.  Hematological: Negative for adenopathy. Does not bruise/bleed easily.  Psychiatric/Behavioral: Negative for confusion.  All other systems reviewed and are negative.    Physical Exam Updated Vital Signs BP (!) 172/90 (BP Location: Right Arm)   Pulse 96   Temp 98.9 F (37.2 C)   Resp 18   Ht 5\' 3"  (1.6 m)   Wt 77.1 kg   SpO2 97%   BMI 30.11 kg/m   Physical Exam  Constitutional: She is oriented to person, place, and time. She appears well-developed and well-nourished. No distress.  HENT:  Head: Normocephalic and atraumatic.  Cardiovascular: Intact distal pulses.  Pulmonary/Chest: Effort normal.  Musculoskeletal: She exhibits tenderness.       Feet:  Neurological: She is alert and oriented to person, place, and time. No sensory deficit.  Skin: Skin is warm and dry. She is not diaphoretic. There is erythema.  Psychiatric: She has a normal mood and affect. Her behavior is normal.  Nursing note and vitals reviewed.    ED Treatments / Results  Labs (all labs ordered are listed, but only abnormal results are displayed) Labs Reviewed  BASIC METABOLIC PANEL - Abnormal; Notable for the following components:      Result Value   Glucose, Bld 283 (*)    GFR calc non Af Amer 57 (*)    All other components within normal limits  CBC WITH DIFFERENTIAL/PLATELET - Abnormal; Notable for the following components:   WBC 17.4 (*)    Hemoglobin 11.8 (*)    Neutro Abs 13.7 (*)    Monocytes Absolute 1.3 (*)    Abs Immature Granulocytes 0.10 (*)    All other components within normal limits    EKG None  Radiology Dg Foot Complete Right  Result Date: 11/28/2017 CLINICAL DATA:  Diabetic foot wound of the bottom of right foot near the toes. EXAM: RIGHT FOOT COMPLETE - 3+ VIEW COMPARISON:  11/25/2017 FINDINGS: There is no evidence of fracture or dislocation. There is no periosteal reaction or bone destruction. There is mild osteoarthritis of the first MTP joint.  There is a small plantar calcaneal spur. IMPRESSION: No radiographic evidence of osteomyelitis of the right foot. Electronically Signed   By: Kathreen Devoid   On: 11/28/2017 14:22    Procedures Procedures (including critical care time)  Medications Ordered in ED Medications  vancomycin (VANCOCIN) 1,250 mg in sodium chloride 0.9 % 250 mL IVPB (has no administration in time range)     Initial Impression / Assessment and Plan / ED Course  I have reviewed the triage vital signs and the nursing notes.  Pertinent labs & imaging results that were available during my care of the patient were reviewed by me and considered in my medical decision making (see chart for details).  Clinical  Course as of Nov 28 1445  Tue Nov 28, 2017  1402 67yo female with history of insulin dependent diabetes returns for worsening right foot wound, taking Keflex as prescribed on 11/25/17. Foot wound compared to previous chart picture, worsening foot wound. Patient seen by Dr. Stark Jock, er attending, agrees with plan for IV Vanc, plan to admit to hospitalist.    [LM]  1433 WBC elevated at 17.4, hyperglycemia with glucose 283, review of last PCP note, patient is seen by the internal medicine residents clinic- last A1C10. XR foot negative for osteomyelitis.    [LM]  1779 Case discussed with Internal Medicine service who will see the patient. Discussed results and plan of care with patient.    [LM]    Clinical Course User Index [LM] Tacy Learn, PA-C   Final Clinical Impressions(s) / ED Diagnoses   Final diagnoses:  Diabetic foot infection [facility])    ED Discharge Orders    None       Tacy Learn, PA-C 11/28/17 1447    Veryl Speak, MD 11/28/17 1534

## 2017-11-28 NOTE — H&P (Addendum)
Date: 11/28/2017               Patient Name:  Mandy Mullen MRN: [id number]  DOB: 02/28/1950 Age / Sex: 67 y.o., female   PCP: Ledell Noss, MD         Medical Service: Internal Medicine Teaching Service         Attending Physician: Dr. Rebeca Alert Raynaldo Opitz, MD    First Contact: Dr. Koleen Distance Pager: [phone]  Second Contact: Dr. Tarri Abernethy Pager: [phone]       After Hours (After 5p/  First Contact Pager: [phone]  weekends / holidays): Second Contact Pager: [phone]   Chief Complaint: Foot Pain  History of Present Illness: Mandy Mullen is a 66 y.o female with uncontrolled DM who presented to the ED with a progressive right foot pain of 4-5 days duration. 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 Initially she did not think much about it, however it became red, swollen, and painful. She presented to the ED on 11/9 for further evaluation. At that time it was found to be erythematous without purulent discharge and the patient had no systemic signs of infectious so she was discharged home on Cephalexin 500 mg QID. After a day of antibiotic therapy she thought her pain was improving and she began soaking her feet in Epson salt baths. She then noted purulent drainage and her foot became more swollen and painful. Today she noted that she was unable to walk on the foot due to the pain and came to the ED for further evaluation. Associated symptoms include chills. She denies fevers, headaches, SOB, CP, N/V, abdominal pain, diarrhea, dysuria, polyuria.   Her major concern is amputation. She has a sister that had uncontrolled diabetes resulting in an amputation and MI. She subsequently passed due to complications of these issues.   Meds:  Current Meds  Medication Sig  . amLODipine (NORVASC) 10 MG tablet Take 1 tablet (10 mg total) by mouth daily. IM PROGRAM  . cephALEXin (KEFLEX) 500 MG capsule  Take 1 capsule (500 mg total) by mouth 4 (four) times daily.  . insulin NPH Human (NOVOLIN N) 100 UNIT/ML injection Take 30 units 91minutes before breakfast and 20 units 30 minutes before dinner. HOLD if not eating. (Patient taking differently: Inject 20-30 Units into the skin See admin instructions. Take 30 units 42minutes before breakfast and 20 units 30 minutes before dinner. HOLD if not eating.)  . losartan (COZAAR) 50 MG tablet Take 50 mg by mouth daily.  . Multiple Vitamin (MULTIVITAMIN) tablet Take 1 tablet by mouth daily.  . naproxen sodium (ALEVE) 220 MG tablet Take 440 mg by mouth daily as needed (foot pain).   . pravastatin (PRAVACHOL) 40 MG tablet Take 1 tablet (40 mg total) by mouth daily.   Allergies: Allergies as of 11/28/2017 - Review Complete 11/28/2017  Allergen Reaction Noted  . Lisinopril Itching, Swelling, and Rash 04/11/2012  . Eggs or egg-derived products  12/17/2007  . Shellfish allergy  11/28/2017   Past Medical History:  Diagnosis Date  . Diabetes mellitus   . History of herpes genitalis   . Hyperlipidemia   . Hypertension   . MRSA (methicillin resistant Staphylococcus aureus)    Reccurent MRSA abscesses   Family History:  + DM, HTN, CAD  Social History:  Lives at home alone Denies the use of tobacco, EtOH, or illicit  substances  Review of Systems: A complete ROS was negative except as per HPI.   Physical Exam: Blood pressure (!) 172/90, pulse 96, temperature 98.9 F (37.2 C), resp. rate 18, height 5\' 3"  (1.6 m), weight 77.1 kg, SpO2 97 %.  General: Well nourished female in no acute distress HENT: Normocephalic, atraumatic, moist mucus membranes Pulm: Good air movement with no wheezing or crackles  CV: RRR, no murmurs, no rubs  Abdomen: Active bowel sounds, soft, non-distended, no tenderness to palpation  Extremities: Pulses palpable in the lower extremities, no LE edema  Skin: Warm and dry  Neuro: Alert and oriented x 3      DG Right Foot:  No radiographic evidence of osteomyelitis of the right foot.  Assessment & Plan by Problem: Active Problems:   Diabetic foot ulcer (HCC)  Mandy Mullen is a 67 y.o female with uncontrolled DM who presented to the ED with a progressive right foot pain of 4-5 days duration and findings consistent with a diabetic foot wound. She was subsequently admitted for further evaluation and management.   Diabetic Foot Wound  - No fevers, chills, N/V. Labs significant for leukocytosis. No other SIRS criteria. - PE does seem to have an abscess  - CXR without evidence of osteomyelitis or foreign body. Although this does not rule out osteomyelitis. Given time course I have a low suspicion for osteo and will not pursue MRI.  - Palpable dorsalis pedis and posterior tibial pulses. Will not pursue ABIs at this point.  - Classify as mild infection. Would recommend IV vancomycin overnight, trend CBC, and plan to discharge on Doxycycline 7-14 days (cover for MRSA given purulence). No need for typical diabetic foot wound coverage (polymicrobial) given that this is not a chronic wound. Patient may benefit from I&D.   Uncontrolled DM  - Last A1c 10.1  - Lantus 25 units QHS and Resistant SSI   Hypertension  - Continue amlodipine and losartan   Diet: Carb modified  VTE ppx: Lovenox  CODE STATUS: Full code  Dispo: Admit patient to Observation with expected length of stay less than 2 midnights.  Signed: Ina Homes, MD 11/28/2017, 4:40 PM

## 2017-11-28 NOTE — Progress Notes (Signed)
Pharmacy Antibiotic Note  Mandy Mullen is a 68 y.o. female admitted on 11/28/2017 with Foot wound after being started on keflex outpt 11/25/17, now worsening.  Pharmacy has been consulted for vancomycin dosing.  SCr 1 , CrCl~ 54 mL/min  Plan: Vancomycin 1250mg  IV every 24h (target trough 10-15) Monitor renal function, clinical progression and vancomycin level at steady state  Height: 5\' 3"  (160 cm) Weight: 170 lb (77.1 kg) IBW/kg (Calculated) : 52.4  Temp (24hrs), Avg:98.9 F (37.2 C), Min:98.9 F (37.2 C), Max:98.9 F (37.2 C)  No results for input(s): WBC, CREATININE, LATICACIDVEN, VANCOTROUGH, VANCOPEAK, VANCORANDOM, GENTTROUGH, GENTPEAK, GENTRANDOM, TOBRATROUGH, TOBRAPEAK, TOBRARND, AMIKACINPEAK, AMIKACINTROU, AMIKACIN in the last 168 hours.  CrCl cannot be calculated (Patient's most recent lab result is older than the maximum 21 days allowed.).    Allergies  Allergen Reactions  . Lisinopril Itching, Swelling and Rash    Angioedema and diffuse body rash  . Eggs Or Egg-Derived Products     REACTION: Nausea. Chills/fevers x 6 months (after a flu shot in the 1990s).  . Shellfish Allergy     Antimicrobials this admission: Vanc 11/12>>  No Cx collected at this time  Bertis Ruddy, PharmD Clinical Pharmacist Please check AMION for all [place] numbers 11/28/2017 2:23 PM

## 2017-11-29 ENCOUNTER — Inpatient Hospital Stay (HOSPITAL_COMMUNITY): Payer: Medicare Other

## 2017-11-29 DIAGNOSIS — L02611 Cutaneous abscess of right foot: Secondary | ICD-10-CM | POA: Diagnosis present

## 2017-11-29 DIAGNOSIS — Z79899 Other long term (current) drug therapy: Secondary | ICD-10-CM | POA: Diagnosis not present

## 2017-11-29 DIAGNOSIS — Z794 Long term (current) use of insulin: Secondary | ICD-10-CM | POA: Diagnosis not present

## 2017-11-29 DIAGNOSIS — E1165 Type 2 diabetes mellitus with hyperglycemia: Secondary | ICD-10-CM | POA: Diagnosis present

## 2017-11-29 DIAGNOSIS — E11621 Type 2 diabetes mellitus with foot ulcer: Secondary | ICD-10-CM | POA: Diagnosis present

## 2017-11-29 DIAGNOSIS — Z8614 Personal history of Methicillin resistant Staphylococcus aureus infection: Secondary | ICD-10-CM | POA: Diagnosis not present

## 2017-11-29 DIAGNOSIS — I129 Hypertensive chronic kidney disease with stage 1 through stage 4 chronic kidney disease, or unspecified chronic kidney disease: Secondary | ICD-10-CM | POA: Diagnosis present

## 2017-11-29 DIAGNOSIS — Z23 Encounter for immunization: Secondary | ICD-10-CM | POA: Diagnosis present

## 2017-11-29 DIAGNOSIS — L97519 Non-pressure chronic ulcer of other part of right foot with unspecified severity: Secondary | ICD-10-CM | POA: Diagnosis present

## 2017-11-29 DIAGNOSIS — L089 Local infection of the skin and subcutaneous tissue, unspecified: Secondary | ICD-10-CM

## 2017-11-29 DIAGNOSIS — Z833 Family history of diabetes mellitus: Secondary | ICD-10-CM | POA: Diagnosis not present

## 2017-11-29 DIAGNOSIS — E1122 Type 2 diabetes mellitus with diabetic chronic kidney disease: Secondary | ICD-10-CM | POA: Diagnosis present

## 2017-11-29 DIAGNOSIS — Z90710 Acquired absence of both cervix and uterus: Secondary | ICD-10-CM | POA: Diagnosis not present

## 2017-11-29 DIAGNOSIS — E785 Hyperlipidemia, unspecified: Secondary | ICD-10-CM | POA: Diagnosis present

## 2017-11-29 DIAGNOSIS — Z8249 Family history of ischemic heart disease and other diseases of the circulatory system: Secondary | ICD-10-CM | POA: Diagnosis not present

## 2017-11-29 DIAGNOSIS — Z91013 Allergy to seafood: Secondary | ICD-10-CM | POA: Diagnosis not present

## 2017-11-29 DIAGNOSIS — Z791 Long term (current) use of non-steroidal anti-inflammatories (NSAID): Secondary | ICD-10-CM | POA: Diagnosis not present

## 2017-11-29 DIAGNOSIS — B9561 Methicillin susceptible Staphylococcus aureus infection as the cause of diseases classified elsewhere: Secondary | ICD-10-CM | POA: Diagnosis present

## 2017-11-29 DIAGNOSIS — N183 Chronic kidney disease, stage 3 (moderate): Secondary | ICD-10-CM | POA: Diagnosis present

## 2017-11-29 DIAGNOSIS — Z1629 Resistance to other single specified antibiotic: Secondary | ICD-10-CM | POA: Diagnosis present

## 2017-11-29 DIAGNOSIS — E11628 Type 2 diabetes mellitus with other skin complications: Secondary | ICD-10-CM | POA: Diagnosis present

## 2017-11-29 DIAGNOSIS — Z888 Allergy status to other drugs, medicaments and biological substances status: Secondary | ICD-10-CM | POA: Diagnosis not present

## 2017-11-29 DIAGNOSIS — Z823 Family history of stroke: Secondary | ICD-10-CM | POA: Diagnosis not present

## 2017-11-29 DIAGNOSIS — Z91012 Allergy to eggs: Secondary | ICD-10-CM | POA: Diagnosis not present

## 2017-11-29 LAB — CBC
HCT: 31.4 % — ABNORMAL LOW (ref 36.0–46.0)
Hemoglobin: 9.7 g/dL — ABNORMAL LOW (ref 12.0–15.0)
MCH: 27.6 pg (ref 26.0–34.0)
MCHC: 30.9 g/dL (ref 30.0–36.0)
MCV: 89.5 fL (ref 80.0–100.0)
NRBC: 0 % (ref 0.0–0.2)
PLATELETS: 375 10*3/uL (ref 150–400)
RBC: 3.51 MIL/uL — ABNORMAL LOW (ref 3.87–5.11)
RDW: 13.1 % (ref 11.5–15.5)
WBC: 17.9 10*3/uL — ABNORMAL HIGH (ref 4.0–10.5)

## 2017-11-29 LAB — MRSA PCR SCREENING: MRSA by PCR: NEGATIVE

## 2017-11-29 LAB — BASIC METABOLIC PANEL
Anion gap: 6 (ref 5–15)
BUN: 12 mg/dL (ref 8–23)
CALCIUM: 8.7 mg/dL — AB (ref 8.9–10.3)
CO2: 24 mmol/L (ref 22–32)
CREATININE: 1.09 mg/dL — AB (ref 0.44–1.00)
Chloride: 106 mmol/L (ref 98–111)
GFR, EST AFRICAN AMERICAN: 60 mL/min — AB (ref >60)
GFR, EST NON AFRICAN AMERICAN: 52 mL/min — AB (ref >60)
Glucose, Bld: 130 mg/dL — ABNORMAL HIGH (ref 70–99)
Potassium: 3.9 mmol/L (ref 3.5–5.1)
Sodium: 136 mmol/L (ref 135–145)

## 2017-11-29 LAB — GLUCOSE, CAPILLARY
Glucose-Capillary: 66 mg/dL — ABNORMAL LOW (ref 70–99)
Glucose-Capillary: 222 mg/dL — ABNORMAL HIGH (ref 70–99)
Glucose-Capillary: 287 mg/dL — ABNORMAL HIGH (ref 70–99)
Glucose-Capillary: 277 mg/dL — ABNORMAL HIGH (ref 70–99)
Glucose-Capillary: 197 mg/dL — ABNORMAL HIGH (ref 70–99)

## 2017-11-29 LAB — SEDIMENTATION RATE: Sed Rate: 110 mm/hr — ABNORMAL HIGH (ref 0–22)

## 2017-11-29 LAB — C-REACTIVE PROTEIN: CRP: 8.3 mg/dL — ABNORMAL HIGH (ref <1.0)

## 2017-11-29 LAB — HIV ANTIBODY (ROUTINE TESTING W REFLEX): HIV Screen 4th Generation wRfx: NONREACTIVE

## 2017-11-29 MED ORDER — GADOBUTROL 1 MMOL/ML IV SOLN
7.0000 mL | Freq: Once | INTRAVENOUS | Status: AC | PRN
  Administered 2017-11-29: 7 mL via INTRAVENOUS

## 2017-11-29 MED ORDER — INSULIN GLARGINE 100 UNIT/ML ~~LOC~~ SOLN
20.0000 [IU] | Freq: Every day | SUBCUTANEOUS | Status: DC
  Administered 2017-11-29: 20 [IU] via SUBCUTANEOUS
  Filled 2017-11-29 (×2): qty 0.2

## 2017-11-29 MED ORDER — INSULIN ASPART 100 UNIT/ML ~~LOC~~ SOLN
0.0000 [IU] | Freq: Three times a day (TID) | SUBCUTANEOUS | Status: DC
  Administered 2017-11-29 – 2017-11-30 (×2): 8 [IU] via SUBCUTANEOUS
  Administered 2017-11-30: 2 [IU] via SUBCUTANEOUS
  Filled 2017-11-29: qty 1

## 2017-11-29 MED ORDER — INSULIN ASPART 100 UNIT/ML ~~LOC~~ SOLN
4.0000 [IU] | Freq: Three times a day (TID) | SUBCUTANEOUS | Status: DC
  Administered 2017-11-29 – 2017-11-30 (×3): 4 [IU] via SUBCUTANEOUS
  Filled 2017-11-29: qty 1

## 2017-11-29 NOTE — Progress Notes (Signed)
   Subjective: Mandy Mullen was seen and evaluated at bedside. No acute events overnight. She is still having some pain in her foot which is improved with Percocet. Denies fevers, chills, n/v, numbness/tingling in the foot. Has not noted any further drainage from the foot. Discussed plan for MRI of the foot to rule out deeper infection given her elevated inflammatory markers. Patient was in agreement and all questions were addressed.    Objective:  Vital signs in last 24 hours: Vitals:   11/28/17 1818 11/29/17 0012 11/29/17 0518 11/29/17 1207  BP: (!) 161/84 (!) 114/58 125/66 140/75  Pulse: 94 87 80 89  Resp: '16 18 18 20  '$ Temp: 99.3 F (37.4 C) 98.6 F (37 C) 98.5 F (36.9 C) 98.6 F (37 C)  TempSrc: Oral Oral Oral Oral  SpO2: 99% 93% 94% 96%  Weight:      Height:       General: awake, alert, pleasant female sitting up in bed in NAD CV: RRR; no murmurs, rubs or gallops  Pulm: normal respiratory effort; lungs CTA bilaterally Abd: BS+; abdomen is soft, non-distended, non-tender Ext: distal pulses intact MSK: dorsal aspect of right foot with edema and mild tenderness; plantar aspect with 2.5x3 cm superficial abscess that is TTP; no active drainage; no pain with ROM of 4th and 5th toes; sensation intact throughout    Assessment/Plan:  Active Problems:   Diabetic foot ulcer (HCC)  Mandy Mullen is a 67 y.o female with uncontrolled DM who presented to the ED with a progressive right foot pain of 4-5 days duration and findings consistent with a diabetic foot wound. She was subsequently admitted for further evaluation and management.   Diabetic Foot Wound  - No systemic signs or symptoms of infection -  Labs significant for leukocytosis, elevated ESR and CRP  - patient has good sensation of her foot. Given time course and superficial wound, have lower suspicion for osteo. However, with elevated ESR and CRP will order MRI to officially rule out.  - continue IV vanc - if MRI is  negative, will likely pursue I&D    Uncontrolled DM  - Last A1c 10.1  - patient's morning CBG was 66. Will decrease Lantus to 20 units qhs and adjust SSI to moderate   Dispo: Anticipated discharge in approximately 1 day(s).   Modena Nunnery D, DO 11/29/2017, 3:19 PM Pager: [phone]

## 2017-11-29 NOTE — Accreditation Note (Signed)
Pt's sugar was 66 so given apple juice at this time.

## 2017-11-29 NOTE — Progress Notes (Signed)
Inpatient Diabetes Program Recommendations  AACE/ADA: New Consensus Statement on Inpatient Glycemic Control (2015)  Target Ranges:  Prepandial:   less than 140 mg/dL      Peak postprandial:   less than 180 mg/dL (1-2 hours)      Critically ill patients:  140 - 180 mg/dL   Lab Results  Component Value Date   GLUCAP 222 (H) 11/29/2017   HGBA1C 10.1 (A) 06/19/2017    Review of Glycemic ControlResults for Mandy Mullen, Mandy Mullen (MRN [id number]) as of 11/29/2017 12:39  Ref. Range 11/28/2017 18:17 11/28/2017 21:31 11/29/2017 06:46 11/29/2017 11:59  Glucose-Capillary Latest Ref Range: 70 - 99 mg/dL 221 (H) 172 (H) 66 (L) 222 (H)    Diabetes history: DM 2 Outpatient Diabetes medications:  NPH 30 units with breakfast and NPH 20 units with dinner Current orders for Inpatient glycemic control:  Novolog resistant tid with meals and HS, Lantus 25 units q HS Inpatient Diabetes Program Recommendations:   Please consider reducing Lantus to 20 units q HS. Also consider reducing Novolog correction to moderate tid with meals and HS.   Thanks,  Adah Perl, RN, BC-ADM Inpatient Diabetes Coordinator Pager [phone] (8a-5p)

## 2017-11-29 NOTE — Progress Notes (Signed)
Pt transferred via wheelchair from [facility] to 5W09. Pt A&Ox4, pleasant. VSS. Complains of 10 out of 10 pain in right foot. Percocet 1 tab PO given. Pt oriented to room and plan of care. Call bell within reach.

## 2017-11-30 DIAGNOSIS — B9561 Methicillin susceptible Staphylococcus aureus infection as the cause of diseases classified elsewhere: Secondary | ICD-10-CM

## 2017-11-30 DIAGNOSIS — Z1629 Resistance to other single specified antibiotic: Secondary | ICD-10-CM

## 2017-11-30 DIAGNOSIS — L02611 Cutaneous abscess of right foot: Secondary | ICD-10-CM

## 2017-11-30 LAB — CBC
HCT: 31.2 % — ABNORMAL LOW (ref 36.0–46.0)
Hemoglobin: 10 g/dL — ABNORMAL LOW (ref 12.0–15.0)
MCH: 28.2 pg (ref 26.0–34.0)
MCHC: 32.1 g/dL (ref 30.0–36.0)
MCV: 88.1 fL (ref 80.0–100.0)
Platelets: 367 10*3/uL (ref 150–400)
RBC: 3.54 MIL/uL — ABNORMAL LOW (ref 3.87–5.11)
RDW: 13 % (ref 11.5–15.5)
WBC: 12.7 10*3/uL — ABNORMAL HIGH (ref 4.0–10.5)
nRBC: 0 % (ref 0.0–0.2)

## 2017-11-30 LAB — GLUCOSE, CAPILLARY
Glucose-Capillary: 139 mg/dL — ABNORMAL HIGH (ref 70–99)
Glucose-Capillary: 214 mg/dL — ABNORMAL HIGH (ref 70–99)
Glucose-Capillary: 253 mg/dL — ABNORMAL HIGH (ref 70–99)

## 2017-11-30 MED ORDER — LIDOCAINE HCL (CARDIAC) PF 100 MG/5ML IV SOSY
PREFILLED_SYRINGE | INTRAVENOUS | Status: AC
  Filled 2017-11-30: qty 5

## 2017-11-30 MED ORDER — LIDOCAINE HCL (PF) 1 % IJ SOLN
30.0000 mL | Freq: Once | INTRAMUSCULAR | Status: AC
  Administered 2017-11-30: 30 mL
  Filled 2017-11-30: qty 30

## 2017-11-30 MED ORDER — OXYCODONE-ACETAMINOPHEN 5-325 MG PO TABS: 1.0000 | ORAL_TABLET | Freq: Four times a day (QID) | ORAL | 0 refills | Status: AC | PRN

## 2017-11-30 MED ORDER — CLINDAMYCIN HCL 300 MG PO CAPS: 300.0000 mg | ORAL_CAPSULE | Freq: Three times a day (TID) | ORAL | 0 refills | Status: AC

## 2017-11-30 NOTE — Consult Note (Addendum)
[place] Nurse wound consult note Patient's chart entries reviewed. Reason for Consult: Right foot infected puncture wound and cellulitis. Off loading of right foot wound. Wound type: Infected puncture wound with cellulitis Measurement: A very small puncture entry is seen in the photo in the patient's record. Wound bed: Discoloration of the plantar foot observable via photo. Drainage (amount, consistency, odor) As described in the MD note from Dr. Koleen Distance 11/29/17 at 3:19 pm.   Periwound: See MD note referenced above. Plan of care is for the IM team to drain the abscess around mid-day.  Dr. Koleen Distance is interested in getting packing and dressing supplies and having [facility] for off-loading the wound post I&D.   Plan of care for the I&D site once performed: Pack with 1/4 inch Iodoform strip, cover with small foam dressing.  Change the packing twice daily. If the foam foam dressing is not soiled, it can be changed daily.  Thank you for the consult.  Discussed plan of care with Dr. Koleen Distance.  [facility] nurse will not follow at this time.  Please re-consult the [facility] team if needed.  Val Riles, RN, MSN, CWOCN, CNS-BC, pager [phone]

## 2017-11-30 NOTE — Progress Notes (Signed)
   Subjective: Mandy Mullen was seen and evaluated at bedside on morning rounds. No acute events overnight. Still having foot pain, but fairly well controlled with Percocet. No fevers, chills, n/v. No drainage from the wound. She is able to move all of her toes fairly well without pain. No numbness/tingling. Discussed MRI results with patient that there was no infection in the bone. We will plan to drain later today and discharge home on oral antibiotics. All questions and concerns were addressed.   Objective:  Vital signs in last 24 hours: Vitals:   11/29/17 1839 11/29/17 1854 11/29/17 2246 11/30/17 0437  BP: 128/64 (!) 156/73 133/62 (!) 113/59  Pulse: 87 91 79 80  Resp: '18 18 20 14  '$ Temp: 99.2 F (37.3 C) 99.1 F (37.3 C) 98.6 F (37 C) 98.6 F (37 C)  TempSrc: Oral  Oral Oral  SpO2: 96% 93% 97% 95%  Weight:      Height:       General: awake, alert pleasant female lying in bed in NAD CV: RRR; no murmurs, rubs or gallops Pulm: normal respiratory effort; lungs CTA bilaterally Abd: BS+; abdomen is soft, non-distended, non-tender Ext: distal pulses intact MSK: foot exam unchanged from yesterday; no increased warmth or drainage.   Assessment/Plan:  Principal Problem:   Diabetic foot infection ([place]) Active Problems:   Uncontrolled type 2 diabetes mellitus ([place])   Essential hypertension   CKD stage 3 due to type 2 diabetes mellitus ([place])   Diabetic foot ulcer (HCC)  Mandy Mullen is a 67 y.o female with uncontrolled DM who presented to the ED with a progressive right foot pain of 4-5 days duration and findings consistent with a diabetic foot wound. She was subsequently admitted for further evaluation and management.   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  - plan for  I&D today. [facility] has been consulted to assist with offloading afterwards  - will transition to oral Clindamycin for polymicrobial coverage   Uncontrolled DM  - Last A1c 10.1  - will continue home diabetes regimen at discharge    Dispo: Anticipated discharge home today.   Modena Nunnery D, DO 11/30/2017, 8:34 AM Pager: [phone]

## 2017-11-30 NOTE — Progress Notes (Signed)
  Date: 11/30/2017  Patient name: Mandy Mullen  Medical record number: [id number]  Date of birth: [birth year]   I have seen and evaluated this patient and I have discussed the plan of care with the house staff. Please see their note for complete details. I concur with their findings with the following additions/corrections:   MRI showed no evidence of osteomyelitis.  There is, however extensive inflammation of the soft tissues and muscles, but without purulent tenosynovitis.  I personally supervised Dr. Koleen Distance performing a I&D of the superficial abscess, which produced a small amount of clear fluid as well as a small amount of purulent fluid.  As soon as she has an offloading boot to wear at home to keep the pressure off this diabetic wound, she can go home.  She will need to continue on oral antibiotics and follow-up in our clinic next week to ensure improvement of the wound.  We will need to recheck her ESR and CRP and follow-up as well and ensure they are decreasing.  Lenice Pressman, M.D., Ph.D. 11/30/2017, 4:57 PM

## 2017-11-30 NOTE — Discharge Summary (Addendum)
Name: Mandy Mullen MRN: [id number] DOB: [birth year] 67 y.o. PCP: Ledell Noss, MD  Date of Admission: 11/28/2017  1:15 PM Date of Discharge: 11/30/2017 Attending Physician: Lenice Pressman, MD  Discharge Diagnosis: 1. Diabetic foot infection  Discharge Medications: Allergies as of 11/30/2017      Reactions   Lisinopril Itching, Swelling, Rash   Angioedema and diffuse body rash   Eggs Or Egg-derived Products    REACTION: Nausea. Chills/fevers x 6 months (after a flu shot in the 1990s).   Shellfish Allergy       Medication List    STOP taking these medications   cephALEXin 500 MG capsule Commonly known as:  KEFLEX     TAKE these medications   amLODipine 10 MG tablet Commonly known as:  NORVASC Take 1 tablet (10 mg total) by mouth daily. IM PROGRAM   clindamycin 300 MG capsule Commonly known as:  CLEOCIN Take 1 capsule (300 mg total) by mouth 3 (three) times daily for 7 days.   glucose blood test strip Use as instructed   glucose blood test strip Test blood sugar 3 times daily. IM program   insulin NPH Human 100 UNIT/ML injection Commonly known as:  HUMULIN N,NOVOLIN N Take 30 units 39mnutes before breakfast and 20 units 30 minutes before dinner. HOLD if not eating. What changed:    how much to take  how to take this  when to take this   INSULIN SYRINGE .3CC/31GX5/16" 31G X 5/16" 0.3 ML Misc 1 Dose by Does not apply route 2 (two) times daily. IM program   losartan 50 MG tablet Commonly known as:  COZAAR Take 50 mg by mouth daily.   multivitamin tablet Take 1 tablet by mouth daily.   naproxen sodium 220 MG tablet Commonly known as:  ALEVE Take 440 mg by mouth daily as needed (foot pain).   ONE TOUCH LANCETS Misc Use to check blood sugars   MICROLET LANCETS Misc 1 Device by Does not apply route 3 (three) times daily.   oxyCODONE-acetaminophen 5-325 MG tablet Commonly known as:  PERCOCET/ROXICET Take 1 tablet by mouth every 6 (six) hours as  needed for up to 5 days for moderate pain.   pravastatin 40 MG tablet Commonly known as:  PRAVACHOL Take 1 tablet (40 mg total) by mouth daily.   sitaGLIPtin 100 MG tablet Commonly known as:  JANUVIA Take 1 tablet (100 mg total) by mouth daily. IM program       Disposition and follow-up:   [facility]  At the hospital follow up visit please address:  1.  Diabetic foot wound: s/p I&D on 11/14. Please ensure the wound is healing. She was given a prescription for Clindamycin x 7 days. However, her wound culture grew Staph resistant to Clinda and was called in a second prescription for Bactrim on 11/17 for 10 days. Patient will need note for when she can return to work.   2.  Labs / imaging needed at time of follow-up: CBC, sed rate, CRP   3.  Pending labs/ test needing follow-up: none   Follow-up Appointments:   Hospital Course by problem list: Mandy Mullen a 67y.o female with uncontrolled DM who presented to the ED with a progressive right foot pain of 4-5 days duration and findings consistent with a diabetic foot infection. She was subsequently admitted for further evaluation and management.  1. Diabetic foot wound: Leukocytosis on admission was  17.9 which improved to 12.7 with 2 days of IV vancomycin. She did not have any systemic signs or symptoms of infection. CRP and ESR were 8.3 and 110 respectively so MRI was ordered to rule out acute osteo which was negative, but did show extension of the inflammation into the deeper tissues of the foot. Superficial abscess was incised and drained prior to discharge, and she was sent home with orthopedic boot to offload the wound as well as prescriptions for Clindamycin and Percocet for pain. Wound culture done during I&D grew Staph aureus resistant to Clindamycin. Patient was called regarding these results, and a 10 day course of bactrim was sent in to take in addition  to the Clindamycin.   Discharge Vitals:   BP 124/63 (BP Location: Left Arm)   Pulse 80   Temp 98.6 F (37 C) (Oral)   Resp 16   Ht _0  (1.6 m)   Wt 77.1 kg   SpO2 95%   BMI 30.11 kg/m   Pertinent Labs, Studies, and Procedures:  CRP - 8.3  ESR - 110 CBC Latest Ref Rng & Units 11/30/2017 11/29/2017 11/28/2017  WBC 4.0 - 10.5 K/uL 12.7(H) 17.9(H) 17.4(H)  Hemoglobin 12.0 - 15.0 g/dL 10.0(L) 9.7(L) 11.8(L)  Hematocrit 36.0 - 46.0 % 31.2(L) 31.4(L) 37.1  Platelets 150 - 400 K/uL 367 375 396     Discharge Instructions: Discharge Instructions    Call MD for:  redness, tenderness, or signs of infection (pain, swelling, redness, odor or green/yellow discharge around incision site)   Complete by:  As directed    Call MD for:  severe uncontrolled pain   Complete by:  As directed    Call MD for:  temperature >100.4   Complete by:  As directed    Diet - low sodium heart healthy   Complete by:  As directed    Discharge instructions   Complete by:  As directed    [name],  It was a pleasure taking care of you. I hope your foot continues to get better. I am giving you a week's worth of antibiotics to take. I will also send a message to our clinic to have you follow-up sometime next week to check your wound. I am also giving you another 5 day's worth of pain medicine to take as needed for severe pain. Change the dressing once daily or more if needed depending on the drainage. I have written a work note which can be updated again at your follow-up appointment in clinic.  Take care! Dr. Koleen Distance   Increase activity slowly   Complete by:  As directed       Signed: Delice Bison, DO 12/03/2017, 7:41 PM   Pager: [phone]   Internal Medicine Attending Note:  I saw and examined the patient on the day of discharge. I reviewed and agree with the discharge summary written by the house staff.  Lenice Pressman, M.D., Ph.D.

## 2017-11-30 NOTE — Progress Notes (Signed)
Orthopedic Tech Progress Note Patient Details:  Mandy Mullen 1950-05-08 [id number]  Ortho Devices Type of Ortho Device: Darco shoe Ortho Device/Splint Location: rle Ortho Device/Splint Interventions: Ordered, Application, Adjustment   Post Interventions Patient Tolerated: Well Instructions Provided: Care of device, Adjustment of device   Karolee Stamps 11/30/2017, 5:38 PM

## 2017-11-30 NOTE — Progress Notes (Signed)
Nsg Discharge Note  Admit Date:  11/28/2017 Discharge date: 11/30/2017   Mandy Mullen to be D/C'd Home per MD order.  AVS completed.  Copy for chart, and copy for patient signed, and dated. Patient/caregiver able to verbalize understanding.  Discharge Medication: Allergies as of 11/30/2017      Reactions   Lisinopril Itching, Swelling, Rash   Angioedema and diffuse body rash   Eggs Or Egg-derived Products    REACTION: Nausea. Chills/fevers x 6 months (after a flu shot in the 1990s).   Shellfish Allergy       Medication List    STOP taking these medications   cephALEXin 500 MG capsule Commonly known as:  KEFLEX     TAKE these medications   amLODipine 10 MG tablet Commonly known as:  NORVASC Take 1 tablet (10 mg total) by mouth daily. IM PROGRAM   clindamycin 300 MG capsule Commonly known as:  CLEOCIN Take 1 capsule (300 mg total) by mouth 3 (three) times daily for 7 days.   glucose blood test strip Use as instructed   glucose blood test strip Test blood sugar 3 times daily. IM program   insulin NPH Human 100 UNIT/ML injection Commonly known as:  HUMULIN N,NOVOLIN N Take 30 units 49minutes before breakfast and 20 units 30 minutes before dinner. HOLD if not eating. What changed:    how much to take  how to take this  when to take this   INSULIN SYRINGE .3CC/31GX5/16" 31G X 5/16" 0.3 ML Misc 1 Dose by Does not apply route 2 (two) times daily. IM program   losartan 50 MG tablet Commonly known as:  COZAAR Take 50 mg by mouth daily.   multivitamin tablet Take 1 tablet by mouth daily.   naproxen sodium 220 MG tablet Commonly known as:  ALEVE Take 440 mg by mouth daily as needed (foot pain).   ONE TOUCH LANCETS Misc Use to check blood sugars   MICROLET LANCETS Misc 1 Device by Does not apply route 3 (three) times daily.   oxyCODONE-acetaminophen 5-325 MG tablet Commonly known as:  PERCOCET/ROXICET Take 1 tablet by mouth every 6 (six) hours as needed  for up to 5 days for moderate pain.   pravastatin 40 MG tablet Commonly known as:  PRAVACHOL Take 1 tablet (40 mg total) by mouth daily.   sitaGLIPtin 100 MG tablet Commonly known as:  JANUVIA Take 1 tablet (100 mg total) by mouth daily. IM program       Discharge Assessment: Vitals:   11/30/17 0437 11/30/17 1426  BP: (!) 113/59 124/63  Pulse: 80 80  Resp: 14 16  Temp: 98.6 F (37 C) 98.6 F (37 C)  SpO2: 95% 95%   Skin clean, dry and intact without evidence of skin break down, no evidence of skin tears noted. IV catheter discontinued intact. Site without signs and symptoms of complications - no redness or edema noted at insertion site, patient denies c/o pain - only slight tenderness at site.  Dressing with slight pressure applied.  D/c Instructions-Education: Discharge instructions given to patient/family with verbalized understanding. D/c education completed with patient/family including follow up instructions, medication list, d/c activities limitations if indicated, with other d/c instructions as indicated by MD - patient able to verbalize understanding, all questions fully answered. Patient instructed to return to ED, call 911, or call MD for any changes in condition.  Patient escorted via [place], and D/C home via private auto.  Mandy N Trammell Bowden, RN 11/30/2017 6:55 PM

## 2017-11-30 NOTE — Care Management Note (Signed)
Case Management Note  Patient Details  Name: Mandy Mullen MRN: [id number] Date of Birth: 06/15/1950  Subjective/Objective:        Presents with foot pain, hx  of insulin dependent diabetes.               Erin Hearing (Other)     [phone]       PCP: Ledell Noss  Action/Plan: Transition to home when medically stable. No present needs identified per NCM.  Expected Discharge Date:    11/30/2017            Expected Discharge Plan:  Home/Self Care  In-House Referral:  NA  Discharge planning Services  CM Consult  Post Acute Care Choice:  NA Choice offered to:  NA  DME Arranged:    DME Agency:  NA  HH Arranged:  NA HH Agency:  NA  Status of Service:  Completed, signed off  If discussed at [place] of Stay Meetings, dates discussed:    Additional Comments:  Sharin Mons, RN 11/30/2017, 2:34 PM

## 2017-12-03 ENCOUNTER — Telehealth: Payer: Self-pay | Admitting: Internal Medicine

## 2017-12-03 DIAGNOSIS — E11628 Type 2 diabetes mellitus with other skin complications: Secondary | ICD-10-CM

## 2017-12-03 DIAGNOSIS — L089 Local infection of the skin and subcutaneous tissue, unspecified: Secondary | ICD-10-CM

## 2017-12-03 MED ORDER — SULFAMETHOXAZOLE-TRIMETHOPRIM 800-160 MG PO TABS: 2.0000 | ORAL_TABLET | Freq: Two times a day (BID) | ORAL | 0 refills | Status: DC

## 2017-12-03 NOTE — Telephone Encounter (Signed)
Patient's cultures grew staph aureus resistant to clindamycin. Called patient to discuss the results. I will send out a prescription for Bactrim and informed the patient to continue taking clindamycin.

## 2017-12-04 LAB — AEROBIC CULTURE W GRAM STAIN (SUPERFICIAL SPECIMEN): Gram Stain: NONE SEEN

## 2017-12-06 ENCOUNTER — Encounter: Payer: Self-pay | Admitting: Internal Medicine

## 2017-12-06 ENCOUNTER — Ambulatory Visit (INDEPENDENT_AMBULATORY_CARE_PROVIDER_SITE_OTHER): Payer: Self-pay | Admitting: Internal Medicine

## 2017-12-06 ENCOUNTER — Other Ambulatory Visit: Payer: Self-pay

## 2017-12-06 VITALS — BP 138/65 | HR 81 | Temp 98.1°F | Ht 63.0 in | Wt 167.0 lb

## 2017-12-06 DIAGNOSIS — E785 Hyperlipidemia, unspecified: Secondary | ICD-10-CM

## 2017-12-06 DIAGNOSIS — N183 Chronic kidney disease, stage 3 (moderate): Secondary | ICD-10-CM

## 2017-12-06 DIAGNOSIS — Z9189 Other specified personal risk factors, not elsewhere classified: Secondary | ICD-10-CM

## 2017-12-06 DIAGNOSIS — E2839 Other primary ovarian failure: Secondary | ICD-10-CM

## 2017-12-06 DIAGNOSIS — B9561 Methicillin susceptible Staphylococcus aureus infection as the cause of diseases classified elsewhere: Secondary | ICD-10-CM

## 2017-12-06 DIAGNOSIS — E118 Type 2 diabetes mellitus with unspecified complications: Secondary | ICD-10-CM

## 2017-12-06 DIAGNOSIS — Z794 Long term (current) use of insulin: Secondary | ICD-10-CM

## 2017-12-06 DIAGNOSIS — E1122 Type 2 diabetes mellitus with diabetic chronic kidney disease: Secondary | ICD-10-CM

## 2017-12-06 DIAGNOSIS — Z881 Allergy status to other antibiotic agents status: Secondary | ICD-10-CM

## 2017-12-06 DIAGNOSIS — I152 Hypertension secondary to endocrine disorders: Secondary | ICD-10-CM

## 2017-12-06 DIAGNOSIS — E1165 Type 2 diabetes mellitus with hyperglycemia: Secondary | ICD-10-CM

## 2017-12-06 DIAGNOSIS — L089 Local infection of the skin and subcutaneous tissue, unspecified: Secondary | ICD-10-CM

## 2017-12-06 DIAGNOSIS — Z79899 Other long term (current) drug therapy: Secondary | ICD-10-CM

## 2017-12-06 DIAGNOSIS — Z1231 Encounter for screening mammogram for malignant neoplasm of breast: Secondary | ICD-10-CM

## 2017-12-06 DIAGNOSIS — I10 Essential (primary) hypertension: Secondary | ICD-10-CM

## 2017-12-06 DIAGNOSIS — E11628 Type 2 diabetes mellitus with other skin complications: Secondary | ICD-10-CM

## 2017-12-06 DIAGNOSIS — E1159 Type 2 diabetes mellitus with other circulatory complications: Secondary | ICD-10-CM

## 2017-12-06 LAB — POCT GLYCOSYLATED HEMOGLOBIN (HGB A1C): Hemoglobin A1C: 10.7 % — AB (ref 4.0–5.6)

## 2017-12-06 LAB — GLUCOSE, CAPILLARY: Glucose-Capillary: 176 mg/dL — ABNORMAL HIGH (ref 70–99)

## 2017-12-06 MED ORDER — EMPAGLIFLOZIN 10 MG PO TABS: 10.0000 mg | ORAL_TABLET | Freq: Every day | ORAL | 3 refills | Status: DC

## 2017-12-06 MED ORDER — TRIAMCINOLONE ACETONIDE 0.5 % EX OINT: 1.0000 "application " | TOPICAL_OINTMENT | Freq: Two times a day (BID) | CUTANEOUS | 0 refills | Status: DC

## 2017-12-06 MED ORDER — DIPHENHYDRAMINE HCL 50 MG PO TABS: 50.0000 mg | ORAL_TABLET | Freq: Every evening | ORAL | 0 refills | Status: DC | PRN

## 2017-12-06 MED ORDER — AMOXICILLIN-POT CLAVULANATE 875-125 MG PO TABS: 1.0000 | ORAL_TABLET | Freq: Two times a day (BID) | ORAL | 0 refills | Status: DC

## 2017-12-06 NOTE — Progress Notes (Addendum)
CC: follow up of diabetes and hypertension   HPI:  [name] is a 67 y.o. with PMH HTN, HLD, Type II diabetes who presents for  follow up of diabetes and hypertension. Please see the assessment and plans for the status of the patient chronic medical problems.   Review of Systems:  Refer to history of present illness and assessment and plans for pertinent review of systems, all others reviewed and negative  Physical Exam:  Vitals:   12/06/17 1336  BP: 138/65  Pulse: 81  Temp: 98.1 F (36.7 C)  TempSrc: Oral  SpO2: 99%  Weight: 167 lb (75.8 kg)  Height: 5\' 3"  (1.6 m)   General well appearing, no distress  Cardiac regular rate and rhythm, no murmurs rubs or gallops  Pulm: lungs are clear to auscultation bilateral  GI: the abdomen is soft, non tender, non distended  Skin: Skin over the chest, arms, and legs is erythematous some areas appear purple and bruised, the skin of the flexor surfaces is spared. there is a large area of thickened skin with underlying indurated collection on the bottom of the right foot, the bottom of the left foot has three small circular scars, the feet are well groomed   Assessment & Plan:   Type 2 Diabetes Mellitus, uncontrolled, complicated by a foot abscess  Diabetic Foot infection  Patient is here for diabetes follow up. Her diabetes is not controlled. Last hemoglobin A1c 10.1 when last checked, follow up today 10.7. She is prescribed insulin NPH 30 units prior to breakfast and 20 units prior to dinner and sitagliptin 100 mg daily. She did not bring a blood glucose meter with her today, average blood sugar is between 160-200s. Recently she was hospitalized for a diabetic foot infection requiring incision and drainage and antibiotics, she notes that the foot has remained swollen after hospitalization. She was discharged on clindamycin however after sensitivities resulted she was asked to switch to bactrim. She had an allergic reaction to the  bactrim, the skin on her chest and extremities became red, itchy and burning and she developed a blister on her left breast so she stopped taking it after two pills.  - foot exam performed today - the abscess was still present so Dr. Daryll Drown and I performed incision and drainage and sent the drainage for repeat cultures  - stop bactrim, she has a skin rash concerning for erythroderma, this will be added to her allergy list  - prescribed benadryl and triamcinolone ointment for the skin reaction and discussed return precautions  - transition to augmentin with 7 day course  - continue NPH 30 units in the morning 20 units in the evening  - start Jardiance 10 mg daily  - ASCVD 19%, currently prescribed moderate intensity pravastatin 40 mg daily  - due for eye exam, on last exam 3 years ago she did not have evidence of retinopathy, she asked that we place this referral at next office visit as we are addressing other preventative health measures today  - RTC ACC in 1 week   ADDENDUM: Cultures resulted with staph aureus again sensitive to penicillin   Hypertension  Blood pressure is controlled today. Continue Amlodipine 10 mg daily and losartan 50 mg daily.   Preventative health  - declines Flu vaccine today - due for bone density and mammogram, will refer today   Incision and Drainage Procedure Note  Pre-operative Diagnosis: diabetic foot infection   Post-operative Diagnosis:diabetic foot infection   Anesthesia: 1% plain  lidocaine  Procedure Details  The procedure, risks and complications have been discussed in detail (including, but not limited to airway compromise, infection, bleeding) with the patient, and the patient has signed consent to the procedure.  The skin was sterilely prepped and draped over the affected area in the usual fashion. After adequate local anesthesia, I&D with a #11 blade was performed on the right plantar foot abscess. Purulent drainage was found, a sample was  collected and brought to the lab for culture and sensitivity.  The patient was observed until stable. Sterile dressings were applied.  See Encounters Tab for problem based charting.  Patient seen with Dr. Daryll Drown

## 2017-12-06 NOTE — Patient Instructions (Addendum)
Thank you for coming to the clinic today. It was a pleasure to see you.   For your skin reaction please use benadryl to relieve the itchiness and triamcinolone cream   Start taking augmentin 875 mg twice daily for the next week then come back to see [name] in the office  If your rash becomes worse please call the office immediately and let us know   For your diabetes please pick up the jardiance and start taking this when your rash improves   FOLLOW-UP INSTRUCTIONS When: 1 week in the acute care clinic  For: follow up of your diabetes   Please call the internal medicine center clinic if you have any questions or concerns, we may be able to help and keep you from a long and expensive emergency room wait. Our clinic and after hours phone number is [phone], the best time to call is Monday through Friday 9 am to 4 pm but there is always someone available 24/7 if you have an emergency. If you need medication refills please notify your pharmacy one week in advance and they will send [name] a request.

## 2017-12-07 ENCOUNTER — Other Ambulatory Visit: Payer: Self-pay | Admitting: Internal Medicine

## 2017-12-07 ENCOUNTER — Encounter: Payer: Self-pay | Admitting: Internal Medicine

## 2017-12-07 MED ORDER — EMPAGLIFLOZIN 10 MG PO TABS: 10.0000 mg | ORAL_TABLET | Freq: Every day | ORAL | 3 refills | Status: DC

## 2017-12-07 MED ORDER — TRIAMCINOLONE ACETONIDE 0.5 % EX OINT: 1.0000 "application " | TOPICAL_OINTMENT | Freq: Two times a day (BID) | CUTANEOUS | 0 refills | Status: DC

## 2017-12-07 MED ORDER — DIPHENHYDRAMINE HCL 50 MG PO TABS: 50.0000 mg | ORAL_TABLET | Freq: Every evening | ORAL | 0 refills | Status: DC | PRN

## 2017-12-07 MED ORDER — AMOXICILLIN-POT CLAVULANATE 875-125 MG PO TABS: 1.0000 | ORAL_TABLET | Freq: Two times a day (BID) | ORAL | 0 refills | Status: DC

## 2017-12-07 MED FILL — AMOX-CLAV 875-125 MG TABLET: 875-125 | 7 days supply | Qty: 14 | Fill #0

## 2017-12-07 MED FILL — TRIAMCINOLONE 0.5% OINTMENT: 0.5 | 7 days supply | Qty: 30 | Fill #0

## 2017-12-07 NOTE — Telephone Encounter (Signed)
Pt stated [place] told her to call her doctor to send new rxs.

## 2017-12-07 NOTE — Assessment & Plan Note (Signed)
Patient is here for diabetes follow up. Her diabetes is not controlled. Last hemoglobin A1c 10.1 when last checked, follow up today 10.7. She is prescribed insulin NPH 30 units prior to breakfast and 20 units prior to dinner and sitagliptin 100 mg daily. She did not bring a blood glucose meter with her today, average blood sugar is between 160-200s. Recently she was hospitalized for a diabetic foot infection requiring incision and drainage and antibiotics, she notes that the foot has remained swollen after hospitalization. She was discharged on clindamycin however after sensitivities resulted she was asked to switch to bactrim. She had an allergic reaction to the bactrim, the skin on her chest and extremities became red, itchy and burning and she developed a blister on her left breast so she stopped taking it after two pills.  - foot exam performed today - the abscess was still present so Dr. Daryll Drown and I performed incision and drainage and sent the drainage for repeat cultures  - stop bactrim, she has a skin rash concerning for erythroderma, this will be added to her allergy list  - prescribed benadryl and triamcinolone ointment for the skin reaction and discussed return precautions  - transition to augmentin with 7 day course  - continue NPH 30 units in the morning 20 units in the evening  - start Jardiance 10 mg daily  - ASCVD 19%, currently prescribed moderate intensity pravastatin 40 mg daily  - due for eye exam, on last exam 3 years ago she did not have evidence of retinopathy, she asked that we place this referral at next office visit as we are addressing other preventative health measures today  - RTC ACC in 1 week

## 2017-12-07 NOTE — Telephone Encounter (Signed)
Pt was in office yesterday accidentally told Dr Hetty Ely the wrong pharmacy, all patients needs to go to the [facility] pharmacy; pt contact [phone]

## 2017-12-07 NOTE — Assessment & Plan Note (Signed)
Blood pressure is controlled today. Continue Amlodipine 10 mg daily and losartan 50 mg daily.

## 2017-12-07 NOTE — Telephone Encounter (Signed)
Thank you guys for letting me know about this! I have approved these request

## 2017-12-08 ENCOUNTER — Telehealth: Payer: Self-pay | Admitting: *Deleted

## 2017-12-08 NOTE — Telephone Encounter (Signed)
Thank you for letting me know. There must have been some confusion because Ms. Teska and I discussed this and she was fine with the Jardiance. Unfortunately I have been trying to contact her and her phone goes straight to voicemail which is not set up so can you let me know if you are able to reach her again?  If she would like to switch to a different class of medications this is fine but we definitely need to start something else in addition to the insulin that she is taking because her blood glucose is very poorly controlled. The other option is switching her insulin to a stronger formulation like 70/30.

## 2017-12-08 NOTE — Telephone Encounter (Signed)
Spoke w/ Mandy Mullen in op pharm, they have rec'd the scripts, they spoke to the pt last evening and she states she will not take the jardiance EVER. She stressed this. Triage has [facility]. pt has refused financial counselor appt stating she is awaiting medicare

## 2017-12-11 LAB — ANAEROBIC/AEROBIC/GRAM STAIN

## 2017-12-11 NOTE — Telephone Encounter (Signed)
Hi Dr. Hetty Ely, I will be here tomorrow and try to help at her appointment.  Thank you

## 2017-12-12 ENCOUNTER — Other Ambulatory Visit: Payer: Self-pay

## 2017-12-12 ENCOUNTER — Encounter: Payer: Self-pay | Admitting: Internal Medicine

## 2017-12-12 ENCOUNTER — Ambulatory Visit: Payer: Self-pay | Admitting: Pharmacist

## 2017-12-12 ENCOUNTER — Ambulatory Visit (INDEPENDENT_AMBULATORY_CARE_PROVIDER_SITE_OTHER): Payer: Self-pay | Admitting: Internal Medicine

## 2017-12-12 VITALS — BP 158/87 | HR 104 | Temp 98.1°F | Ht 63.0 in | Wt 150.4 lb

## 2017-12-12 DIAGNOSIS — Z8631 Personal history of diabetic foot ulcer: Secondary | ICD-10-CM

## 2017-12-12 DIAGNOSIS — I10 Essential (primary) hypertension: Secondary | ICD-10-CM

## 2017-12-12 DIAGNOSIS — E1165 Type 2 diabetes mellitus with hyperglycemia: Secondary | ICD-10-CM

## 2017-12-12 DIAGNOSIS — Z794 Long term (current) use of insulin: Secondary | ICD-10-CM

## 2017-12-12 DIAGNOSIS — E1159 Type 2 diabetes mellitus with other circulatory complications: Secondary | ICD-10-CM

## 2017-12-12 DIAGNOSIS — I152 Hypertension secondary to endocrine disorders: Secondary | ICD-10-CM

## 2017-12-12 DIAGNOSIS — R011 Cardiac murmur, unspecified: Secondary | ICD-10-CM

## 2017-12-12 DIAGNOSIS — E118 Type 2 diabetes mellitus with unspecified complications: Secondary | ICD-10-CM

## 2017-12-12 DIAGNOSIS — Z86018 Personal history of other benign neoplasm: Secondary | ICD-10-CM

## 2017-12-12 DIAGNOSIS — Z79899 Other long term (current) drug therapy: Secondary | ICD-10-CM

## 2017-12-12 DIAGNOSIS — Z887 Allergy status to serum and vaccine status: Secondary | ICD-10-CM

## 2017-12-12 DIAGNOSIS — Z1231 Encounter for screening mammogram for malignant neoplasm of breast: Secondary | ICD-10-CM

## 2017-12-12 DIAGNOSIS — L539 Erythematous condition, unspecified: Secondary | ICD-10-CM

## 2017-12-12 LAB — GLUCOSE, CAPILLARY: Glucose-Capillary: 170 mg/dL — ABNORMAL HIGH (ref 70–99)

## 2017-12-12 MED ORDER — INSULIN DEGLUDEC 100 UNIT/ML ~~LOC~~ SOLN: 10.0000 [IU] | Freq: Every day | SUBCUTANEOUS | 3 refills | Status: DC

## 2017-12-12 NOTE — Telephone Encounter (Signed)
Thank you Dr. Kim 

## 2017-12-12 NOTE — Patient Instructions (Addendum)
Thank you for coming to the clinic today. It was a pleasure to see you.   For your hypertension  Call me with the name of your blood pressure medication when you get home   Stop taking the 70/30 and switch to tresiba 10 units daily   You have had an allergy to janumet in the past   FOLLOW-UP INSTRUCTIONS When: 1 week with Dr. Hetty Ely or in the acute care clinic  For: CGM follow up  What to bring: all of your medication bottles   Please call the internal medicine center clinic if you have any questions or concerns, we may be able to help and keep you from a long and expensive emergency room wait. Our clinic and after hours phone number is [phone], the best time to call is Monday through Friday 9 am to 4 pm but there is always someone available 24/7 if you have an emergency. If you need medication refills please notify your pharmacy one week in advance and they will send [name] a request.

## 2017-12-12 NOTE — Progress Notes (Signed)
Patient was seen today in a co-visit with Dr. Hetty Ely

## 2017-12-12 NOTE — Progress Notes (Signed)
Patient was seen today in a co-visit with Dr. Hetty Ely for help with DM med management. She reports currently taking only NPH insulin 30 units in the morning and 20 units in the evening. She states she did not want to initiate empagliflozin due to BP adverse reaction, and is avoiding metformin because she believes it caused a severe rash. Discussed with patient the benefits of some of the non-insulin DM medications and she is open to trying different agents in the future.   She did not bring her BG meter today but reports morning fasting BG in the 50s-60s on 2-3 days per week. For now, patient was switched from BID insulin to 45 units once-daily, sample of Tyler Aas was provided (Lantus available on IM program formulary). Patient agreed to this change and is also interested in CGM, approved by PCP.  Documentation for Freestyle Libre Pro Continuous glucose monitoring Freestyle Libre Pro CGM sensor placed today. Patient was educated about wearing sensor, keeping food, activity and medication log and when to call office. Patient was educated about how to care for the sensor and not to have an MRI, CT or Diathermy while wearing the sensor. Follow up was arranged with the patient for 1 week.   Lot #: [id number] Serial #: [id number] Expiration Date: 05/17/18  Flossie Dibble, PharmD 12/12/2017 4:47 PM.

## 2017-12-12 NOTE — Progress Notes (Signed)
CC: follow up of diabetes and hypertension   HPI:  Mandy Mullen is a 67 y.o. with PMH as listed below who presents for follow up of diabetes and hypertension. Please see the assessment and plans for the status of the patient chronic medical problems.   Past Medical History:  Diagnosis Date  . History of blood transfusion 1991   "related to hysterectomy/left ovary OR" (11/28/2017)  . History of herpes genitalis   . Hyperlipidemia   . Hypertension   . MRSA (methicillin resistant Staphylococcus aureus)    Reccurent MRSA abscesses  . Type II diabetes mellitus ([place])    Review of Systems:  Refer to history of present illness and assessment and plans for pertinent review of systems, all others reviewed and negative  Physical Exam:  Vitals:   12/12/17 1608  BP: (!) 158/87  Pulse: (!) 104  Temp: 98.1 F (36.7 C)  TempSrc: Oral  SpO2: 100%  Weight: 150 lb 6.4 oz (68.2 kg)  Height: '5\' 3"'  (1.6 m)   General: well appearing, no acute distress  Cardiac: 2/6 systolic loudest over RUSB  Pulm: normal work of breathing, lungs are clear to auscultation  Skin: the diffuse erythema over the extremities and chest has turned to bruising, there is evidence that more bullae developed and healed - there is peeling skin over the neck, around the lips and eyes and on the breast. This is an example on the posterior aspect of the left thigh.      Assessment & Plan:   Type 2 Diabetes  Erythroderma  Diabetic foot infection  Patient is here for diabetes follow up. The diabetes is uncontrolled, last hemoglobin A1c last week showed an A1c of 10.7. A CGM was placed today. At her office visit last week we prescribed jardiance, she confused this with janumet when she arrived at the pharmacy and declined picking it up because she had a reaction in the past. She has had severe medication reactions in the past:  Metformin caused a diffuse erythematous blistering rash all of a sudden after years of  taking the medication without issue Janumet caused hypertension, without rash  Currently she is prescribed 70/30 30 units in the morning and 20 units in the evening. She does not have her meter with her today but explained that she has been having readings in the 50s in the mornings. She met with Dr. [name] today and we have transitioned her to degludec 45 units daily and placed a CGM today.  On foot exam today the area with prior diabetic foot abscess has shown improvement, there is no longer induration and the skin overlying this abscess is healing nicely. The erythroderma has began to heal as well, the areas of prior erythema have turned to bruising, she has more open blisters evident today and says that the skin around her mouth and eyelids pealed after the last office visit. She continues to have pain over the blistered areas and is using triamcinolone over the erythema.  - transition 70/30 30 units to degludec 45 units daily  - ASCVD 19%, continue moderate intensity pravastatin 40 mg daily  - follow up in one week for CGM check - referral to podiatry and ophthalmology will be a priority as soon as she can afford these   Hypertension  Blood pressure is elevated today and remained persistently elevated on recheck. Current medications include amlodipine 10 mg and losartan 50 mg daily. Ideally we would add on an additional medication today however given  the history of severe adverse reaction to medications in the past and change in insulin formulation I am hesitant. I will increase her losartan and at follow up if her blood pressure isnt controlled we could consider adding on HCTZ in combination.  - continue amlodipine 10 mg daily  - increase losartan 50 mg to 100 mg daily, Mandy Mullen didn't have her medication bottles at the visit and needed to call the front desk afterward to notify us of the medications she is taking. I have tried to call and notify her of the medication change but her voicemail is  not set up. 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  Patient discussed with Dr. RRebeca Alert

## 2017-12-13 ENCOUNTER — Encounter: Payer: Self-pay | Admitting: Internal Medicine

## 2017-12-13 MED ORDER — LOSARTAN POTASSIUM 100 MG PO TABS: 100.0000 mg | ORAL_TABLET | Freq: Every day | ORAL | 3 refills | Status: DC

## 2017-12-13 NOTE — Progress Notes (Signed)
Internal Medicine Clinic Attending  I saw and evaluated the patient.  I personally confirmed the key portions of the history and exam documented by Dr. Hetty Ely and I reviewed pertinent patient test results.  The assessment, diagnosis, and plan were formulated together and I agree with the documentation in the resident's note.      I was present for procedure and assisted in wound culture collection.

## 2017-12-13 NOTE — Assessment & Plan Note (Signed)
Blood pressure is elevated today and remained persistently elevated on recheck. Current medications include amlodipine 10 mg and losartan 50 mg daily. Ideally we would add on an additional medication today however given the history of severe adverse reaction to medications in the past and change in insulin formulation I am hesitant. I will increase her losartan and at follow up if her blood pressure isnt controlled we could consider adding on HCTZ in combination.  - continue amlodipine 10 mg daily  - increase losartan 50 mg to 100 mg daily, Ms. Quain didn't have her medication bottles at the visit and needed to call the front desk afterward to notify us of the medications she is taking. I have tried to call and notify her of the medication change but her voicemail is not set up. I have sent this prescription to the pharmacy and will continue to try and reach her next week

## 2017-12-19 ENCOUNTER — Ambulatory Visit: Payer: Self-pay | Admitting: Pharmacist

## 2017-12-19 ENCOUNTER — Ambulatory Visit: Payer: Self-pay

## 2017-12-19 DIAGNOSIS — E1165 Type 2 diabetes mellitus with hyperglycemia: Secondary | ICD-10-CM

## 2017-12-19 MED ORDER — INSULIN DEGLUDEC-LIRAGLUTIDE 100-3.6 UNIT-MG/ML ~~LOC~~ SOPN: 16.0000 [IU] | PEN_INJECTOR | Freq: Every day | SUBCUTANEOUS | Status: DC

## 2017-12-19 NOTE — Progress Notes (Signed)
S: Mandy Mullen is a 67 y.o. female reports to clinical pharmacist appointment for diabetes management per PCP.  Allergies  Allergen Reactions  . Lisinopril Itching, Swelling and Rash    Angioedema and diffuse body rash  . Bactrim [Sulfamethoxazole-Trimethoprim]     Erythroderma like reaction over 80% of her body   . Eggs Or Egg-Derived Products     REACTION: Nausea. Chills/fevers x 6 months (after a flu shot in the 1990s).  . Shellfish Allergy    Medication Sig  amLODipine (NORVASC) 10 MG tablet Take 1 tablet (10 mg total) by mouth daily. IM PROGRAM  glucose blood (CONTOUR NEXT TEST) test strip Test blood sugar 3 times daily. IM program  Insulin Degludec-Liraglutide (XULTOPHY) 100-3.6 UNIT-MG/ML SOPN Inject 16 Units into the skin daily.  Insulin Syringe-Needle U-100 (INSULIN SYRINGE .3CC/31GX5/16") 31G X 5/16" 0.3 ML MISC 1 Dose by Does not apply route 2 (two) times daily. IM program  losartan (COZAAR) 100 MG tablet Take 1 tablet (100 mg total) by mouth daily.  MICROLET LANCETS MISC 1 Device by Does not apply route 3 (three) times daily.  Multiple Vitamin (MULTIVITAMIN) tablet Take 1 tablet by mouth daily.  naproxen sodium (ALEVE) 220 MG tablet Take 440 mg by mouth daily as needed (foot pain).   ONE TOUCH LANCETS MISC Use to check blood sugars  pravastatin (PRAVACHOL) 40 MG tablet Take 1 tablet (40 mg total) by mouth daily.   Past Medical History:  Diagnosis Date  . History of blood transfusion 1991   "related to hysterectomy/left ovary OR" (11/28/2017)  . History of herpes genitalis   . Hyperlipidemia   . Hypertension   . MRSA (methicillin resistant Staphylococcus aureus)    Reccurent MRSA abscesses  . Type II diabetes mellitus ([place])    Social History   Socioeconomic History  . Marital status: Single    Spouse name: Not on file  . Number of children: Not on file  . Years of education: Not on file  . Highest education level: Not on file  Occupational History  . Not  on file  Social Needs  . Financial resource strain: Not on file  . Food insecurity:    Worry: Not on file    Inability: Not on file  . Transportation needs:    Medical: Not on file    Non-medical: Not on file  Tobacco Use  . Smoking status: Never Smoker  . Smokeless tobacco: Never Used  Substance and Sexual Activity  . Alcohol use: Not Currently    Comment: 11/28/2017 "last drink was in 2006; never had problem w/alcohol".  . Drug use: Never  . Sexual activity: Not Currently  Lifestyle  . Physical activity:    Days per week: Not on file    Minutes per session: Not on file  . Stress: Not on file  Relationships  . Social connections:    Talks on phone: Not on file    Gets together: Not on file    Attends religious service: Not on file    Active member of club or organization: Not on file    Attends meetings of clubs or organizations: Not on file    Relationship status: Not on file  Other Topics Concern  . Not on file  Social History Narrative   Currently in school, studying nursing, will be done in 2 years   Single   Lives with son in [place]   Regular Exercise- yes- trying to go to [facility]  Financial assistance application initiated.  Patient needs to submit further paperwork to complete per Bonna Gains 07/29/09.   Financial assistance approved for 100% discount at [facility] and has [facility] card per [employer] 07/06/09.   Family History  Problem Relation Age of Onset  . Coronary artery disease Father   . Diabetes Sister   . Stroke Sister    O: Component Value Date/Time   CHOL 188 06/25/2013 1652   HDL 55 06/25/2013 1652   LDLCALC 107 (H) 06/25/2013 1652   TRIG 130 06/25/2013 1652   GLUCOSE 130 (H) 11/29/2017 0322   HGBA1C 10.7 (A) 12/06/2017 1418   HGBA1C 9.9 12/21/2009 1347   NA 136 11/29/2017 0322   NA 140 07/26/2016 1625   K 3.9 11/29/2017 0322   CL 106 11/29/2017 0322   CO2 24 11/29/2017 0322   BUN 12 11/29/2017 0322   BUN 25 07/26/2016  1625   CREATININE 1.09 (H) 11/29/2017 0322   CREATININE 1.12 (H) 04/09/2012 1615   CALCIUM 8.7 (L) 11/29/2017 0322   GFRNONAA 52 (L) 11/29/2017 0322   GFRNONAA 65 09/27/2011 1318   GFRAA 60 (L) 11/29/2017 0322   GFRAA 75 09/27/2011 1318   AST 13 04/09/2012 1615   ALT 18 04/09/2012 1615   WBC 12.7 (H) 11/30/2017 0646   HGB 10.0 (L) 11/30/2017 0646   HCT 31.2 (L) 11/30/2017 0646   PLT 367 11/30/2017 0646   Ht Readings from Last 2 Encounters:  12/12/17 5\' 3"  (1.6 m)  12/06/17 5\' 3"  (1.6 m)   Wt Readings from Last 2 Encounters:  12/12/17 150 lb 6.4 oz (68.2 kg)  12/06/17 167 lb (75.8 kg)   There is no height or weight on file to calculate BMI. BP Readings from Last 3 Encounters:  12/12/17 (!) 158/87  12/06/17 138/65  11/30/17 124/63    A/P: Patient reports she is currently taking 45 units of insulin degludec. [employer] and Reviewed  Freestyle Libre sensor placed 12/12/17  Week #1 of monitoring  7-day BG average 202, within target range 24% of the time  Above 180 mg/dL 57% of the time, highest around nighttime  Below 70 mg/dL 19% of the time, lowest around morning  Below 54 mg/dL 15% of the time  Coefficient of variation 58.7%  Standard deviation: 117.4 mg/dL  Findings/Recommendations Patient is experiencing significant hypoglycemia, which she confirmed against her home BG results. This morning she reports having BG in the 50s which displayed on the CGM report. She states she feels "out of it" when these episodes occur, checks her BG, and eats a few pieces of candy if BG low. Provided further education and information handout on hypoglycemia management.  Switched patient from [place] to [place] to potentially reduce incidence of hypoglycemia. Patient does not have insurance coverage so will need to refer to Health Department for ongoing access. She stopped taking metformin due to adverse effect of rash, however, this may have been due to  sulfamethoxazole-trimethoprim. Also provided dietary education and advised patient to continue monitoring BG at home. Contact clinic if BG >300 and/or symptoms or questions arise. Follow up in 1 week. Will also refer patient to dietician for further DM support.  The patient verbalized understanding of information provided by repeating back concepts discussed.

## 2017-12-21 NOTE — Addendum Note (Signed)
Addended by: Meryl Dare on: 12/21/2017 11:55 AM   Modules accepted: Orders

## 2017-12-25 NOTE — Progress Notes (Signed)
Internal Medicine Clinic Attending  Case discussed with Dr. Blum at the time of the visit.  We reviewed the resident's history and exam and pertinent patient test results.  I agree with the assessment, diagnosis, and plan of care documented in the resident's note.  Alexander Raines, M.D., Ph.D.  

## 2018-01-02 ENCOUNTER — Ambulatory Visit (INDEPENDENT_AMBULATORY_CARE_PROVIDER_SITE_OTHER): Payer: Self-pay | Admitting: Internal Medicine

## 2018-01-02 ENCOUNTER — Encounter: Payer: Self-pay | Admitting: Internal Medicine

## 2018-01-02 ENCOUNTER — Other Ambulatory Visit: Payer: Self-pay

## 2018-01-02 DIAGNOSIS — I10 Essential (primary) hypertension: Secondary | ICD-10-CM

## 2018-01-02 DIAGNOSIS — E785 Hyperlipidemia, unspecified: Secondary | ICD-10-CM

## 2018-01-02 DIAGNOSIS — Z8631 Personal history of diabetic foot ulcer: Secondary | ICD-10-CM

## 2018-01-02 DIAGNOSIS — E118 Type 2 diabetes mellitus with unspecified complications: Secondary | ICD-10-CM

## 2018-01-02 DIAGNOSIS — Z794 Long term (current) use of insulin: Secondary | ICD-10-CM

## 2018-01-02 DIAGNOSIS — E1165 Type 2 diabetes mellitus with hyperglycemia: Secondary | ICD-10-CM

## 2018-01-02 LAB — GLUCOSE, CAPILLARY: Glucose-Capillary: 373 mg/dL — ABNORMAL HIGH (ref 70–99)

## 2018-01-02 MED ORDER — INSULIN DEGLUDEC-LIRAGLUTIDE 100-3.6 UNIT-MG/ML ~~LOC~~ SOPN: 18.0000 [IU] | PEN_INJECTOR | Freq: Every day | SUBCUTANEOUS | Status: DC

## 2018-01-02 NOTE — Patient Instructions (Addendum)
Thank you for coming to the clinic today. It was a pleasure to see you.   For your diabetes - increase your dose of xultophy to 18 units daily. Please call the office when you get home and let us know the name on your pen. Please bring your medications with you to your next office visit   FOLLOW-UP INSTRUCTIONS When: 2 weeks with Dr. Hetty Ely or in the acute care clinic  For: Follow up of your diabetes, check on the foot ulcer What to bring: all of your medication bottles   Please call the internal medicine center clinic if you have any questions or concerns, we may be able to help and keep you from a long and expensive emergency room wait. Our clinic and after hours phone number is [phone], the best time to call is Monday through Friday 9 am to 4 pm but there is always someone available 24/7 if you have an emergency. If you need medication refills please notify your pharmacy one week in advance and they will send [name] a request.

## 2018-01-03 ENCOUNTER — Other Ambulatory Visit: Payer: Self-pay

## 2018-01-03 ENCOUNTER — Other Ambulatory Visit: Payer: Self-pay | Admitting: *Deleted

## 2018-01-03 DIAGNOSIS — E1165 Type 2 diabetes mellitus with hyperglycemia: Secondary | ICD-10-CM

## 2018-01-03 NOTE — Telephone Encounter (Signed)
Requesting to speak with a nurse about getting a refill on xultophy. Please call pt back.

## 2018-01-03 NOTE — Telephone Encounter (Signed)
rtc to pt, did ring when called twice, will try again, sent request for refill in new encounter

## 2018-01-04 ENCOUNTER — Encounter: Payer: Self-pay | Admitting: Internal Medicine

## 2018-01-04 MED ORDER — INSULIN DEGLUDEC-LIRAGLUTIDE 100-3.6 UNIT-MG/ML ~~LOC~~ SOPN: 18.0000 [IU] | PEN_INJECTOR | Freq: Every day | SUBCUTANEOUS | 11 refills | Status: DC

## 2018-01-04 NOTE — Progress Notes (Signed)
Hi Dr. Hetty Ely, the best way to access Xultophy for her would be [facility] health department MAP program (does not need orange card to use this service). I sent a prescription and tried calling patient, but could not reach her.  I have been placing orders for Xultophy for the past few weeks, but none have come in. We may need to switch to basal insulin for the meantime. They do have Lantus under [place] IM program. Although not ideal, they also have [place]. I will keep trying.   Thank you

## 2018-01-04 NOTE — Progress Notes (Addendum)
CC: follow up of diabetes   HPI:  MandyMandy Mullen is a 67 y.o. with PMH HTN, HLD, uncontrolled Type II diabetes complicated by a foot ulcer who presents for follow up of diabetes and hypertension. Please see the assessment and plans for the status of the patient chronic medical problems.   Past Medical History:  Diagnosis Date  . History of blood transfusion 1991   "related to hysterectomy/left ovary OR" (11/28/2017)  . History of herpes genitalis   . Hyperlipidemia   . Hypertension   . MRSA (methicillin resistant Staphylococcus aureus)    Reccurent MRSA abscesses  . Type II diabetes mellitus ([place])    Review of Systems:  Refer to history of present illness and assessment and plans for pertinent review of systems, all others reviewed and negative  Physical Exam:  Vitals:   01/02/18 1608  BP: 138/67  Pulse: 87  Temp: 98.1 F (36.7 C)  TempSrc: Oral  SpO2: 100%  Weight: 160 lb (72.6 kg)  Height: 5\' 3"  (1.6 m)   General: well appearing, no acute distress  Ext: no longer with ortho boot, plantar surface of the right foot no longer has signs of an ulcer, the prior wound has healed completely   Assessment & Plan:   Type 2 Diabetes Mellitus  Patient presents for CGM check today. Mandy Mullen wore the CGM for 14 days. The average reading was 266, % time in target was 13, % time below target was 9, and % time above target was. 78. Meter averages included prior weeks readings, when reviewing the graphical representation of her blood glucose it is clear that she has actually had no hypoglycemia since switching to xultophy and is consistently above 180. She also mentions that she has been feeling much better since the change with less symptoms of hypoglycemia. Intervention will be to increase xultophy to 18 units daily. I will attempt to call next week and increase the dose over the phone if she does not have return of the hypoglycemia at this increased dose. I am hopeful that we  can continue managing with slow increases over the phone but wary given how unreliable her phone is. She should be scheduled for follow up in two weeks, she is at risk for being lost to follow up.  - increase xultophy to 18 units daily  - she also requested disability paperwork be completed for the time that she was hospitalized in November up until 12/26. She provided the paperwork today but is unsure of the fax number so she will call and leave that with our front desk at a later time.   Addendum 12/27: Dr. [name] notified me that the most affordable way to obtain the xultophy would be through the [facility] health department MAP program. She attempted to communicate this to Mandy Mullen but was unable to reach her. Mandy Mullen called [name] 12/18 requesting that the xultophy be sent to the [facility] cone outpatient pharmacy and insisted on this despite our triage explaining that the medication would not be affordable there. I called Mandy Mullen today to be sure that she was able to pick up the medication. She explained that she was concerned she would not be able to pick up the medication because she was aware that it would be a long process to set up through [place]. She was not able to pick up xultophy at [facility] cone because it cost over $500. She has switched back to taking the insulin 70/30  with 30 units in the morning and 20 units in the evening. I encouraged her to call the health department and start the application process for MAP.   See Encounters Tab for problem based charting.  Patient discussed with Dr. Eppie Gibson

## 2018-01-04 NOTE — Assessment & Plan Note (Signed)
Patient presents for CGM check today. Mandy Mullen wore the CGM for 14 days. The average reading was 266, % time in target was 13, % time below target was 9, and % time above target was. 78. Meter averages included prior weeks readings, when reviewing the graphical representation of her blood glucose it is clear that she has actually had no hypoglycemia since switching to xultophy and is consistently above 180. She also mentions that she has been feeling much better since the change with less symptoms of hypoglycemia. Intervention will be to increase xultophy to 18 units daily. I will attempt to call next week and increase the dose over the phone if she does not have return of the hypoglycemia at this increased dose. I am hopeful that we can continue managing with slow increases over the phone but wary given how unreliable her phone is. She should be scheduled for follow up in two weeks, she is at risk for being lost to follow up.  - increase xultophy to 18 units daily

## 2018-01-05 NOTE — Progress Notes (Signed)
Patient ID: Mandy Mullen, female   DOB: 05/05/1950, 67 y.o.   MRN: [id number]  Case discussed with Dr. Hetty Ely at the time of the visit.  We reviewed the resident's history and exam and pertinent patient test results.  I agree with the assessment, diagnosis and plan of care documented in the resident's note.

## 2018-01-05 NOTE — Addendum Note (Signed)
Addended by: Oval Linsey D on: 01/05/2018 09:18 AM   Modules accepted: Level of Service

## 2018-01-08 MED ORDER — INSULIN DEGLUDEC-LIRAGLUTIDE 100-3.6 UNIT-MG/ML ~~LOC~~ SOPN: 18.0000 [IU] | PEN_INJECTOR | Freq: Every day | SUBCUTANEOUS | 11 refills | Status: DC

## 2018-01-08 NOTE — Telephone Encounter (Addendum)
Yes Dr. [name] sent the medication to the Health Department because the MAP program will be the most/only affordable way to obtain this medication. This is not on the [place] $4 list and will not be affordable through our pharmacy. Dr. [name] attempted to contact Ms. Nuzzo to communicate this but was unable to reach her. I just tried to reach Ms. Gahan by phone x2  but reached the voicemail, please communicate this with her when she calls back.  In regard to the short term disability paperwork, this is complete and ready to send but we need the fax number. Ms. Steppe was going to call to let us know what number to send this to. The paperwork will be sitting in my mailbox if I am not here when she obtains the information.

## 2018-01-08 NOTE — Telephone Encounter (Signed)
Okay I have sent the prescription to the [facility] cone pharmacy

## 2018-01-08 NOTE — Telephone Encounter (Signed)
Informed pt her medication would be cheaper at the [facility] and it's not on $4 list at [place] but she still wants it sent to [place] [place]. Also fax # to send disability paperwork is  [phone].

## 2018-01-08 NOTE — Telephone Encounter (Addendum)
Xultophy was refilled on 12/19 but was sent to [facility] instead of [facility]. [facility] pharmacy removed from chart per pt's request.  Please re-send.  Also pt is asking about Short term disability forms - if completed. I will ask her doctor.

## 2018-01-08 NOTE — Telephone Encounter (Signed)
Called pt - voice mailbox has not been set up; unable to leave message.

## 2018-01-16 MED ORDER — INSULIN DEGLUDEC-LIRAGLUTIDE 100-3.6 UNIT-MG/ML ~~LOC~~ SOPN: 18.0000 [IU] | PEN_INJECTOR | Freq: Every day | SUBCUTANEOUS | 11 refills | Status: DC

## 2018-01-16 NOTE — Addendum Note (Signed)
Addended by: Forde Dandy on: 01/16/2018 12:59 PM   Modules accepted: Orders

## 2018-01-16 NOTE — Progress Notes (Signed)
No problem, Dr. Hetty Ely! Will work with patient

## 2018-01-19 ENCOUNTER — Telehealth: Payer: Self-pay | Admitting: *Deleted

## 2018-01-19 NOTE — Telephone Encounter (Signed)
Fax from [place] is not on IM formulary. The closest alternatives are: [place] and [facility]. Please send new rx. Thanks

## 2018-01-19 NOTE — Telephone Encounter (Signed)
[facility] pharmacy informed.

## 2018-01-19 NOTE — Telephone Encounter (Addendum)
Thank you for letting me know, I spoke to Mandy Mullen about this and she was planning to go to the health department once she found out the cost for xultophy through [place].    Hi Dr. [name]. Mandy Mullen is open to going to the health department to pick up the xultophy as you had suggested. Will they need an application of some sort for this? She was unsure of how to proceed.

## 2018-01-22 MED FILL — PRAVASTATIN NA 40 MG TAB: 40 | 30 days supply | Qty: 30 | Fill #5

## 2018-01-22 MED FILL — LOSARTAN POTASSIUM 50 MG TA: 50 | 30 days supply | Qty: 30 | Fill #5

## 2018-01-22 MED FILL — AMLODIPINE BESYLATE 10 MG T: 10 | 30 days supply | Qty: 30 | Fill #5

## 2018-01-23 ENCOUNTER — Ambulatory Visit: Payer: Self-pay | Admitting: Pharmacist

## 2018-01-31 ENCOUNTER — Encounter: Payer: Self-pay | Admitting: *Deleted

## 2018-02-19 NOTE — Telephone Encounter (Signed)
Hi Dr. [name], Do you know if Ms. Twitty was approved to receive the xultophy?

## 2018-02-20 NOTE — Telephone Encounter (Signed)
Hi Dr. Hetty Ely, I called the health department and patient has not followed up. I can giver her more samples, but unable to reach her by phone. I can keep trying and try to co-visit at her March appointment.  Thank you

## 2018-02-20 NOTE — Telephone Encounter (Signed)
Okay Dr. [name], thank you for letting me know and thank you for trying to reach her

## 2018-03-02 ENCOUNTER — Telehealth: Payer: Self-pay | Admitting: Pharmacist

## 2018-03-02 NOTE — Telephone Encounter (Signed)
Unable to meet with patient March 5th for med help. Tried calling patient to re-schedule, no answer x 2, unable to leave voicemail.

## 2018-03-05 MED FILL — LOSARTAN POTASSIUM 50 MG TA: 50 | 30 days supply | Qty: 30 | Fill #6 | Status: TO

## 2018-03-05 MED FILL — AMLODIPINE BESYLATE 10 MG T: 10 | 30 days supply | Qty: 30 | Fill #6 | Status: TO

## 2018-03-05 MED FILL — PRAVASTATIN NA 40 MG TAB: 40 | 30 days supply | Qty: 30 | Fill #6 | Status: TO

## 2018-03-12 ENCOUNTER — Encounter: Payer: Self-pay | Admitting: *Deleted

## 2018-03-22 ENCOUNTER — Encounter: Payer: Self-pay | Admitting: Internal Medicine

## 2018-03-22 ENCOUNTER — Ambulatory Visit: Payer: Self-pay | Admitting: Pharmacist

## 2018-03-28 NOTE — Progress Notes (Signed)
Subjective:  HPI: Mandy Mullen is a 68 y.o. female who presents for f/u HTN and DM  This is actually her first visit with me that been reassigned as her PCP. Of note I did have some conflicting information that she has Medicare/or no insurance.  I did discuss with her about getting labs and she wanted to defer because of lack of insurance.  She seemed a little bit unaware of Medicare and reports she did not have Medicare card.  I discussed with her about calling Medicare office and making sure that she is enrolled on proper benefits.  I also did discuss with our front office so they can assist her however they reported that they have had previous conversations with her about this she has been enrolled in Medicare part A but chooses not to enroll in Medicare part B because she will lose part of her Social Security.  Please see Assessment and Plan below for the status of her chronic medical problems.  Review of Systems: Review of Systems  Constitutional: Negative for fever and weight loss.  Respiratory: Negative for cough.   Gastrointestinal: Negative for abdominal pain.  Genitourinary: Negative for dysuria.  Musculoskeletal: Negative for myalgias.    Objective:  Physical Exam: Vitals:   03/29/18 1032  BP: (!) 157/74  Pulse: 94  Temp: 98.3 F (36.8 C)  TempSrc: Oral  SpO2: 99%  Weight: 175 lb 8 oz (79.6 kg)  Height: 5\' 3"  (1.6 m)   Body mass index is 31.09 kg/m. Physical Exam Vitals signs and nursing note reviewed.  Cardiovascular:     Rate and Rhythm: Normal rate.  Pulmonary:     Effort: Pulmonary effort is normal.     Breath sounds: Normal breath sounds.  Musculoskeletal:     Comments: right foot plantar aspect, small <1cm nodule under 4th metatarsal head, no open wound, no fluid collection, no erythema, good cap refill <2s    Assessment & Plan:  Hypertension associated with diabetes (HCC) HPI: Patient has been taking losartan 100 mg daily she has no complaints  with this medication.  She also reports taking amlodipine 10 mg daily.  Assessment essential hypertension above goal  Plan Provided sample of hydrochlorothiazide 25 mg number 14 pills to start taking.  I will replace her losartan 100 mg daily with a combination pill of losartan-HCTZ 100-25 to start when her sample runs out.  Uncontrolled type 2 diabetes mellitus (HCC) HPI: In my review she has had uncontrolled diabetes for quite a long time.  She has a history of a diabetic foot ulcer that has now healed.  She does not have history of diabetic retinopathy however her last t retinal evaluation was 4 years ago.  She does have some chronic kidney disease this is presumed to be due to uncontrolled diabetes/hypertension. She reports she was previously taking Xultophy 18 units daily.  She reports this resulted in an A1c of 7.  At the time of my evaluation of her epic was found and I have limited access to labs and my thorough review of her chart I do not see any recent A1c is that low.  However she reports the cost of this medication is $1100 for a 4-day supply not quite sure this is completely accurate but it is still unaffordable to her without any insurance please see my additional note of her being eligible for Medicare but declining. In the interim she reports that she has gone back to taking Novolin 70/30 30 units twice daily.  She did not bring her meter.  Assessment uncontrolled type 2 diabetes with CKD stage 2  Plan Obviously needs much better control, this will be very difficult given lack of insurance/financial constraints.  I provided her still tophi to [facility] samples which should last 30 days at 18 units a day.  I wanted to have her back in about 4 weeks so we can discuss this further I was hoping that we can get her insurance issues worked out however after talking with the front office I am not quite sure that she actually is interested in obtaining coverage. Given that the EMR failed is  unable to look into why she is not on metformin.  Ideally this would be a good medication to start I will need to recheck her renal function before starting.  CKD stage 2 due to type 2 diabetes mellitus ([place]) Had plan to recheck renal function patient declined today due to financial reasons.  We will plan to obtain at follow-up.  Hyperlipidemia Patient reports use of pravastatin 40 mg daily had plan to recheck lipid panel however patient declined today we will check at next visit   Medications Ordered Meds ordered this encounter  Medications  . hydrochlorothiazide (HYDRODIURIL) 25 MG tablet    Sig: Take 1 tablet (25 mg total) by mouth daily.    Dispense:  14 tablet    Refill:  0  . losartan-hydrochlorothiazide (HYZAAR) 100-25 MG tablet    Sig: Take 1 tablet by mouth daily.    Dispense:  30 tablet    Refill:  11    IM Program   Other Orders Orders Placed This Encounter  Procedures  . Glucose, capillary  . POC Hbg A1C   Follow Up: Return 4-6 weeks.

## 2018-03-29 ENCOUNTER — Encounter: Payer: Self-pay | Admitting: Internal Medicine

## 2018-03-29 ENCOUNTER — Ambulatory Visit (INDEPENDENT_AMBULATORY_CARE_PROVIDER_SITE_OTHER): Payer: Self-pay | Admitting: Internal Medicine

## 2018-03-29 VITALS — BP 157/74 | HR 94 | Temp 98.3°F | Ht 63.0 in | Wt 175.5 lb

## 2018-03-29 DIAGNOSIS — E1165 Type 2 diabetes mellitus with hyperglycemia: Secondary | ICD-10-CM

## 2018-03-29 DIAGNOSIS — E118 Type 2 diabetes mellitus with unspecified complications: Secondary | ICD-10-CM

## 2018-03-29 DIAGNOSIS — Z794 Long term (current) use of insulin: Secondary | ICD-10-CM

## 2018-03-29 DIAGNOSIS — E785 Hyperlipidemia, unspecified: Secondary | ICD-10-CM

## 2018-03-29 DIAGNOSIS — E1159 Type 2 diabetes mellitus with other circulatory complications: Secondary | ICD-10-CM

## 2018-03-29 DIAGNOSIS — I10 Essential (primary) hypertension: Principal | ICD-10-CM

## 2018-03-29 DIAGNOSIS — I129 Hypertensive chronic kidney disease with stage 1 through stage 4 chronic kidney disease, or unspecified chronic kidney disease: Secondary | ICD-10-CM

## 2018-03-29 DIAGNOSIS — E1122 Type 2 diabetes mellitus with diabetic chronic kidney disease: Secondary | ICD-10-CM

## 2018-03-29 DIAGNOSIS — I152 Hypertension secondary to endocrine disorders: Secondary | ICD-10-CM

## 2018-03-29 DIAGNOSIS — Z79899 Other long term (current) drug therapy: Secondary | ICD-10-CM

## 2018-03-29 DIAGNOSIS — N182 Chronic kidney disease, stage 2 (mild): Secondary | ICD-10-CM

## 2018-03-29 DIAGNOSIS — Z8631 Personal history of diabetic foot ulcer: Secondary | ICD-10-CM

## 2018-03-29 LAB — POCT GLYCOSYLATED HEMOGLOBIN (HGB A1C): Hemoglobin A1C: 11.8 % — AB (ref 4.0–5.6)

## 2018-03-29 LAB — GLUCOSE, CAPILLARY: Glucose-Capillary: 159 mg/dL — ABNORMAL HIGH (ref 70–99)

## 2018-03-30 MED ORDER — HYDROCHLOROTHIAZIDE 25 MG PO TABS: 25.0000 mg | ORAL_TABLET | Freq: Every day | ORAL | 0 refills | Status: DC

## 2018-03-30 MED ORDER — LOSARTAN POTASSIUM-HCTZ 100-25 MG PO TABS: 1.0000 | ORAL_TABLET | Freq: Every day | ORAL | 11 refills | Status: DC

## 2018-03-30 MED FILL — LOSARTAN-HCTZ 100-25 MG TAB: 100-25 | 30 days supply | Qty: 30 | Fill #0 | Status: TO

## 2018-03-30 NOTE — Assessment & Plan Note (Signed)
Had plan to recheck renal function patient declined today due to financial reasons.  We will plan to obtain at follow-up.

## 2018-03-30 NOTE — Assessment & Plan Note (Signed)
HPI: Patient has been taking losartan 100 mg daily she has no complaints with this medication.  She also reports taking amlodipine 10 mg daily.  Assessment essential hypertension above goal  Plan Provided sample of hydrochlorothiazide 25 mg number 14 pills to start taking.  I will replace her losartan 100 mg daily with a combination pill of losartan-HCTZ 100-25 to start when her sample runs out.

## 2018-03-30 NOTE — Assessment & Plan Note (Addendum)
HPI: In my review she has had uncontrolled diabetes for quite a long time.  She has a history of a diabetic foot ulcer that has now healed.  She does not have history of diabetic retinopathy however her last t retinal evaluation was 4 years ago.  She does have some chronic kidney disease this is presumed to be due to uncontrolled diabetes/hypertension. She reports she was previously taking Xultophy 18 units daily.  She reports this resulted in an A1c of 7.  At the time of my evaluation of her epic was found and I have limited access to labs and my thorough review of her chart I do not see any recent A1c is that low.  However she reports the cost of this medication is $1100 for a 4-day supply not quite sure this is completely accurate but it is still unaffordable to her without any insurance please see my additional note of her being eligible for Medicare but declining. In the interim she reports that she has gone back to taking Novolin 70/30 30 units twice daily.  She did not bring her meter.  Assessment uncontrolled type 2 diabetes with CKD stage 2  Plan Obviously needs much better control, this will be very difficult given lack of insurance/financial constraints.  I provided her still tophi to [facility] samples which should last 30 days at 18 units a day.  I wanted to have her back in about 4 weeks so we can discuss this further I was hoping that we can get her insurance issues worked out however after talking with the front office I am not quite sure that she actually is interested in obtaining coverage. Given that the EMR failed is unable to look into why she is not on metformin.  Ideally this would be a good medication to start I will need to recheck her renal function before starting.

## 2018-03-30 NOTE — Assessment & Plan Note (Signed)
Patient reports use of pravastatin 40 mg daily had plan to recheck lipid panel however patient declined today we will check at next visit

## 2018-04-17 ENCOUNTER — Telehealth: Payer: Self-pay | Admitting: *Deleted

## 2018-04-17 NOTE — Telephone Encounter (Signed)
PATIENT STATES SHE WILL FOLLOW UP WITH [facility] FOR PRICE OF EXAM. HER INSURANCE EXPIRED AND WOULD HAVE TO GO NEAR [place] FOR CLOSET EYE DR.  PATIENT WILL CALL WHEN SHE GETS HER EYES DONE.

## 2018-04-20 MED FILL — AMLODIPINE BESYLATE 10 MG T: 10 | 30 days supply | Qty: 30 | Fill #0

## 2018-04-20 MED FILL — PRAVASTATIN NA 40 MG TAB: 40 | 30 days supply | Qty: 30 | Fill #0

## 2018-05-24 ENCOUNTER — Encounter: Payer: Self-pay | Admitting: Internal Medicine

## 2018-06-01 MED FILL — LOSARTAN-HCTZ 100-25 MG TAB: 100-25 | 30 days supply | Qty: 30 | Fill #0

## 2018-06-01 MED FILL — PRAVASTATIN NA 40 MG TAB: 40 | 30 days supply | Qty: 30 | Fill #1

## 2018-06-01 MED FILL — AMLODIPINE BESYLATE 10 MG T: 10 | 30 days supply | Qty: 30 | Fill #1

## 2018-07-17 ENCOUNTER — Other Ambulatory Visit: Payer: Self-pay | Admitting: *Deleted

## 2018-07-17 DIAGNOSIS — E7849 Other hyperlipidemia: Secondary | ICD-10-CM

## 2018-07-17 DIAGNOSIS — I10 Essential (primary) hypertension: Secondary | ICD-10-CM

## 2018-07-17 MED FILL — LOSARTAN-HCTZ 100-25 MG TAB: 100-25 | 30 days supply | Qty: 30 | Fill #1

## 2018-07-18 MED ORDER — PRAVASTATIN SODIUM 40 MG PO TABS: 40.0000 mg | ORAL_TABLET | Freq: Every day | ORAL | 0 refills | Status: DC

## 2018-07-18 MED ORDER — AMLODIPINE BESYLATE 10 MG PO TABS: 10.0000 mg | ORAL_TABLET | Freq: Every day | ORAL | 0 refills | Status: DC

## 2018-07-18 MED FILL — PRAVASTATIN NA 40 MG TAB: 40 | 30 days supply | Qty: 30 | Fill #0

## 2018-07-18 MED FILL — AMLODIPINE BESYLATE 10 MG T: 10 | 30 days supply | Qty: 30 | Fill #0

## 2018-07-18 NOTE — Telephone Encounter (Signed)
Need patient to be seen for HTN follow up.  Please schedule with me within next 3 months

## 2018-08-02 ENCOUNTER — Ambulatory Visit: Payer: Self-pay | Admitting: Internal Medicine

## 2018-08-31 MED FILL — PRAVASTATIN NA 40 MG TAB: 40 | 30 days supply | Qty: 30 | Fill #1

## 2018-08-31 MED FILL — LOSARTAN-HCTZ 100-25 MG TAB: 100-25 | 30 days supply | Qty: 30 | Fill #2

## 2018-08-31 MED FILL — AMLODIPINE BESYLATE 10 MG T: 10 | 30 days supply | Qty: 30 | Fill #1

## 2018-11-02 MED FILL — LOSARTAN-HCTZ 100-25 MG TAB: 100-25 | 30 days supply | Qty: 30 | Fill #3

## 2018-11-02 MED FILL — AMLODIPINE BESYLATE 10 MG T: 10 | 30 days supply | Qty: 30 | Fill #2

## 2018-11-02 MED FILL — PRAVASTATIN NA 40 MG TAB: 40 | 30 days supply | Qty: 30 | Fill #2

## 2018-12-27 ENCOUNTER — Ambulatory Visit (INDEPENDENT_AMBULATORY_CARE_PROVIDER_SITE_OTHER): Payer: Managed Care, Other (non HMO) | Admitting: Internal Medicine

## 2018-12-27 ENCOUNTER — Encounter: Payer: Self-pay | Admitting: Internal Medicine

## 2018-12-27 ENCOUNTER — Other Ambulatory Visit: Payer: Self-pay

## 2018-12-27 VITALS — BP 132/71 | HR 80 | Temp 98.6°F | Ht 63.0 in | Wt 183.5 lb

## 2018-12-27 DIAGNOSIS — N182 Chronic kidney disease, stage 2 (mild): Secondary | ICD-10-CM | POA: Diagnosis not present

## 2018-12-27 DIAGNOSIS — E785 Hyperlipidemia, unspecified: Secondary | ICD-10-CM

## 2018-12-27 DIAGNOSIS — E7849 Other hyperlipidemia: Secondary | ICD-10-CM

## 2018-12-27 DIAGNOSIS — I129 Hypertensive chronic kidney disease with stage 1 through stage 4 chronic kidney disease, or unspecified chronic kidney disease: Secondary | ICD-10-CM | POA: Diagnosis not present

## 2018-12-27 DIAGNOSIS — R631 Polydipsia: Secondary | ICD-10-CM

## 2018-12-27 DIAGNOSIS — E1169 Type 2 diabetes mellitus with other specified complication: Secondary | ICD-10-CM

## 2018-12-27 DIAGNOSIS — Z0000 Encounter for general adult medical examination without abnormal findings: Secondary | ICD-10-CM

## 2018-12-27 DIAGNOSIS — E1122 Type 2 diabetes mellitus with diabetic chronic kidney disease: Secondary | ICD-10-CM

## 2018-12-27 DIAGNOSIS — Z23 Encounter for immunization: Secondary | ICD-10-CM

## 2018-12-27 DIAGNOSIS — R350 Frequency of micturition: Secondary | ICD-10-CM

## 2018-12-27 DIAGNOSIS — I10 Essential (primary) hypertension: Secondary | ICD-10-CM

## 2018-12-27 DIAGNOSIS — E1159 Type 2 diabetes mellitus with other circulatory complications: Secondary | ICD-10-CM

## 2018-12-27 DIAGNOSIS — Z79899 Other long term (current) drug therapy: Secondary | ICD-10-CM

## 2018-12-27 DIAGNOSIS — I152 Hypertension secondary to endocrine disorders: Secondary | ICD-10-CM

## 2018-12-27 DIAGNOSIS — E1165 Type 2 diabetes mellitus with hyperglycemia: Secondary | ICD-10-CM | POA: Diagnosis not present

## 2018-12-27 DIAGNOSIS — D170 Benign lipomatous neoplasm of skin and subcutaneous tissue of head, face and neck: Secondary | ICD-10-CM

## 2018-12-27 DIAGNOSIS — Z794 Long term (current) use of insulin: Secondary | ICD-10-CM

## 2018-12-27 LAB — POCT GLYCOSYLATED HEMOGLOBIN (HGB A1C): Hemoglobin A1C: 13 % — AB (ref 4.0–5.6)

## 2018-12-27 LAB — GLUCOSE, CAPILLARY: Glucose-Capillary: 113 mg/dL — ABNORMAL HIGH (ref 70–99)

## 2018-12-27 MED ORDER — AMLODIPINE BESYLATE 10 MG PO TABS: 10.0000 mg | ORAL_TABLET | Freq: Every day | ORAL | 3 refills | Status: DC

## 2018-12-27 MED ORDER — XULTOPHY 100-3.6 UNIT-MG/ML ~~LOC~~ SOPN: 18.0000 [IU] | PEN_INJECTOR | Freq: Every day | SUBCUTANEOUS | 11 refills | Status: DC

## 2018-12-27 MED ORDER — PRAVASTATIN SODIUM 40 MG PO TABS: 40.0000 mg | ORAL_TABLET | Freq: Every day | ORAL | 3 refills | Status: DC

## 2018-12-27 MED ORDER — LOSARTAN POTASSIUM-HCTZ 100-25 MG PO TABS: 1.0000 | ORAL_TABLET | Freq: Every day | ORAL | 3 refills | Status: DC

## 2018-12-27 MED FILL — AMLODIPINE BESYLATE 10 MG T: 10 | 30 days supply | Qty: 30 | Fill #0

## 2018-12-27 MED FILL — LOSARTAN-HCTZ 100-25 MG TAB: 100-25 | 30 days supply | Qty: 30 | Fill #0

## 2018-12-27 MED FILL — PRAVASTATIN NA 40 MG TAB: 40 | 30 days supply | Qty: 30 | Fill #0

## 2018-12-27 NOTE — Patient Instructions (Signed)
Please give me a call if the Claris Che is too expensive and we can try to find an affordable alternative.

## 2018-12-28 DIAGNOSIS — D170 Benign lipomatous neoplasm of skin and subcutaneous tissue of head, face and neck: Secondary | ICD-10-CM | POA: Insufficient documentation

## 2018-12-28 LAB — BMP8+ANION GAP
Anion Gap: 13 mmol/L (ref 10.0–18.0)
BUN/Creatinine Ratio: 20 (ref 12–28)
BUN: 24 mg/dL (ref 8–27)
CO2: 23 mmol/L (ref 20–29)
Calcium: 9.3 mg/dL (ref 8.7–10.3)
Chloride: 104 mmol/L (ref 96–106)
Creatinine, Ser: 1.2 mg/dL — ABNORMAL HIGH (ref 0.57–1.00)
GFR calc Af Amer: 54 mL/min/{1.73_m2} — ABNORMAL LOW (ref >59)
GFR calc non Af Amer: 47 mL/min/{1.73_m2} — ABNORMAL LOW (ref >59)
Glucose: 122 mg/dL — ABNORMAL HIGH (ref 65–99)
Potassium: 4.4 mmol/L (ref 3.5–5.2)
Sodium: 140 mmol/L (ref 134–144)

## 2018-12-28 LAB — LIPID PANEL
Chol/HDL Ratio: 3.8 ratio (ref 0.0–4.4)
Cholesterol, Total: 223 mg/dL — ABNORMAL HIGH (ref 100–199)
HDL: 59 mg/dL (ref >39)
LDL Chol Calc (NIH): 150 mg/dL — ABNORMAL HIGH (ref 0–99)
Triglycerides: 78 mg/dL (ref 0–149)
VLDL Cholesterol Cal: 14 mg/dL (ref 5–40)

## 2018-12-28 NOTE — Assessment & Plan Note (Signed)
HPI: Reports that she did well when she had xultophy however previous cost was almost a thousand dollars a month,  She is now taking novolin N at 30u in AM and 20 in PM.  Sugars have been high and she does have polyuria and polydispia.  She she reports a severe skin reaction to metformin (in chart this seems to be attributed to bactrim) however she is very clear that a physician told her to stop and never take metformin again.  A: Uncontrolled Type 2 DM with CKD stage 2  P: Now has [place] medicare hopefully medications well be more affordable.  Will try to return to [place] starting at 18u daily, I have given her standard self titration instructions. Going forward she would likely benefit from addition of SGLT2i.  Needs DM eye exam, Refer to opthalmology. Requests dr Katy Fitch.

## 2018-12-28 NOTE — Progress Notes (Signed)
  Subjective:  HPI: Mandy Mullen is a 68 y.o. female who presents for f/u DM, HTN  Please see Assessment and Plan below for the status of her chronic medical problems.  Review of Systems: Review of Systems  Constitutional: Negative for fever.  Respiratory: Negative for cough.   Cardiovascular: Negative for chest pain.  Genitourinary: Positive for frequency.  Musculoskeletal: Negative for myalgias.  Endo/Heme/Allergies: Positive for polydipsia.    Objective:  Physical Exam: Vitals:   12/27/18 1021  BP: 132/71  Pulse: 80  Temp: 98.6 F (37 C)  TempSrc: Oral  SpO2: 98%  Weight: 183 lb 8 oz (83.2 kg)  Height: 5\' 3"  (1.6 m)   Body mass index is 32.51 kg/m. Physical Exam Vitals and nursing note reviewed.  Constitutional:      Appearance: Normal appearance.  HENT:     Head: Normocephalic and atraumatic.  Cardiovascular:     Rate and Rhythm: Normal rate and regular rhythm.  Pulmonary:     Effort: Pulmonary effort is normal.     Breath sounds: Normal breath sounds.  Abdominal:     General: Abdomen is flat.     Palpations: Abdomen is soft.  Musculoskeletal:     Right lower leg: No edema.     Left lower leg: No edema.  Neurological:     Mental Status: She is alert and oriented to person, place, and time.  Psychiatric:        Mood and Affect: Mood normal.    Assessment & Plan:  See Encounters Tab for problem based charting.  Medications Ordered Meds ordered this encounter  Medications  . Insulin Degludec-Liraglutide (XULTOPHY) 100-3.6 UNIT-MG/ML SOPN    Sig: Inject 18 Units into the skin daily.    Dispense:  2 pen    Refill:  11  . pravastatin (PRAVACHOL) 40 MG tablet    Sig: Take 1 tablet (40 mg total) by mouth daily.    Dispense:  90 tablet    Refill:  3    IM program  . amLODipine (NORVASC) 10 MG tablet    Sig: Take 1 tablet (10 mg total) by mouth daily. IM PROGRAM    Dispense:  90 tablet    Refill:  3  . losartan-hydrochlorothiazide (HYZAAR)  100-25 MG tablet    Sig: Take 1 tablet by mouth daily.    Dispense:  90 tablet    Refill:  3    IM Program   Other Orders Orders Placed This Encounter  Procedures  . Flu Vaccine MDCK QUAD PF  . Lipid Profile  . BMP8+Anion Gap  . Glucose, capillary  . Ambulatory referral to Ophthalmology    Referral Priority:   Routine    Referral Type:   Consultation    Referral Reason:   Specialty Services Required    Requested Specialty:   Ophthalmology    Number of Visits Requested:   1  . POC Hbg A1C   Follow Up: Return in about 3 months (around 03/27/2019).

## 2018-12-28 NOTE — Assessment & Plan Note (Signed)
HPI: Prescribed pravastatin 40mg  daily  A: HLD  P: Recheck Lipid panel.

## 2018-12-28 NOTE — Assessment & Plan Note (Signed)
HPI: Patient reports soft mass at top of her head over the last several years, it has decreased in size over the last year, she heard from someone that it could be a blood clot and wanted to have it checked out.  A: Lipoma of head  P: Discussed the nature of lipomas and mostly reassured her this was not a blood clot.

## 2018-12-28 NOTE — Assessment & Plan Note (Signed)
HPI: No issues with medications.  A: Essential HTN well controlled  P: Losartan-HCTZ 100-25 daily Amlodipine 10mg  daily

## 2018-12-31 ENCOUNTER — Telehealth: Payer: Self-pay | Admitting: Internal Medicine

## 2018-12-31 NOTE — Telephone Encounter (Signed)
Attempted call x1, no answer, no VM.   Wanted to discuss labs, overall good however cholesterol is up (LDL/bad cholesterol up to 150 from 107), want to make sure (/remind) she is taking her pravastatin every day.  Will reheck lipids at next visit, if not improved may need to escalate statin therapy.

## 2019-01-04 ENCOUNTER — Encounter: Payer: Self-pay | Admitting: Internal Medicine

## 2019-01-04 NOTE — Telephone Encounter (Signed)
Attempted second call, no answer, no VM, will send letter.

## 2019-02-20 MED FILL — AMLODIPINE BESYLATE 10 MG T: 10 | 30 days supply | Qty: 30 | Fill #1

## 2019-02-20 MED FILL — PRAVASTATIN NA 40 MG TAB: 40 | 30 days supply | Qty: 30 | Fill #1

## 2019-02-20 MED FILL — LOSARTAN-HCTZ 100-25 MG TAB: 100-25 | 30 days supply | Qty: 30 | Fill #1

## 2019-04-08 MED FILL — LOSARTAN-HCTZ 100-25 MG TAB: 100-25 | 30 days supply | Qty: 30 | Fill #2

## 2019-04-08 MED FILL — PRAVASTATIN NA 40 MG TAB: 40 | 30 days supply | Qty: 30 | Fill #2

## 2019-04-08 MED FILL — AMLODIPINE BESYLATE 10 MG T: 10 | 30 days supply | Qty: 30 | Fill #2

## 2019-04-24 DIAGNOSIS — D649 Anemia, unspecified: Secondary | ICD-10-CM | POA: Insufficient documentation

## 2019-04-25 ENCOUNTER — Telehealth: Payer: Self-pay | Admitting: *Deleted

## 2019-04-25 ENCOUNTER — Encounter: Payer: Self-pay | Admitting: Internal Medicine

## 2019-04-25 ENCOUNTER — Ambulatory Visit: Payer: Managed Care, Other (non HMO) | Admitting: Internal Medicine

## 2019-04-25 NOTE — Telephone Encounter (Signed)
Pt did not come to her appt this am. Called pt - telephone continues to ring; no answer, unable to leave a message.

## 2019-05-13 MED FILL — AMLODIPINE BESYLATE 10 MG T: 10 | 30 days supply | Qty: 30 | Fill #3

## 2019-05-13 MED FILL — PRAVASTATIN NA 40 MG TAB: 40 | 30 days supply | Qty: 30 | Fill #3

## 2019-05-13 MED FILL — LOSARTAN-HCTZ 100-25 MG TAB: 100-25 | 30 days supply | Qty: 30 | Fill #3

## 2019-06-20 MED FILL — PRAVASTATIN NA 40 MG TAB: 40 | 30 days supply | Qty: 30 | Fill #4

## 2019-06-20 MED FILL — LOSARTAN-HCTZ 100-25 MG TAB: 100-25 | 30 days supply | Qty: 30 | Fill #4

## 2019-06-20 MED FILL — AMLODIPINE BESYLATE 10 MG T: 10 | 30 days supply | Qty: 30 | Fill #4

## 2019-07-25 ENCOUNTER — Encounter: Payer: Self-pay | Admitting: Internal Medicine

## 2019-07-25 ENCOUNTER — Ambulatory Visit: Payer: Managed Care, Other (non HMO) | Admitting: Internal Medicine

## 2019-07-25 ENCOUNTER — Other Ambulatory Visit: Payer: Self-pay

## 2019-07-25 VITALS — BP 127/65 | HR 88 | Temp 98.3°F | Ht 63.0 in | Wt 173.5 lb

## 2019-07-25 DIAGNOSIS — E1159 Type 2 diabetes mellitus with other circulatory complications: Secondary | ICD-10-CM

## 2019-07-25 DIAGNOSIS — E1165 Type 2 diabetes mellitus with hyperglycemia: Secondary | ICD-10-CM

## 2019-07-25 DIAGNOSIS — Z1211 Encounter for screening for malignant neoplasm of colon: Secondary | ICD-10-CM

## 2019-07-25 DIAGNOSIS — I152 Hypertension secondary to endocrine disorders: Secondary | ICD-10-CM

## 2019-07-25 DIAGNOSIS — Z780 Asymptomatic menopausal state: Secondary | ICD-10-CM

## 2019-07-25 DIAGNOSIS — Z0000 Encounter for general adult medical examination without abnormal findings: Secondary | ICD-10-CM

## 2019-07-25 DIAGNOSIS — Z1231 Encounter for screening mammogram for malignant neoplasm of breast: Secondary | ICD-10-CM

## 2019-07-25 LAB — POCT GLYCOSYLATED HEMOGLOBIN (HGB A1C): HbA1c POC (<> result, manual entry): >14 % — AB (ref 4.0–5.6)

## 2019-07-25 LAB — GLUCOSE, CAPILLARY: Glucose-Capillary: 264 mg/dL — ABNORMAL HIGH (ref 70–99)

## 2019-07-25 MED ORDER — RYBELSUS 7 MG PO TABS: 1.0000 | ORAL_TABLET | Freq: Every day | ORAL | 10 refills | Status: DC

## 2019-07-25 MED ORDER — RYBELSUS 3 MG PO TABS: 1.0000 | ORAL_TABLET | Freq: Every day | ORAL | 0 refills | Status: AC

## 2019-07-25 NOTE — Patient Instructions (Addendum)
I am starting you on rybelsus, this you will take in addition to your insulin.  You will start with 3mg  a day and then increase to 7mg  daily after 1 month.

## 2019-07-25 NOTE — Progress Notes (Signed)
  Subjective:  HPI: [name] is a 69 y.o. female who presents for  F/u DM  Please see Assessment and Plan below for the status of her chronic medical problems.  Objective:  Physical Exam: Vitals:   07/25/19 1004  BP: 127/65  Pulse: 88  Temp: 98.3 F (36.8 C)  TempSrc: Oral  SpO2: 100%  Weight: 173 lb 8 oz (78.7 kg)  Height: 5\' 3"  (1.6 m)   Body mass index is 30.73 kg/m. Physical Exam Vitals and nursing note reviewed.  Constitutional:      Appearance: Normal appearance. She is obese.  Cardiovascular:     Rate and Rhythm: Normal rate and regular rhythm.  Pulmonary:     Effort: Pulmonary effort is normal.     Breath sounds: Normal breath sounds.  Neurological:     Mental Status: She is alert.  Psychiatric:        Mood and Affect: Mood normal.    Assessment & Plan:  See Encounters Tab for problem based charting.  Medications Ordered Meds ordered this encounter  Medications  . Semaglutide (RYBELSUS) 3 MG TABS    Sig: Take 1 tablet by mouth daily. For 30 days then increase to 7mg  daily    Dispense:  30 tablet    Refill:  0  . Semaglutide (RYBELSUS) 7 MG TABS    Sig: Take 1 tablet by mouth daily.    Dispense:  30 tablet    Refill:  10   Other Orders Orders Placed This Encounter  Procedures  . DG Bone Density    Standing Status:   Future    Standing Expiration Date:   07/24/2020    Order Specific Question:   Reason for Exam (SYMPTOM  OR DIAGNOSIS REQUIRED)    Answer:   estrogen defeciency, screening    Order Specific Question:   Preferred imaging location?    Answer:   [facility]  . MM Digital Screening    Standing Status:   Future    Standing Expiration Date:   07/24/2020    Order Specific Question:   Reason for Exam (SYMPTOM  OR DIAGNOSIS REQUIRED)    Answer:   breast cancer screening    Order Specific Question:   Preferred imaging location?    Answer:   [facility]  . Glucose, capillary  . Ambulatory referral to Gastroenterology     Referral Priority:   Routine    Referral Type:   Consultation    Referral Reason:   Specialty Services Required    Number of Visits Requested:   1  . POC Hbg A1C   Follow Up: 1-3 months

## 2019-07-26 ENCOUNTER — Telehealth: Payer: Self-pay | Admitting: *Deleted

## 2019-07-26 MED FILL — RYBELSUS 3 MG TABS: 3 | 30 days supply | Qty: 30 | Fill #0

## 2019-07-26 NOTE — Telephone Encounter (Signed)
Received PA request from pt's pharmacy ([facility]) for Rybelsus 3mg  tabs.  CMA contacted pharmacy-per pharmacy, the rx went through, but insurance did not take anything off-the price of medication is still over $800.  Pharmacy

## 2019-07-31 NOTE — Assessment & Plan Note (Signed)
HPI: She presents today for overdue follow-up for her diabetes.  She reports to me that Claris Che was reportedly over thousand dollars a month even with insurance.  She did not fill this medication.  She reports she remains on Novolin N 30 units in the morning 20 units at night. Additionally I reviewed her previous Metformin intolerance she reports that it caused blisters "all over my body, I got a test sent to [facility] but said it was due to the Metformin" she does admit to polyuria polydipsia.  She has not seen her ophthalmologist for follow-up recently.  Assessment uncontrolled type 2 diabetes mellitus with hyperglycemia, associated with hypertension, CKD stage IIIa, hyperlipidemia.  Plan I discussed with her the importance of medication adherence that she must call my office if she is unable to fill prescriptions and that we have to get better control of her diabetes. She remains reluctant to medications.  I did discuss with her that Rybelsus is a GLP-1 receptor agonist that I think would be of benefit to her there is a Pharmacist, community co-pay card that should limit the cost $10 a month and she seemed interested in this.  We will start that now I told her I would like to follow-up with her sooner but she has not felt interested in following up with me sooner than 3 months.  This is a not ideal but it would be better than her waiting a year.  She will continue Novalon in 30 units in the morning and 20 units at night I would like her to bring a blood glucose log.  She will start Rybelsus at 3 mg in the morning increase after 1 month to 7 mg daily.

## 2019-07-31 NOTE — Assessment & Plan Note (Signed)
Discussed multiple overdue healthcare maintenance items and placed orders for them.

## 2019-07-31 NOTE — Assessment & Plan Note (Signed)
HPI: Did not bring her medications today she believes she is taking both the amlodipine and the combination pill.  Assessment essential hypertension well-controlled  Plan Discussed importance of bringing her medications so that I may verify what she is taking. Otherwise continue losartan hydrochlorothiazide 100-25 mg daily along with amlodipine 10 mg daily.

## 2019-08-01 ENCOUNTER — Encounter: Payer: Self-pay | Admitting: *Deleted

## 2019-08-01 MED FILL — LOSARTAN-HCTZ 100-25 MG TAB: 100-25 | 30 days supply | Qty: 30 | Fill #5

## 2019-08-01 MED FILL — PRAVASTATIN NA 40 MG TAB: 40 | 30 days supply | Qty: 30 | Fill #5

## 2019-08-01 MED FILL — AMLODIPINE BESYLATE 10 MG T: 10 | 30 days supply | Qty: 30 | Fill #5

## 2019-09-02 NOTE — Addendum Note (Signed)
Addended by: Hulan Fray on: 09/02/2019 06:09 PM   Modules accepted: Orders

## 2019-09-12 MED FILL — PRAVASTATIN NA 40 MG TAB: 40 | 30 days supply | Qty: 30 | Fill #6

## 2019-09-12 MED FILL — AMLODIPINE BESYLATE 10 MG T: 10 | 30 days supply | Qty: 30 | Fill #6

## 2019-09-12 MED FILL — LOSARTAN-HCTZ 100-25 MG TAB: 100-25 | 30 days supply | Qty: 30 | Fill #6

## 2019-10-22 MED FILL — LOSARTAN-HCTZ 100-25 MG TAB: 100-25 | 30 days supply | Qty: 30 | Fill #7

## 2019-10-22 MED FILL — AMLODIPINE BESYLATE 10 MG T: 10 | 30 days supply | Qty: 30 | Fill #7

## 2019-10-22 MED FILL — PRAVASTATIN NA 40 MG TAB: 40 | 30 days supply | Qty: 30 | Fill #7

## 2019-11-25 MED FILL — LOSARTAN-HCTZ 100-25 MG TAB: 100-25 | 30 days supply | Qty: 30 | Fill #8

## 2019-11-25 MED FILL — PRAVASTATIN NA 40 MG TAB: 40 | 30 days supply | Qty: 30 | Fill #8

## 2019-11-25 MED FILL — AMLODIPINE BESYLATE 10 MG T: 10 | 30 days supply | Qty: 30 | Fill #8

## 2020-01-03 ENCOUNTER — Other Ambulatory Visit: Payer: Self-pay | Admitting: *Deleted

## 2020-01-03 ENCOUNTER — Encounter: Payer: Self-pay | Admitting: *Deleted

## 2020-01-03 DIAGNOSIS — I10 Essential (primary) hypertension: Secondary | ICD-10-CM

## 2020-01-03 NOTE — Telephone Encounter (Signed)
Patient has documented allergy to lisnopril. Will forward request for refills on amlodipine and losartan-HCTZ. Hubbard Hartshorn, BSN, RN-BC

## 2020-01-03 NOTE — Telephone Encounter (Signed)
Patient walked in stating she had requested refills two days ago of Losartan,Lisinopril and Amlodipine to be called to [facility] and has not been done. Pharmacy told her they had sent it over twice per patient.

## 2020-01-06 ENCOUNTER — Other Ambulatory Visit: Payer: Self-pay | Admitting: Internal Medicine

## 2020-01-06 MED ORDER — AMLODIPINE BESYLATE 10 MG PO TABS: 10.0000 mg | ORAL_TABLET | Freq: Every day | ORAL | 0 refills | Status: DC

## 2020-01-06 MED ORDER — LOSARTAN POTASSIUM-HCTZ 100-25 MG PO TABS: 1.0000 | ORAL_TABLET | Freq: Every day | ORAL | 0 refills | Status: DC

## 2020-01-06 MED FILL — LOSARTAN-HCTZ 100-25 MG TAB: 100-25 | 30 days supply | Qty: 30 | Fill #0

## 2020-01-06 MED FILL — AMLODIPINE BESYLATE 10 MG T: 10 | 30 days supply | Qty: 30 | Fill #0

## 2020-01-06 NOTE — Telephone Encounter (Signed)
Placed 90 day Rx without refills. Needs to be seen within the 90 days, ideally by me.

## 2020-01-07 ENCOUNTER — Other Ambulatory Visit: Payer: Self-pay

## 2020-01-07 DIAGNOSIS — E7849 Other hyperlipidemia: Secondary | ICD-10-CM

## 2020-01-07 MED ORDER — PRAVASTATIN SODIUM 40 MG PO TABS: 40.0000 mg | ORAL_TABLET | Freq: Every day | ORAL | 3 refills | Status: DC

## 2020-01-07 MED FILL — PRAVASTATIN NA 40 MG TAB: 40 | 30 days supply | Qty: 30 | Fill #0

## 2020-02-24 ENCOUNTER — Telehealth: Payer: Self-pay

## 2020-02-24 MED FILL — PRAVASTATIN NA 40 MG TAB: 40 | 30 days supply | Qty: 30 | Fill #1

## 2020-02-24 MED FILL — AMLODIPINE BESYLATE 10 MG T: 10 | 30 days supply | Qty: 30 | Fill #1

## 2020-02-24 MED FILL — LOSARTAN-HCTZ 100-25 MG TAB: 100-25 | 30 days supply | Qty: 30 | Fill #1

## 2020-02-24 NOTE — Telephone Encounter (Signed)
Called pt - stated she needs refills on amlodipine, pravastatin, and losartan-hctz. Informed they were refilled 12/21 with 3 month supply; stated she only received 30 pills. I asked pt to call the [facility] - stated she will - and to call me back if they have questions.

## 2020-02-24 NOTE — Telephone Encounter (Signed)
Pt is completely out of medicine, pt is wanting to refill all her medicine [phone]

## 2020-03-12 ENCOUNTER — Encounter: Payer: Self-pay | Admitting: Internal Medicine

## 2020-03-12 ENCOUNTER — Other Ambulatory Visit: Payer: Self-pay | Admitting: Internal Medicine

## 2020-03-12 ENCOUNTER — Other Ambulatory Visit: Payer: Self-pay

## 2020-03-12 ENCOUNTER — Ambulatory Visit: Payer: Self-pay | Admitting: Internal Medicine

## 2020-03-12 VITALS — BP 132/65 | HR 80 | Temp 98.1°F | Ht 63.0 in | Wt 169.1 lb

## 2020-03-12 DIAGNOSIS — E785 Hyperlipidemia, unspecified: Secondary | ICD-10-CM

## 2020-03-12 DIAGNOSIS — I10 Essential (primary) hypertension: Secondary | ICD-10-CM

## 2020-03-12 DIAGNOSIS — E1122 Type 2 diabetes mellitus with diabetic chronic kidney disease: Secondary | ICD-10-CM

## 2020-03-12 DIAGNOSIS — I152 Hypertension secondary to endocrine disorders: Secondary | ICD-10-CM

## 2020-03-12 DIAGNOSIS — E1165 Type 2 diabetes mellitus with hyperglycemia: Secondary | ICD-10-CM

## 2020-03-12 DIAGNOSIS — N183 Chronic kidney disease, stage 3 unspecified: Secondary | ICD-10-CM

## 2020-03-12 DIAGNOSIS — E1159 Type 2 diabetes mellitus with other circulatory complications: Secondary | ICD-10-CM

## 2020-03-12 LAB — GLUCOSE, CAPILLARY: Glucose-Capillary: 203 mg/dL — ABNORMAL HIGH (ref 70–99)

## 2020-03-12 LAB — POCT GLYCOSYLATED HEMOGLOBIN (HGB A1C): Hemoglobin A1C: 13.6 % — AB (ref 4.0–5.6)

## 2020-03-12 MED ORDER — LOSARTAN POTASSIUM-HCTZ 100-25 MG PO TABS: 1.0000 | ORAL_TABLET | Freq: Every day | ORAL | 3 refills | Status: DC

## 2020-03-12 MED ORDER — AMLODIPINE BESYLATE 10 MG PO TABS: 10.0000 mg | ORAL_TABLET | Freq: Every day | ORAL | 3 refills | Status: DC

## 2020-03-12 MED FILL — AMLODIPINE BESYLATE 10 MG T: 10 | 30 days supply | Qty: 30 | Fill #0

## 2020-03-12 MED FILL — LOSARTAN-HCTZ 100-25 MG TAB: 100-25 | 30 days supply | Qty: 30 | Fill #0

## 2020-03-13 ENCOUNTER — Other Ambulatory Visit: Payer: Self-pay | Admitting: Internal Medicine

## 2020-03-13 LAB — BMP8+ANION GAP
Anion Gap: 17 mmol/L (ref 10.0–18.0)
BUN/Creatinine Ratio: 26 (ref 12–28)
BUN: 33 mg/dL — ABNORMAL HIGH (ref 8–27)
CO2: 24 mmol/L (ref 20–29)
Calcium: 9.4 mg/dL (ref 8.7–10.3)
Chloride: 98 mmol/L (ref 96–106)
Creatinine, Ser: 1.25 mg/dL — ABNORMAL HIGH (ref 0.57–1.00)
GFR calc Af Amer: 51 mL/min/{1.73_m2} — ABNORMAL LOW (ref >59)
GFR calc non Af Amer: 44 mL/min/{1.73_m2} — ABNORMAL LOW (ref >59)
Glucose: 208 mg/dL — ABNORMAL HIGH (ref 65–99)
Potassium: 4.1 mmol/L (ref 3.5–5.2)
Sodium: 139 mmol/L (ref 134–144)

## 2020-03-13 LAB — LIPID PANEL
Chol/HDL Ratio: 4.2 ratio (ref 0.0–4.4)
Cholesterol, Total: 248 mg/dL — ABNORMAL HIGH (ref 100–199)
HDL: 59 mg/dL (ref >39)
LDL Chol Calc (NIH): 164 mg/dL — ABNORMAL HIGH (ref 0–99)
Triglycerides: 140 mg/dL (ref 0–149)
VLDL Cholesterol Cal: 25 mg/dL (ref 5–40)

## 2020-03-13 MED ORDER — ROSUVASTATIN CALCIUM 20 MG PO TABS: 20.0000 mg | ORAL_TABLET | Freq: Every day | ORAL | 11 refills | Status: DC

## 2020-03-13 MED FILL — ROSUVASTATIN CALCIUM 20 MG: 20 | 30 days supply | Qty: 30 | Fill #0

## 2020-03-13 NOTE — Assessment & Plan Note (Signed)
HPI: Affordability is a main focus for [place].  She still has not signed up for Medicare part B but is feeling more motivated to.  She is able to get her blood pressure medications at a discount at our outpatient pharmacy and has been taking them regularly.  Denies any side effects  Assessment essential hypertension  Plan Continue losartan hydrochlorothiazide 100-25 mg daily along with amlodipine 10 mg daily

## 2020-03-13 NOTE — Assessment & Plan Note (Signed)
HPI: She knows that her sugars have been too high however has not signed up for Medicare part B or D and has trouble affording medications.  She currently uses Novolin in which she picks up from [place].  She reports using 35 units in the morning and 25 units at night.  Reports sugars are usually in the 150s to 200s range.  No hypoglycemia.  Mild polyuria.  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  She reports to me that she will do this within the next few months/next visit. In the interim I have instructed her to increase her Novolin in monitor for hypoglycemia.

## 2020-03-13 NOTE — Assessment & Plan Note (Addendum)
Reports adherence to pravastatin but not convincingly.  Recheck lipid panel.  LDL too high, recommend high intensity statin.  Change Pravastatin 40mg  to Crestor 20mg  daily

## 2020-03-13 NOTE — Progress Notes (Signed)
  Subjective:  HPI: Mandy Mullen is a 70 y.o. female who presents for DM, HTN follow up, overdue  Please see Assessment and Plan below for the status of her chronic medical problems.  Objective:  Physical Exam: Vitals:   03/12/20 0938  BP: 132/65  Pulse: 80  Temp: 98.1 F (36.7 C)  TempSrc: Oral  SpO2: 100%  Weight: 169 lb 1.6 oz (76.7 kg)  Height: 5\' 3"  (1.6 m)   Body mass index is 29.95 kg/m. Physical Exam Vitals and nursing note reviewed.  Constitutional:      Appearance: Normal appearance.  Cardiovascular:     Rate and Rhythm: Normal rate.     Pulses: Normal pulses.  Pulmonary:     Effort: Pulmonary effort is normal.     Breath sounds: Normal breath sounds.  Abdominal:     General: Abdomen is flat.     Palpations: Abdomen is soft.  Neurological:     Mental Status: She is alert.    Assessment & Plan:  See Encounters Tab for problem based charting.  Medications Ordered Meds ordered this encounter  Medications  . losartan-hydrochlorothiazide (HYZAAR) 100-25 MG tablet    Sig: Take 1 tablet by mouth daily.    Dispense:  90 tablet    Refill:  3    IM Program  . amLODipine (NORVASC) 10 MG tablet    Sig: Take 1 tablet (10 mg total) by mouth daily. IM PROGRAM    Dispense:  90 tablet    Refill:  3   Other Orders Orders Placed This Encounter  Procedures  . BMP8+Anion Gap  . Lipid Profile  . Glucose, capillary  . POC Hbg A1C   Follow Up: Return in about 3 months (around 06/09/2020).

## 2020-03-13 NOTE — Assessment & Plan Note (Signed)
Recheck BMP.

## 2020-03-24 ENCOUNTER — Inpatient Hospital Stay (HOSPITAL_COMMUNITY): Payer: Medicare Other

## 2020-03-24 ENCOUNTER — Inpatient Hospital Stay (HOSPITAL_COMMUNITY)
Admission: EM | Admit: 2020-03-24 | Discharge: 2020-03-28 | DRG: 637 | Disposition: A | Discharge status: Home / Self Care | Payer: Medicare Other | Attending: Internal Medicine | Admitting: Internal Medicine

## 2020-03-24 ENCOUNTER — Emergency Department (HOSPITAL_COMMUNITY): Payer: Medicare Other

## 2020-03-24 ENCOUNTER — Encounter (HOSPITAL_COMMUNITY): Payer: Self-pay

## 2020-03-24 ENCOUNTER — Other Ambulatory Visit: Payer: Self-pay

## 2020-03-24 DIAGNOSIS — R0602 Shortness of breath: Secondary | ICD-10-CM

## 2020-03-24 DIAGNOSIS — Z794 Long term (current) use of insulin: Secondary | ICD-10-CM | POA: Diagnosis not present

## 2020-03-24 DIAGNOSIS — Z881 Allergy status to other antibiotic agents status: Secondary | ICD-10-CM | POA: Diagnosis not present

## 2020-03-24 DIAGNOSIS — Z888 Allergy status to other drugs, medicaments and biological substances status: Secondary | ICD-10-CM

## 2020-03-24 DIAGNOSIS — R571 Hypovolemic shock: Secondary | ICD-10-CM | POA: Diagnosis present

## 2020-03-24 DIAGNOSIS — R0902 Hypoxemia: Secondary | ICD-10-CM | POA: Diagnosis present

## 2020-03-24 DIAGNOSIS — Z91013 Allergy to seafood: Secondary | ICD-10-CM

## 2020-03-24 DIAGNOSIS — Z79899 Other long term (current) drug therapy: Secondary | ICD-10-CM

## 2020-03-24 DIAGNOSIS — Z8249 Family history of ischemic heart disease and other diseases of the circulatory system: Secondary | ICD-10-CM | POA: Diagnosis not present

## 2020-03-24 DIAGNOSIS — N179 Acute kidney failure, unspecified: Secondary | ICD-10-CM | POA: Diagnosis present

## 2020-03-24 DIAGNOSIS — Z823 Family history of stroke: Secondary | ICD-10-CM

## 2020-03-24 DIAGNOSIS — N1831 Chronic kidney disease, stage 3a: Secondary | ICD-10-CM

## 2020-03-24 DIAGNOSIS — I129 Hypertensive chronic kidney disease with stage 1 through stage 4 chronic kidney disease, or unspecified chronic kidney disease: Secondary | ICD-10-CM | POA: Diagnosis present

## 2020-03-24 DIAGNOSIS — E1110 Type 2 diabetes mellitus with ketoacidosis without coma: Secondary | ICD-10-CM | POA: Diagnosis present

## 2020-03-24 DIAGNOSIS — E1165 Type 2 diabetes mellitus with hyperglycemia: Secondary | ICD-10-CM

## 2020-03-24 DIAGNOSIS — Z20822 Contact with and (suspected) exposure to covid-19: Secondary | ICD-10-CM | POA: Diagnosis present

## 2020-03-24 DIAGNOSIS — D631 Anemia in chronic kidney disease: Secondary | ICD-10-CM | POA: Diagnosis present

## 2020-03-24 DIAGNOSIS — E1122 Type 2 diabetes mellitus with diabetic chronic kidney disease: Secondary | ICD-10-CM | POA: Diagnosis present

## 2020-03-24 DIAGNOSIS — Z91012 Allergy to eggs: Secondary | ICD-10-CM

## 2020-03-24 DIAGNOSIS — E871 Hypo-osmolality and hyponatremia: Secondary | ICD-10-CM | POA: Diagnosis present

## 2020-03-24 DIAGNOSIS — E875 Hyperkalemia: Secondary | ICD-10-CM | POA: Diagnosis not present

## 2020-03-24 DIAGNOSIS — E11649 Type 2 diabetes mellitus with hypoglycemia without coma: Secondary | ICD-10-CM | POA: Diagnosis not present

## 2020-03-24 DIAGNOSIS — E860 Dehydration: Secondary | ICD-10-CM | POA: Diagnosis present

## 2020-03-24 DIAGNOSIS — E872 Acidosis, unspecified: Secondary | ICD-10-CM

## 2020-03-24 DIAGNOSIS — E785 Hyperlipidemia, unspecified: Secondary | ICD-10-CM | POA: Diagnosis present

## 2020-03-24 DIAGNOSIS — N189 Chronic kidney disease, unspecified: Secondary | ICD-10-CM

## 2020-03-24 DIAGNOSIS — Z833 Family history of diabetes mellitus: Secondary | ICD-10-CM | POA: Diagnosis not present

## 2020-03-24 LAB — BASIC METABOLIC PANEL
Anion gap: 14 (ref 5–15)
Anion gap: 12 (ref 5–15)
BUN: 85 mg/dL — ABNORMAL HIGH (ref 8–23)
BUN: 61 mg/dL — ABNORMAL HIGH (ref 8–23)
BUN: 69 mg/dL — ABNORMAL HIGH (ref 8–23)
CO2: <7 mmol/L — ABNORMAL LOW (ref 22–32)
CO2: 24 mmol/L (ref 22–32)
CO2: 25 mmol/L (ref 22–32)
Calcium: 8.4 mg/dL — ABNORMAL LOW (ref 8.9–10.3)
Calcium: 8.2 mg/dL — ABNORMAL LOW (ref 8.9–10.3)
Calcium: 8.1 mg/dL — ABNORMAL LOW (ref 8.9–10.3)
Chloride: 91 mmol/L — ABNORMAL LOW (ref 98–111)
Chloride: 96 mmol/L — ABNORMAL LOW (ref 98–111)
Chloride: 99 mmol/L (ref 98–111)
Creatinine, Ser: 2.8 mg/dL — ABNORMAL HIGH (ref 0.44–1.00)
Creatinine, Ser: 2.15 mg/dL — ABNORMAL HIGH (ref 0.44–1.00)
Creatinine, Ser: 1.88 mg/dL — ABNORMAL HIGH (ref 0.44–1.00)
GFR, Estimated: 18 mL/min — ABNORMAL LOW (ref >60)
GFR, Estimated: 24 mL/min — ABNORMAL LOW (ref >60)
GFR, Estimated: 29 mL/min — ABNORMAL LOW (ref >60)
Glucose, Bld: 903 mg/dL (ref 70–99)
Glucose, Bld: 367 mg/dL — ABNORMAL HIGH (ref 70–99)
Glucose, Bld: 240 mg/dL — ABNORMAL HIGH (ref 70–99)
Potassium: 6.7 mmol/L (ref 3.5–5.1)
Potassium: 3.9 mmol/L (ref 3.5–5.1)
Potassium: 3.6 mmol/L (ref 3.5–5.1)
Sodium: 129 mmol/L — ABNORMAL LOW (ref 135–145)
Sodium: 134 mmol/L — ABNORMAL LOW (ref 135–145)
Sodium: 136 mmol/L (ref 135–145)

## 2020-03-24 LAB — BETA-HYDROXYBUTYRIC ACID
Beta-Hydroxybutyric Acid: >8 mmol/L — ABNORMAL HIGH (ref 0.05–0.27)
Beta-Hydroxybutyric Acid: 7.93 mmol/L — ABNORMAL HIGH (ref 0.05–0.27)
Beta-Hydroxybutyric Acid: 2.81 mmol/L — ABNORMAL HIGH (ref 0.05–0.27)

## 2020-03-24 LAB — I-STAT CHEM 8, ED
BUN: 82 mg/dL — ABNORMAL HIGH (ref 8–23)
Calcium, Ion: 1.02 mmol/L — ABNORMAL LOW (ref 1.15–1.40)
Chloride: 98 mmol/L (ref 98–111)
Creatinine, Ser: 2.2 mg/dL — ABNORMAL HIGH (ref 0.44–1.00)
Glucose, Bld: >700 mg/dL (ref 70–99)
HCT: 36 % (ref 36.0–46.0)
Hemoglobin: 12.2 g/dL (ref 12.0–15.0)
Potassium: 6.4 mmol/L (ref 3.5–5.1)
Sodium: 127 mmol/L — ABNORMAL LOW (ref 135–145)
TCO2: 9 mmol/L — ABNORMAL LOW (ref 22–32)

## 2020-03-24 LAB — GLUCOSE, CAPILLARY
Glucose-Capillary: 140 mg/dL — ABNORMAL HIGH (ref 70–99)
Glucose-Capillary: 168 mg/dL — ABNORMAL HIGH (ref 70–99)
Glucose-Capillary: 201 mg/dL — ABNORMAL HIGH (ref 70–99)
Glucose-Capillary: 298 mg/dL — ABNORMAL HIGH (ref 70–99)
Glucose-Capillary: 318 mg/dL — ABNORMAL HIGH (ref 70–99)
Glucose-Capillary: 388 mg/dL — ABNORMAL HIGH (ref 70–99)
Glucose-Capillary: 403 mg/dL — ABNORMAL HIGH (ref 70–99)
Glucose-Capillary: 412 mg/dL — ABNORMAL HIGH (ref 70–99)
Glucose-Capillary: 463 mg/dL — ABNORMAL HIGH (ref 70–99)
Glucose-Capillary: 479 mg/dL — ABNORMAL HIGH (ref 70–99)
Glucose-Capillary: 493 mg/dL — ABNORMAL HIGH (ref 70–99)
Glucose-Capillary: 72 mg/dL (ref 70–99)

## 2020-03-24 LAB — ECHOCARDIOGRAM COMPLETE
Area-P 1/2: 2.76 cm2
Calc EF: 72 %
Height: 63 in
S' Lateral: 2.1 cm
Single Plane A2C EF: 77.4 %
Single Plane A4C EF: 68.7 %
Weight: 2720 oz

## 2020-03-24 LAB — CBG MONITORING, ED
Glucose-Capillary: >600 mg/dL (ref 70–99)
Glucose-Capillary: >600 mg/dL (ref 70–99)
Glucose-Capillary: >600 mg/dL (ref 70–99)
Glucose-Capillary: >600 mg/dL (ref 70–99)
Glucose-Capillary: >600 mg/dL (ref 70–99)
Glucose-Capillary: >600 mg/dL (ref 70–99)
Glucose-Capillary: 572 mg/dL (ref 70–99)
Glucose-Capillary: 593 mg/dL (ref 70–99)

## 2020-03-24 LAB — TROPONIN I (HIGH SENSITIVITY): Troponin I (High Sensitivity): 20 ng/L — ABNORMAL HIGH (ref <18)

## 2020-03-24 LAB — CBC WITH DIFFERENTIAL/PLATELET
Abs Immature Granulocytes: 0.32 10*3/uL — ABNORMAL HIGH (ref 0.00–0.07)
Basophils Absolute: 0 10*3/uL (ref 0.0–0.1)
Basophils Relative: 0 %
Eosinophils Absolute: 0 10*3/uL (ref 0.0–0.5)
Eosinophils Relative: 0 %
HCT: 35.9 % — ABNORMAL LOW (ref 36.0–46.0)
Hemoglobin: 10.7 g/dL — ABNORMAL LOW (ref 12.0–15.0)
Immature Granulocytes: 1 %
Lymphocytes Relative: 3 %
Lymphs Abs: 0.8 10*3/uL (ref 0.7–4.0)
MCH: 29.6 pg (ref 26.0–34.0)
MCHC: 29.8 g/dL — ABNORMAL LOW (ref 30.0–36.0)
MCV: 99.2 fL (ref 80.0–100.0)
Monocytes Absolute: 0.7 10*3/uL (ref 0.1–1.0)
Monocytes Relative: 3 %
Neutro Abs: 22.6 10*3/uL — ABNORMAL HIGH (ref 1.7–7.7)
Neutrophils Relative %: 93 %
Platelets: 325 10*3/uL (ref 150–400)
RBC: 3.62 MIL/uL — ABNORMAL LOW (ref 3.87–5.11)
RDW: 15.1 % (ref 11.5–15.5)
WBC: 24.4 10*3/uL — ABNORMAL HIGH (ref 4.0–10.5)
nRBC: 0.2 % (ref 0.0–0.2)

## 2020-03-24 LAB — BLOOD GAS, VENOUS
Acid-base deficit: 23.3 mmol/L — ABNORMAL HIGH (ref 0.0–2.0)
Bicarbonate: 5.5 mmol/L — ABNORMAL LOW (ref 20.0–28.0)
O2 Saturation: 82.7 %
Patient temperature: 98.6
pCO2, Ven: 19.2 mmHg — CL (ref 44.0–60.0)
pH, Ven: 7.086 — CL (ref 7.250–7.430)
pO2, Ven: 64.3 mmHg — ABNORMAL HIGH (ref 32.0–45.0)

## 2020-03-24 LAB — RESP PANEL BY RT-PCR (FLU A&B, COVID) ARPGX2
Influenza A by PCR: NEGATIVE
Influenza B by PCR: NEGATIVE
SARS Coronavirus 2 by RT PCR: NEGATIVE

## 2020-03-24 LAB — MRSA PCR SCREENING: MRSA by PCR: NEGATIVE

## 2020-03-24 MED ORDER — INSULIN REGULAR(HUMAN) IN NACL 100-0.9 UT/100ML-% IV SOLN: INTRAVENOUS | Status: DC

## 2020-03-24 MED ORDER — LACTATED RINGERS IV BOLUS
1000.0000 mL | Freq: Once | INTRAVENOUS | Status: AC
  Administered 2020-03-24: 1000 mL via INTRAVENOUS

## 2020-03-24 MED ORDER — INSULIN REGULAR(HUMAN) IN NACL 100-0.9 UT/100ML-% IV SOLN
INTRAVENOUS | Status: DC
  Administered 2020-03-24: 10 [IU]/h via INTRAVENOUS
  Administered 2020-03-24: 13 [IU]/h via INTRAVENOUS
  Administered 2020-03-24: 21 [IU]/h via INTRAVENOUS
  Filled 2020-03-24 (×3): qty 100

## 2020-03-24 MED ORDER — DEXTROSE IN LACTATED RINGERS 5 % IV SOLN: INTRAVENOUS | Status: DC

## 2020-03-24 MED ORDER — LACTATED RINGERS IV SOLN: INTRAVENOUS | Status: DC

## 2020-03-24 MED ORDER — DEXTROSE IN LACTATED RINGERS 5 % IV SOLN
INTRAVENOUS | Status: DC
  Administered 2020-03-25: 125 mL/h via INTRAVENOUS

## 2020-03-24 MED ORDER — CHLORHEXIDINE GLUCONATE CLOTH 2 % EX PADS
6.0000 | MEDICATED_PAD | Freq: Every day | CUTANEOUS | Status: DC
  Administered 2020-03-24 – 2020-03-25 (×2): 6 via TOPICAL

## 2020-03-24 MED ORDER — ORAL CARE MOUTH RINSE
15.0000 mL | Freq: Two times a day (BID) | OROMUCOSAL | Status: DC
  Administered 2020-03-24 – 2020-03-26 (×5): 15 mL via OROMUCOSAL

## 2020-03-24 MED ORDER — DEXTROSE 50 % IV SOLN: 0.0000 mL | INTRAVENOUS | Status: DC | PRN

## 2020-03-24 MED ORDER — LACTATED RINGERS IV BOLUS
20.0000 mL/kg | Freq: Once | INTRAVENOUS | Status: AC
  Administered 2020-03-24: 1542 mL via INTRAVENOUS

## 2020-03-24 MED ORDER — ALBUTEROL SULFATE (2.5 MG/3ML) 0.083% IN NEBU: 10.0000 mg | INHALATION_SOLUTION | Freq: Once | RESPIRATORY_TRACT | Status: DC

## 2020-03-24 MED ORDER — ENOXAPARIN SODIUM 30 MG/0.3ML ~~LOC~~ SOLN
30.0000 mg | SUBCUTANEOUS | Status: DC
  Administered 2020-03-24 – 2020-03-26 (×3): 30 mg via SUBCUTANEOUS
  Filled 2020-03-24 (×3): qty 0.3

## 2020-03-24 MED ORDER — LACTATED RINGERS IV BOLUS
1000.0000 mL | INTRAVENOUS | Status: AC
  Administered 2020-03-24 (×2): 1000 mL via INTRAVENOUS

## 2020-03-24 MED ORDER — STERILE WATER FOR INJECTION IV SOLN
INTRAVENOUS | Status: AC
  Filled 2020-03-24: qty 150

## 2020-03-24 NOTE — ED Triage Notes (Signed)
Pt arrives via [facility] from home. Pt reports weakness for 3 days. Upon arrival pts CBG was 557. Pt administered her self 35 units of Subq insulin. Pt sts she thinks it was Novolog insulin. Pt is unsure. Per EMS pt was hypotensive with BP of 92/40. Pt administered 800cc of NS. Per EMS- fire reported an O2 sat of 85% on RA- pt placed on 3L via [place]- Pt does not wear o2 at baseline.

## 2020-03-24 NOTE — ED Notes (Signed)
Next CBG due at 1343

## 2020-03-24 NOTE — ED Notes (Signed)
Attempted to obtain IV access - unsuccessful for additional IV site

## 2020-03-24 NOTE — ED Notes (Signed)
Next CBG due at 1422

## 2020-03-24 NOTE — ED Notes (Addendum)
IV in left upper arm- Infiltrated. Pt denies any pain. Moderate Edema noted to area. No redness noted. Cool compress applied. Consult for IV team placed. Pt had LR infusing through this line. LR paused at this time.

## 2020-03-24 NOTE — ED Notes (Signed)
Kathrynn Humble, MD notified of critical values, given copy of results.

## 2020-03-24 NOTE — ED Notes (Signed)
IV in right AC flushes easily and pulls back blood.

## 2020-03-24 NOTE — Progress Notes (Signed)
Inpatient Diabetes Program Recommendations  AACE/ADA: New Consensus Statement on Inpatient Glycemic Control (2015)  Target Ranges:  Prepandial:   less than 140 mg/dL      Peak postprandial:   less than 180 mg/dL (1-2 hours)      Critically ill patients:  140 - 180 mg/dL   Lab Results  Component Value Date   GLUCAP >600 (HH) 03/24/2020   HGBA1C 13.6 (A) 03/12/2020    Review of Glycemic Control  Diabetes history: DM2 Outpatient Diabetes medications: NPH 35 units am + 25 units pm Current orders for Inpatient glycemic control: IV insulin  Inpatient Diabetes Program Recommendations:   Consult received. Patient is in emergency room with IV insulin via endotool. Will follow and plan to see patient to discuss diabetes management.  Thank you, Nani Gasser. Hannia Matchett, RN, MSN, CDE  Diabetes Coordinator Inpatient Glycemic Control Team Team Pager [phone] (8am-5pm) 03/24/2020 11:18 AM

## 2020-03-24 NOTE — ED Provider Notes (Signed)
[facility] DEPT Provider Note   CSN: [id number] Arrival date & time: 03/24/20  0743     History Chief Complaint  Patient presents with  . Hyperglycemia    Mandy Mullen is a 70 y.o. female with past medical history of hyperlipidemia, hypertension, insulin-dependent type 2 diabetes, CKD stage III presents the emergency department today for weakness by EMS.  When EMS arrived CBG was 57, patient managed herself 35 units of subcu insulin.  Patient states that this is her normal dose of insulin she takes in the morning.  Per EMS fire reported an oxygen saturation of 85% on room air, they placed her on 3 L, when patient arrived here she was satting 100% on room air.  Unsure if fire had an accurate reading.  Patient states that she has been feeling weak for the past 3 days, states that she does feel slightly short of breath, denies any chest pain, abdominal pain.  States that she has been having some nausea vomiting for the past 3 days as well.  Has not been checking her blood sugars.  States that she has been taking her 35 units of insulin in the morning and her 20 units of insulin at night religiously.  Denies any fevers or chills.  Denies any sick contacts.  Denies any cough or or URI symptoms.  Patient has been vaccinated and boosted against COVID.  Denies any sick contacts.  States that she has had anything to eat or drink for the past 3 days because she continuously vomits.  Denies any urinary symptoms.  HPI     Past Medical History:  Diagnosis Date  . History of blood transfusion 1991   "related to hysterectomy/left ovary OR" (11/28/2017)  . History of herpes genitalis   . Hyperlipidemia   . Hypertension   . MRSA (methicillin resistant Staphylococcus aureus)    Reccurent MRSA abscesses  . Type II diabetes mellitus [facility])     Patient Active Problem List   Diagnosis Date Noted  . Diabetic keto-acidosis ([place]) 03/24/2020  . Normocytic anemia 04/24/2019   . 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 CKD stage 3 due to type 2 diabetes mellitus ([place]) 07/29/2016  . Healthcare maintenance 02/26/2014  . Hypertension associated with diabetes ([place]) 12/17/2007  . Hyperlipidemia 01/13/2006  . Uncontrolled type 2 diabetes mellitus ([place]) 12/20/2005  . TOTAL ABDOMINAL HYSTERECTOMY, HX OF 12/20/2005    Past Surgical History:  Procedure Laterality Date  . ABDOMINAL HYSTERECTOMY  1991   "w/left ovary"  . INCISION AND DRAINAGE ABSCESS ANAL  04/2004  . LEFT OOPHORECTOMY Left 1991     OB History   No obstetric history on file.     Family History  Problem Relation Age of Onset  . Coronary artery disease Father   . Diabetes Sister   . Stroke Sister     Social History   Tobacco Use  . Smoking status: Never Smoker  . Smokeless tobacco: Never Used  Vaping Use  . Vaping Use: Never used  Substance Use Topics  . Alcohol use: Not Currently    Comment: 11/28/2017 "last drink was in 2006; never had problem w/alcohol".  . Drug use: Never    Home Medications Prior to Admission medications   Medication Sig Start Date End Date Taking? Authorizing Provider  amLODipine (NORVASC) 10 MG tablet Take 1 tablet (10 mg total) by mouth daily. IM PROGRAM 03/12/20  Yes Joni Reining C, DO  insulin NPH Human (NOVOLIN N) 100 UNIT/ML injection  Inject 25-35 Units into the skin. 35 u in AM and 25 in PM   Yes [provider]  losartan-hydrochlorothiazide (HYZAAR) 100-25 MG tablet Take 1 tablet by mouth daily. 03/12/20  Yes Lucious Groves, DO  pravastatin (PRAVACHOL) 40 MG tablet Take 40 mg by mouth daily.   Yes [provider]  glucose blood (CONTOUR NEXT TEST) test strip Test blood sugar 3 times daily. IM program 06/26/17   Ledell Noss, MD  Insulin Syringe-Needle U-100 (INSULIN SYRINGE .3CC/31GX5/16") 31G X 5/16" 0.3 ML MISC 1 Dose by Does not apply route 2 (two) times daily. IM program 06/21/17   Ledell Noss, MD  MICROLET LANCETS MISC 1 Device by Does not apply  route 3 (three) times daily. 06/26/17   Ledell Noss, MD  ONE TOUCH LANCETS MISC Use to check blood sugars 08/14/12   Wilber Oliphant, MD    Allergies    Lisinopril, Bactrim [sulfamethoxazole-trimethoprim], Eggs or egg-derived products, and Shellfish allergy  Review of Systems   Review of Systems  Constitutional: Positive for fatigue. Negative for chills, diaphoresis and fever.  HENT: Negative for congestion, sore throat and trouble swallowing.   Eyes: Negative for pain and visual disturbance.  Respiratory: Negative for cough, shortness of breath and wheezing.   Cardiovascular: Negative for chest pain, palpitations and leg swelling.  Gastrointestinal: Positive for nausea and vomiting. Negative for abdominal distention, abdominal pain and diarrhea.  Genitourinary: Negative for difficulty urinating.  Musculoskeletal: Negative for back pain, neck pain and neck stiffness.  Skin: Negative for pallor.  Neurological: Positive for weakness. Negative for dizziness, speech difficulty and headaches.  Psychiatric/Behavioral: Negative for confusion.    Physical Exam Updated Vital Signs BP (!) 97/49   Pulse 87   Temp (!) 97.5 F (36.4 C) (Oral)   Resp 20   Ht 5\' 3"  (1.6 m)   Wt 77.1 kg   SpO2 100%   BMI 30.11 kg/m   Physical Exam Constitutional:      General: She is not in acute distress.    Appearance: Normal appearance. She is not ill-appearing, toxic-appearing or diaphoretic.     Comments: Patient is very sleepy on exam, will wake up and respond to questions.  Globally weak, no focal neuro deficits.  Is able speak to me, no respiratory distress.  HENT:     Mouth/Throat:     Mouth: Mucous membranes are moist.     Pharynx: Oropharynx is clear.  Eyes:     General: No scleral icterus.    Extraocular Movements: Extraocular movements intact.     Pupils: Pupils are equal, round, and reactive to light.  Cardiovascular:     Rate and Rhythm: Normal rate and regular rhythm.     Pulses:  Normal pulses.     Heart sounds: Normal heart sounds.  Pulmonary:     Effort: Pulmonary effort is normal. No respiratory distress.     Breath sounds: Normal breath sounds. No stridor. No wheezing, rhonchi or rales.  Chest:     Chest wall: No tenderness.  Abdominal:     General: Abdomen is flat. There is no distension.     Palpations: Abdomen is soft.     Tenderness: There is no abdominal tenderness. There is no guarding or rebound.  Musculoskeletal:        General: No swelling or tenderness. Normal range of motion.     Cervical back: Normal range of motion and neck supple. No rigidity.     Right lower leg: No edema.  Left lower leg: No edema.  Skin:    General: Skin is warm and dry.     Capillary Refill: Capillary refill takes less than 2 seconds.     Coloration: Skin is not pale.  Neurological:     General: No focal deficit present.     Mental Status: She is alert and oriented to person, place, and time. Mental status is at baseline.     Cranial Nerves: No cranial nerve deficit.     Sensory: No sensory deficit.     Motor: Weakness present.     Coordination: Coordination normal.     Comments: 4/5 strength in upper and lower extremities.  Psychiatric:        Mood and Affect: Mood normal.        Behavior: Behavior normal.     ED Results / Procedures / Treatments   Labs (all labs ordered are listed, but only abnormal results are displayed) Labs Reviewed  BASIC METABOLIC PANEL - Abnormal; Notable for the following components:      Result Value   Sodium 129 (*)    Potassium 6.7 (*)    Chloride 91 (*)    CO2 <7 (*)    Glucose, Bld 903 (*)    BUN 85 (*)    Creatinine, Ser 2.80 (*)    Calcium 8.4 (*)    GFR, Estimated 18 (*)    All other components within normal limits  BETA-HYDROXYBUTYRIC ACID - Abnormal; Notable for the following components:   Beta-Hydroxybutyric Acid >8.00 (*)    All other components within normal limits  CBC WITH DIFFERENTIAL/PLATELET - Abnormal;  Notable for the following components:   WBC 24.4 (*)    RBC 3.62 (*)    Hemoglobin 10.7 (*)    HCT 35.9 (*)    MCHC 29.8 (*)    Neutro Abs 22.6 (*)    Abs Immature Granulocytes 0.32 (*)    All other components within normal limits  BLOOD GAS, VENOUS - Abnormal; Notable for the following components:   pH, Ven 7.086 (*)    pCO2, Ven 19.2 (*)    pO2, Ven 64.3 (*)    Bicarbonate 5.5 (*)    Acid-base deficit 23.3 (*)    All other components within normal limits  CBG MONITORING, ED - Abnormal; Notable for the following components:   Glucose-Capillary >600 (*)    All other components within normal limits  CBG MONITORING, ED - Abnormal; Notable for the following components:   Glucose-Capillary >600 (*)    All other components within normal limits  I-STAT CHEM 8, ED - Abnormal; Notable for the following components:   Sodium 127 (*)    Potassium 6.4 (*)    BUN 82 (*)    Creatinine, Ser 2.20 (*)    Glucose, Bld >700 (*)    Calcium, Ion 1.02 (*)    TCO2 9 (*)    All other components within normal limits  CBG MONITORING, ED - Abnormal; Notable for the following components:   Glucose-Capillary >600 (*)    All other components within normal limits  CBG MONITORING, ED - Abnormal; Notable for the following components:   Glucose-Capillary >600 (*)    All other components within normal limits  RESP PANEL BY RT-PCR (FLU A&B, COVID) ARPGX2  BASIC METABOLIC PANEL  BASIC METABOLIC PANEL  BASIC METABOLIC PANEL  BETA-HYDROXYBUTYRIC ACID  URINALYSIS, ROUTINE W REFLEX MICROSCOPIC  HIV ANTIBODY (ROUTINE TESTING W REFLEX)  CBC  BASIC METABOLIC PANEL  BASIC  METABOLIC PANEL  BASIC METABOLIC PANEL  BASIC METABOLIC PANEL  BETA-HYDROXYBUTYRIC ACID  BETA-HYDROXYBUTYRIC ACID    EKG EKG Interpretation  Date/Time:  Tuesday March 24 2020 08:29:47 EST Ventricular Rate:  74 PR Interval:    QRS Duration: 92 QT Interval:  415 QTC Calculation: 461 R Axis:   29 Text Interpretation: Sinus rhythm Low  voltage, precordial leads No acute changes No old tracing to compare Confirmed by Varney Biles [phone]) on 03/24/2020 8:54:11 AM   Radiology DG Chest Port 1 View  Result Date: 03/24/2020 CLINICAL DATA:  70 year old female with weakness, hyperglycemia. EXAM: PORTABLE CHEST 1 VIEW COMPARISON:  Chest radiograph 07/05/2013. FINDINGS: Portable AP semi upright view at 0822 hours. Low lung volumes. Heart size and mediastinal contours remain within normal limits. Visualized tracheal air column is within normal limits. Allowing for portable technique the lungs are clear. No pneumothorax. Negative visible bowel gas, osseous structures. IMPRESSION: Low lung volumes. No acute cardiopulmonary abnormality. Electronically Signed   By: Genevie Ann M.D.   On: 03/24/2020 08:36    Procedures .Critical Care Performed by: Alfredia Client, PA-C Authorized by: Alfredia Client, PA-C   Critical care provider statement:    Critical care time (minutes):  45   Critical care was time spent personally by me on the following activities:  Discussions with consultants, evaluation of patient's response to treatment, examination of patient, ordering and performing treatments and interventions, ordering and review of laboratory studies, ordering and review of radiographic studies, pulse oximetry, re-evaluation of patient's condition, obtaining history from patient or surrogate and review of old charts     Medications Ordered in ED Medications  insulin regular, human (MYXREDLIN) 100 units/ 100 mL infusion (10 Units/hr Intravenous Rate/Dose Verify 03/24/20 1051)  lactated ringers infusion ( Intravenous New Bag/Given 03/24/20 0939)  dextrose 5 % in lactated ringers infusion (has no administration in time range)  dextrose 50 % solution 0-50 mL (has no administration in time range)  sodium bicarbonate 150 mEq in sterile water 1,000 mL infusion ( Intravenous New Bag/Given 03/24/20 1003)  lactated ringers bolus 1,000 mL (has no administration in  time range)  enoxaparin (LOVENOX) injection 30 mg (has no administration in time range)  lactated ringers bolus 1,542 mL (has no administration in time range)  lactated ringers bolus 1,000 mL (1,000 mLs Intravenous Bolus 03/24/20 0936)    ED Course  I have reviewed the triage vital signs and the nursing notes.  Pertinent labs & imaging results that were available during my care of the patient were reviewed by me and considered in my medical decision making (see chart for details).    MDM Rules/Calculators/A&P                         Mandy Mullen is a 70 y.o. female with past medical history of hyperlipidemia, hypertension, insulin-dependent type 2 diabetes, CKD stage III presents the emergency department today for weakness by EMS.  Patient was likely in DKA, patient is very sleepy will obtain VBG, concern for acidosis. No focal neuro deficit on exam, patient does not appear septic.  Patient's been compliant with medications.   Work-up today revealing for DKA, pH 7.086, anion gap not calculated.  Patient also does have AKI with creatinine of 2.8.  Glucose 903 on BMP.  DKA protocol initiated, patient has received 4 L of fluid at this time.  Blood pressure 98/63.  Covid negative.  Chest x-ray unremarkable.  White count 24.4, does not appear  septic, this most likely from vomiting and DKA.  Did speak to Dr. Andree Elk office who agrees.  Patient here stable, however will consult critical care due to lab values.  Bicarb drip initiated.  Does not have any EKG changes in regards to hyperkalemia.  951 spoke to Dr. Chase Caller, critical care who suggested that patient can go to [facility].  Did agree with bicarb drip, also suggested another liter of fluids.  Spoke to Dr. Manuella Ghazi, will except patient.  The patient appears reasonably stabilized for admission considering the current resources, flow, and capabilities available in the ED at this time, and I doubt any other [facility] requiring further screening  and/or treatment in the ED prior to admission.  I discussed this case with my attending physician who cosigned this note including patient's presenting symptoms, physical exam, and planned diagnostics and interventions. Attending physician stated agreement with plan or made changes to plan which were implemented.   Attending physician assessed patient at bedside.   Final Clinical Impression(s) / ED Diagnoses Final diagnoses:  Diabetic ketoacidosis without coma associated with type 2 diabetes mellitus ([place])  Acidosis, metabolic  Hyperkalemia    Rx / DC Orders ED Discharge Orders    None       Alfredia Client, PA-C 03/24/20 [place], MD 03/25/20 [phone]

## 2020-03-24 NOTE — H&P (Signed)
[facility] NAME: Mandy Mullen    MR#:  [id number]  DATE OF BIRTH:  05-18-1950  DATE OF ADMISSION:  03/24/2020  PRIMARY CARE PHYSICIAN: Lucious Groves, DO   REQUESTING/REFERRING PHYSICIAN: Alfredia Client, PA-C  Comes from home and fairly independent at baseline.  Takes care of her paraplegic son  CHIEF COMPLAINT:   Chief Complaint  Patient presents with  . Hyperglycemia    HISTORY OF PRESENT ILLNESS:  Mandy Mullen  is a 70 y.o. female with a known history of hypertension, hyperlipidemia, insulin-dependent type 2 diabetes, CKD stage IIIa is being admitted for DKA.  Patient has been feeling very weak for last 3 days.  She has been taking care of her son who is paraplegic at home.  Patient denies any one being sick at home.  Patient has not been able to eat or drink much for last 3 days.  She has been taking her insulin as prescribed.  She has been reporting nausea and vomiting over last 3 days.  EMS was called today as she was having shortness of breath and was reportedly hypoxic with oxygen saturations of 85% on room air.  She was placed on 3 L oxygen via nasal cannula.  While in the ED she was saturating 100% on room air.  ED course: On lab work she was noted to be in severe acidosis with DKA findings.  Her pH was 7.08.  Bicarb of 5.5 and CO2 less than 7.  She is started on insulin drip and bicarb drip.  PAST MEDICAL HISTORY:   Past Medical History:  Diagnosis Date  . History of blood transfusion 1991   "related to hysterectomy/left ovary OR" (11/28/2017)  . History of herpes genitalis   . Hyperlipidemia   . Hypertension   . MRSA (methicillin resistant Staphylococcus aureus)    Reccurent MRSA abscesses  . Type II diabetes mellitus ([place])    PAST SURGICAL HISTORY:   Past Surgical History:  Procedure Laterality Date  . ABDOMINAL HYSTERECTOMY  1991   "w/left ovary"  . INCISION AND DRAINAGE ABSCESS ANAL  04/2004  . LEFT OOPHORECTOMY Left 1991    SOCIAL HISTORY:   Social History   Tobacco Use  . Smoking status: Never Smoker  . Smokeless tobacco: Never Used  Substance Use Topics  . Alcohol use: Not Currently    Comment: 11/28/2017 "last drink was in 2006; never had problem w/alcohol".   FAMILY HISTORY:   Family History  Problem Relation Age of Onset  . Coronary artery disease Father   . Diabetes Sister   . Stroke Sister    DRUG ALLERGIES:   Allergies  Allergen Reactions  . Lisinopril Itching, Swelling and Rash    Angioedema and diffuse body rash  . Bactrim [Sulfamethoxazole-Trimethoprim]     Erythroderma like reaction over 80% of her body   . Eggs Or Egg-Derived Products     REACTION: Nausea. Chills/fevers x 6 months (after a flu shot in the 1990s).  Marland Kitchen Shellfish Allergy    REVIEW OF SYSTEMS:  Review of Systems  Constitutional: Positive for malaise/fatigue. Negative for diaphoresis, fever and weight loss.  HENT: Negative for ear discharge, ear pain, hearing loss, nosebleeds, sore throat and tinnitus.   Eyes: Negative for blurred vision and pain.  Respiratory: Positive for shortness of breath. Negative for cough, hemoptysis and wheezing.   Cardiovascular: Negative for chest pain, palpitations, orthopnea and leg swelling.  Gastrointestinal: Positive for nausea and vomiting. Negative for abdominal  pain, blood in stool, constipation, diarrhea and heartburn.  Genitourinary: Negative for dysuria, frequency and urgency.  Musculoskeletal: Negative for back pain and myalgias.  Skin: Negative for itching and rash.  Neurological: Negative for dizziness, tingling, tremors, focal weakness, seizures, weakness and headaches.  Psychiatric/Behavioral: Negative for depression. The patient is not nervous/anxious.    MEDICATIONS AT HOME:   Prior to Admission medications   Medication Sig Start Date End Date Taking? Authorizing Provider  amLODipine (NORVASC) 10 MG tablet Take 1 tablet (10 mg total) by mouth daily. IM PROGRAM  03/12/20  Yes Lucious Groves, DO  insulin NPH Human (NOVOLIN N) 100 UNIT/ML injection Inject 25-35 Units into the skin. 35 u in AM and 25 in PM   Yes [provider]  losartan-hydrochlorothiazide (HYZAAR) 100-25 MG tablet Take 1 tablet by mouth daily. 03/12/20  Yes Lucious Groves, DO  pravastatin (PRAVACHOL) 40 MG tablet Take 40 mg by mouth daily.   Yes [provider]  glucose blood (CONTOUR NEXT TEST) test strip Test blood sugar 3 times daily. IM program 06/26/17   Ledell Noss, MD  Insulin Syringe-Needle U-100 (INSULIN SYRINGE .3CC/31GX5/16") 31G X 5/16" 0.3 ML MISC 1 Dose by Does not apply route 2 (two) times daily. IM program 06/21/17   Ledell Noss, MD  MICROLET LANCETS MISC 1 Device by Does not apply route 3 (three) times daily. 06/26/17   Ledell Noss, MD  ONE TOUCH LANCETS MISC Use to check blood sugars 08/14/12   Wilber Oliphant, MD    VITAL SIGNS:  Blood pressure (!) 90/43, pulse 82, temperature (!) 97.5 F (36.4 C), temperature source Oral, resp. rate (!) 21, height 5\' 3"  (1.6 m), weight 77.1 kg, SpO2 100 %. PHYSICAL EXAMINATION:  Physical Exam  GENERAL:  70 y.o.-year-old patient lying in the bed, looks critically sick.  EYES: Pupils equal, round, reactive to light and accommodation. No scleral icterus. Extraocular muscles intact.  HEENT: Head atraumatic, normocephalic. Oropharynx and nasopharynx clear.  NECK:  Supple, no jugular venous distention. No thyroid enlargement, no tenderness.  LUNGS: Decreased breath sounds bilaterally, no wheezing, rales,rhonchi or crepitation. No use of accessory muscles of respiration.  CARDIOVASCULAR: S1, S2 normal. No murmurs, rubs, or gallops.  ABDOMEN: Soft, nontender, nondistended. Bowel sounds present. No organomegaly or mass.  EXTREMITIES: No pedal edema, cyanosis, or clubbing.  NEUROLOGIC: Cranial nerves II through XII are intact. Muscle strength 5/5 in all extremities. Sensation intact. Gait not checked.  PSYCHIATRIC: The patient  is alert and oriented x 3.  Patient is very tired looking. SKIN: No obvious rash, lesion, or ulcer.  LABORATORY PANEL:   CBC Recent Labs  Lab 03/24/20 0835 03/24/20 0845  WBC 24.4*  --   HGB 10.7* 12.2  HCT 35.9* 36.0  PLT 325  --    ------------------------------------------------------------------------------------------------------------------  Chemistries  Recent Labs  Lab 03/24/20 0835 03/24/20 0845  NA 129* 127*  K 6.7* 6.4*  CL 91* 98  CO2 <7*  --   GLUCOSE 903* >700*  BUN 85* 82*  CREATININE 2.80* 2.20*  CALCIUM 8.4*  --    ------------------------------------------------------------------------------------------------------------------  Cardiac Enzymes No results for input(s): TROPONINI in the last 168 hours. ------------------------------------------------------------------------------------------------------------------  RADIOLOGY:  DG Chest Port 1 View  Result Date: 03/24/2020 CLINICAL DATA:  70 year old female with weakness, hyperglycemia. EXAM: PORTABLE CHEST 1 VIEW COMPARISON:  Chest radiograph 07/05/2013. FINDINGS: Portable AP semi upright view at 0822 hours. Low lung volumes. Heart size and mediastinal contours remain within normal limits. Visualized tracheal air column  is within normal limits. Allowing for portable technique the lungs are clear. No pneumothorax. Negative visible bowel gas, osseous structures. IMPRESSION: Low lung volumes. No acute cardiopulmonary abnormality. Electronically Signed   By: Genevie Ann M.D.   On: 03/24/2020 08:36   IMPRESSION AND PLAN:  70 y.o. female with a known history of hypertension, hyperlipidemia, insulin-dependent type 2 diabetes, CKD stage IIIa is being admitted for DKA.  Severe DKA -present on admission Admit to stepdown Start DKA protocol -Continue insulin and bicarb drip as patient is severely acidotic. -Diabetic nurse consult -Case discussed with Dr. Chase Caller who will see her as a consult in the  ICU  Hypovolemic shock History of essential hypertension Likely due to severe dehydration Aggressive IV hydration and monitor blood pressure Consider pressors if need Holding all blood pressure medicine  Severe hyponatremia Likely from ongoing nausea vomiting and resulting severe dehydration Sodium 127 Aggressive hydration and close monitoring of electrolytes We will consult pharmacy for electrolyte management while in ICU/stepdown  Generalized weakness Likely due to DKA  Hyperlipidemia Patient takes pravastatin at home when the Crestor is listed.  Patient has not failed Crestor yet.  Considering her critical illness I am holding statin for now  Acute kidney injury with hyperkalemia CKD stage IIIa due to longstanding hypertension and diabetes Hold all blood pressure medicine and avoid nephrotoxic medications Likely prerenal.  Monitor renal function   Patient looks critically sick and high risk for clinical decompensation with severe electrolyte abnormalities and severe acidosis.  Will need close monitoring in stepdown.  Case discussed with intensivist who will see her in the ICU/stepdown.   All the records are reviewed and case discussed with ED provider. Management plans discussed with the patient, nursing, intensivist and they are in agreement.  CODE STATUS: Full code  TOTAL TIME TAKING CARE OF THIS PATIENT: 45 minutes.    Max Sane M.D on 03/24/2020 at 10:43 AM  [facility]   CC: Primary care physician; Lucious Groves, DO   Note: This dictation was prepared with Dragon dictation along with smaller phrase technology. Any transcriptional errors that result from this process are unintentional.

## 2020-03-24 NOTE — Progress Notes (Signed)
Last BMP resulted around 0830. Lab called by this RN, and 1600 BMP still in process. Phlebotomist is currently drawing scheduled labs on pt. This RN will continue to carefully monitor pt.

## 2020-03-24 NOTE — Progress Notes (Signed)
1600 labs are still in process, awaiting results. Labs were overdue when admitted to ICU/SD.

## 2020-03-24 NOTE — Consult Note (Signed)
NAME:  Mandy Mullen, MRN:  [id number], DOB:  1950-03-11, LOS: 0 ADMISSION DATE:  03/24/2020, CONSULTATION DATE:  03/24/2020  REFERRING MD:  Dr Carlynn Spry of [facility], CHIEF COMPLAINT:  DKA   Brief History:  DKA  History of Present Illness:  70-year-old female with a history of hypertension, hyperlipidemia, type 2 diabetes CKD stage III  .  According to the hospitalist she has been feeling extremely weak for 3 days.  She has consulted with critical care medicine.  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  Decreased p.o. intake in the last 3 days.  Good compliance with insulin per history.  At admission her blood sugar was 900 with a creatinine of 2.8 mg percent, potassium was 6.7 bicarb of less than 7 sodium of 129.  At the time of critical care medicine evaluation for consultation several hours later she was not lethargic but quite awake.  She is complaining of shortness of breath.  But is saturating well on room air.  Hospitalist admitting for DKA.  Unclear trigger factor    Past Medical History:    has a past medical history of History of blood transfusion (1991), History of herpes genitalis, Hyperlipidemia, Hypertension, MRSA (methicillin resistant Staphylococcus aureus), and Type II diabetes mellitus ([place]).   reports that she has never smoked. She has never used smokeless tobacco.  Past Surgical History:  Procedure Laterality Date  . ABDOMINAL HYSTERECTOMY  1991   "w/left ovary"  . INCISION AND DRAINAGE ABSCESS ANAL  04/2004  . LEFT OOPHORECTOMY Left 1991    Allergies  Allergen Reactions  . Lisinopril Itching, Swelling and Rash    Angioedema and diffuse body rash  . Bactrim [Sulfamethoxazole-Trimethoprim]     Erythroderma like reaction over 80% of her body   . Eggs Or Egg-Derived Products     REACTION: Nausea. Chills/fevers x 6 months (after a flu shot in the 1990s).  . Shellfish Allergy     Immunization History  Administered Date(s) Administered  . Influenza Inj Mdck Quad  Pf 12/27/2018  . Moderna Sars-Covid-2 Vaccination 02/28/2019, 03/28/2019, 12/05/2019  . Pneumococcal Polysaccharide-23 02/07/2008, 11/30/2017  . Tdap 06/25/2013, 11/25/2017    Family History  Problem Relation Age of Onset  . Coronary artery disease Father   . Diabetes Sister   . Stroke Sister      Current Facility-Administered Medications:  .  dextrose 5 % in lactated ringers infusion, , Intravenous, Continuous, Patel, Shalyn, PA-C .  dextrose 50 % solution 0-50 mL, 0-50 mL, Intravenous, PRN, Posey Pronto, Shalyn, PA-C .  enoxaparin (LOVENOX) injection 30 mg, 30 mg, Subcutaneous, Q24H, Shah, Vipul, MD .  insulin regular, human (MYXREDLIN) 100 units/ 100 mL infusion, , Intravenous, Continuous, Patel, [place], PA-C, Last Rate: 10 mL/hr at 03/24/20 1134, 10 Units/hr at 03/24/20 1134 .  lactated ringers infusion, , Intravenous, Continuous, Alfredia Client, PA-C, Last Rate: 125 mL/hr at 03/24/20 0939, New Bag at 03/24/20 0939 .  sodium bicarbonate 150 mEq in sterile water 1,000 mL infusion, , Intravenous, Continuous, Carmin Muskrat, MD, Last Rate: 125 mL/hr at 03/24/20 1003, New Bag at 03/24/20 1003  Current Outpatient Medications:  .  amLODipine (NORVASC) 10 MG tablet, Take 1 tablet (10 mg total) by mouth daily. IM PROGRAM, Disp: 90 tablet, Rfl: 3 .  insulin NPH Human (NOVOLIN N) 100 UNIT/ML injection, Inject 25-35 Units into the skin. 35 u in AM and 25 in PM, Disp: , Rfl:  .  losartan-hydrochlorothiazide (HYZAAR) 100-25 MG tablet, Take 1 tablet by  mouth daily., Disp: 90 tablet, Rfl: 3 .  pravastatin (PRAVACHOL) 40 MG tablet, Take 40 mg by mouth daily., Disp: , Rfl:  .  glucose blood (CONTOUR NEXT TEST) test strip, Test blood sugar 3 times daily. IM program, Disp: 100 each, Rfl: 11 .  Insulin Syringe-Needle U-100 (INSULIN SYRINGE .3CC/31GX5/16") 31G X 5/16" 0.3 ML MISC, 1 Dose by Does not apply route 2 (two) times daily. IM program, Disp: 60 each, Rfl: 0 .  MICROLET LANCETS MISC, 1 Device by Does  not apply route 3 (three) times daily., Disp: 100 each, Rfl: 11 .  ONE TOUCH LANCETS MISC, Use to check blood sugars, Disp: 200 each, Rfl: 5   Significant Hospital Events:  03/24/2020 - admit   Consults:  03/24/2020: CCM consultation  Procedures:  x  Significant Diagnostic Tests:  x  Micro Data:  03/24/2020: Flu PCR and Covid PCR negative  Antimicrobials:   none  Interim History / Subjective:   03/24/2020 - seen in [place], ER bed 5  Objective   Blood pressure (!) 97/49, pulse 87, temperature (!) 97.5 F (36.4 C), temperature source Oral, resp. rate 20, height 5\' 3"  (1.6 m), weight 77.1 kg, SpO2 100 %.       No intake or output data in the 24 hours ending 03/24/20 1144 Filed Weights   03/24/20 0800  Weight: 77.1 kg     Examination: General: looks stble HENT: no neck nodes Lungs: CTA bilaterally Cardiovascular: nHR 70s Abdomen: Soft, Non tender Extremities: intact Neuro: AxOx3. Moves all 4 extremeities GU: Not examned  Resolved Hospital Problem list   x  Assessment & Plan:  ASSESSMENT / PLAN:  A:   Primary problem of DKA P:   DKA protocol    A:  No respiratory distress but complains of shortness of breath  03/24/2020 -> pulse ox 100% on room air without tachypnea.  Chest x-ray clear  P:   Duplex lower extremities surrogate marker for PE [although low from]   A:   Was drowsy from DKA but has improved P:   Monitor     A:   At risk for hypotension.  Had soft blood pressure but has improved with fluid correction  P:  Mean arterial pressure goal greater than 65   A: No evidence of structural heart disease in the past but is at risk  P: Serial troponin Check echo      A:   No evidence of infection precipitating DKA P:   Check procalcitonin Check lactic acid    A:  AKI on CKD due to DKA improving with severe acidosis P:  Recheck   A:  Hyponatremia in relation to hyperglycemia and hyperkalemia because of  DKA P: Closely monitor his DKA protocol and correct as needed Check magnesium and phosphorus 03/25/2020     A:  Anemia of critical illness/chronic disease   P:  - PRBC for hgb </= 6.9gm%    - exceptions are   -  if ACS susepcted/confirmed then transfuse for hgb </= 8.0gm%,  or    -  active bleeding with hemodynamic instability, then transfuse regardless of hemoglobin value   At at all times try to transfuse 1 unit prbc as possible with exception of active hemorrhage    A At risk for DVT and also thrombocytopenia  P Lovenox per [facility] protocol   Best practice (evaluated daily)  Diet: per [facility] Pain/Anxiety/Delirium protocol (if indicated): per [facility] VAP protocol (if indicated): x DVT prophylaxis: per [facility] GI  prophylaxis: per [facility] Glucose control: dka protocol Mobility: bed rest Disposition:to sdu from er  Goals of Care:  Family Updates: per [facility]    ATTESTATION & SIGNATURE   The patient Mandy Mullen is critically ill with multiple organ systems failure and requires high complexity decision making for assessment and support, frequent evaluation and titration of therapies, application of advanced monitoring technologies and extensive interpretation of multiple databases.   Critical Care Time devoted to patient care services described in this note is  45  Minutes. This time reflects time of care of this signee Dr Brand Males. This critical care time does not reflect procedure time, or teaching time or supervisory time of PA/NP/Med student/Med Resident etc but could involve care discussion time     Dr. Brand Males, M.D., [facility].C.P Pulmonary and Critical Care Medicine Staff Physician [facility] Pager: [phone], If no answer or between  15:00h - 7:00h: call [phone]  03/24/2020 11:44 AM    LABS    PULMONARY Recent Labs  Lab 03/24/20 0832 03/24/20 0845  HCO3 5.5*  --   TCO2  --  9*  O2SAT  82.7  --     CBC Recent Labs  Lab 03/24/20 0835 03/24/20 0845  HGB 10.7* 12.2  HCT 35.9* 36.0  WBC 24.4*  --   PLT 325  --     COAGULATION No results for input(s): INR in the last 168 hours.  CARDIAC  No results for input(s): TROPONINI in the last 168 hours. No results for input(s): PROBNP in the last 168 hours.   CHEMISTRY Recent Labs  Lab 03/24/20 0835 03/24/20 0845  NA 129* 127*  K 6.7* 6.4*  CL 91* 98  CO2 <7*  --   GLUCOSE 903* >700*  BUN 85* 82*  CREATININE 2.80* 2.20*  CALCIUM 8.4*  --    Estimated Creatinine Clearance: 24.1 mL/min (A) (by C-G formula based on SCr of 2.2 mg/dL (H)).   LIVER No results for input(s): AST, ALT, ALKPHOS, BILITOT, PROT, ALBUMIN, INR in the last 168 hours.   INFECTIOUS No results for input(s): LATICACIDVEN, PROCALCITON in the last 168 hours.   ENDOCRINE CBG (last 3)  Recent Labs    03/24/20 1023 03/24/20 1048 03/24/20 1132  GLUCAP >600* >600* >600*         IMAGING x48h  - image(s) personally visualized  -   highlighted in bold DG Chest Port 1 View  Result Date: 03/24/2020 CLINICAL DATA:  70 year old female with weakness, hyperglycemia. EXAM: PORTABLE CHEST 1 VIEW COMPARISON:  Chest radiograph 07/05/2013. FINDINGS: Portable AP semi upright view at 0822 hours. Low lung volumes. Heart size and mediastinal contours remain within normal limits. Visualized tracheal air column is within normal limits. Allowing for portable technique the lungs are clear. No pneumothorax. Negative visible bowel gas, osseous structures. IMPRESSION: Low lung volumes. No acute cardiopulmonary abnormality. Electronically Signed   By: Genevie Ann M.D.   On: 03/24/2020 08:36

## 2020-03-24 NOTE — ED Notes (Signed)
Consult for IV team placed.  

## 2020-03-24 NOTE — Progress Notes (Signed)
  Echocardiogram 2D Echocardiogram has been performed.  Mandy Mullen 03/24/2020, 3:36 PM

## 2020-03-24 NOTE — Plan of Care (Signed)
Pt is currently still on the insulin gtt with GBGs trending down. Pt is still unsure how her glucose was so elevated and reports taking her insulin and medications as scheduled. This RN will continue to carefully monitor pt and progression of care plan.

## 2020-03-24 NOTE — ED Notes (Signed)
Pts CBG due at 1252

## 2020-03-24 NOTE — ED Notes (Signed)
Pt placed on RA- O2 sat 100% on RA at this time

## 2020-03-24 NOTE — CV Procedure (Signed)
BLE venous duplex completed.  Results can be found under chart review under CV PROC. 03/24/2020 3:30 PM Kortland Nichols RVT, RDMS

## 2020-03-24 NOTE — Progress Notes (Signed)
I had requested labs since admission and still pending lab results. D/w nursing and they're aware and working on it.

## 2020-03-25 DIAGNOSIS — E1165 Type 2 diabetes mellitus with hyperglycemia: Secondary | ICD-10-CM

## 2020-03-25 LAB — BASIC METABOLIC PANEL
Anion gap: 10 (ref 5–15)
Anion gap: 9 (ref 5–15)
BUN: 72 mg/dL — ABNORMAL HIGH (ref 8–23)
BUN: 64 mg/dL — ABNORMAL HIGH (ref 8–23)
CO2: 27 mmol/L (ref 22–32)
CO2: 26 mmol/L (ref 22–32)
Calcium: 8 mg/dL — ABNORMAL LOW (ref 8.9–10.3)
Calcium: 8.1 mg/dL — ABNORMAL LOW (ref 8.9–10.3)
Chloride: 99 mmol/L (ref 98–111)
Chloride: 101 mmol/L (ref 98–111)
Creatinine, Ser: 1.72 mg/dL — ABNORMAL HIGH (ref 0.44–1.00)
Creatinine, Ser: 1.63 mg/dL — ABNORMAL HIGH (ref 0.44–1.00)
GFR, Estimated: 32 mL/min — ABNORMAL LOW (ref >60)
GFR, Estimated: 34 mL/min — ABNORMAL LOW (ref >60)
Glucose, Bld: 145 mg/dL — ABNORMAL HIGH (ref 70–99)
Glucose, Bld: 143 mg/dL — ABNORMAL HIGH (ref 70–99)
Potassium: 3.7 mmol/L (ref 3.5–5.1)
Potassium: 3.6 mmol/L (ref 3.5–5.1)
Sodium: 136 mmol/L (ref 135–145)
Sodium: 136 mmol/L (ref 135–145)

## 2020-03-25 LAB — GLUCOSE, CAPILLARY
Glucose-Capillary: 106 mg/dL — ABNORMAL HIGH (ref 70–99)
Glucose-Capillary: 107 mg/dL — ABNORMAL HIGH (ref 70–99)
Glucose-Capillary: 130 mg/dL — ABNORMAL HIGH (ref 70–99)
Glucose-Capillary: 137 mg/dL — ABNORMAL HIGH (ref 70–99)
Glucose-Capillary: 138 mg/dL — ABNORMAL HIGH (ref 70–99)
Glucose-Capillary: 152 mg/dL — ABNORMAL HIGH (ref 70–99)
Glucose-Capillary: 67 mg/dL — ABNORMAL LOW (ref 70–99)
Glucose-Capillary: 91 mg/dL (ref 70–99)

## 2020-03-25 LAB — BETA-HYDROXYBUTYRIC ACID
Beta-Hydroxybutyric Acid: 0.43 mmol/L — ABNORMAL HIGH (ref 0.05–0.27)
Beta-Hydroxybutyric Acid: 0.49 mmol/L — ABNORMAL HIGH (ref 0.05–0.27)

## 2020-03-25 LAB — HEPATIC FUNCTION PANEL
ALT: 34 U/L (ref 0–44)
AST: 24 U/L (ref 15–41)
Albumin: 2.7 g/dL — ABNORMAL LOW (ref 3.5–5.0)
Alkaline Phosphatase: 77 U/L (ref 38–126)
Bilirubin, Direct: 0.1 mg/dL (ref 0.0–0.2)
Indirect Bilirubin: 0.4 mg/dL (ref 0.3–0.9)
Total Bilirubin: 0.5 mg/dL (ref 0.3–1.2)
Total Protein: 5 g/dL — ABNORMAL LOW (ref 6.5–8.1)

## 2020-03-25 LAB — PHOSPHORUS: Phosphorus: 1.9 mg/dL — ABNORMAL LOW (ref 2.5–4.6)

## 2020-03-25 LAB — MAGNESIUM: Magnesium: 2.4 mg/dL (ref 1.7–2.4)

## 2020-03-25 MED ORDER — LACTATED RINGERS IV BOLUS
1000.0000 mL | Freq: Once | INTRAVENOUS | Status: AC
  Administered 2020-03-25: 1000 mL via INTRAVENOUS

## 2020-03-25 MED ORDER — INSULIN NPH (HUMAN) (ISOPHANE) 100 UNIT/ML ~~LOC~~ SUSP
25.0000 [IU] | Freq: Every day | SUBCUTANEOUS | Status: DC
  Administered 2020-03-25: 25 [IU] via SUBCUTANEOUS

## 2020-03-25 MED ORDER — INSULIN NPH (HUMAN) (ISOPHANE) 100 UNIT/ML ~~LOC~~ SUSP
35.0000 [IU] | Freq: Every day | SUBCUTANEOUS | Status: DC
  Administered 2020-03-25: 35 [IU] via SUBCUTANEOUS
  Filled 2020-03-25: qty 10

## 2020-03-25 MED ORDER — INSULIN ASPART 100 UNIT/ML ~~LOC~~ SOLN
0.0000 [IU] | Freq: Every day | SUBCUTANEOUS | Status: DC
  Administered 2020-03-27: 2 [IU] via SUBCUTANEOUS

## 2020-03-25 MED ORDER — INSULIN ASPART 100 UNIT/ML ~~LOC~~ SOLN
0.0000 [IU] | Freq: Three times a day (TID) | SUBCUTANEOUS | Status: DC
  Administered 2020-03-26: 5 [IU] via SUBCUTANEOUS
  Administered 2020-03-27: 7 [IU] via SUBCUTANEOUS
  Administered 2020-03-27: 1 [IU] via SUBCUTANEOUS
  Administered 2020-03-28 (×2): 3 [IU] via SUBCUTANEOUS

## 2020-03-25 MED ORDER — PRAVASTATIN SODIUM 20 MG PO TABS
40.0000 mg | ORAL_TABLET | Freq: Every day | ORAL | Status: DC
Start: 2020-03-25 — End: 2020-03-28
  Administered 2020-03-25 – 2020-03-28 (×4): 40 mg via ORAL
  Filled 2020-03-25 (×4): qty 2

## 2020-03-25 NOTE — Progress Notes (Signed)
Inpatient Diabetes Program Recommendations  AACE/ADA: New Consensus Statement on Inpatient Glycemic Control (2015)  Target Ranges:  Prepandial:   less than 140 mg/dL      Peak postprandial:   less than 180 mg/dL (1-2 hours)      Critically ill patients:  140 - 180 mg/dL   Lab Results  Component Value Date   GLUCAP 106 (H) 03/25/2020   HGBA1C 13.6 (A) 03/12/2020    Review of Glycemic Control  Diabetes history: DM2 Outpatient Diabetes medications: NPH 35 units am + 25 units pm Current orders for Inpatient glycemic control: NPH 35 units am + 25 units pm + Novolog 0-9 units tid + hs 0-5 units  Inpatient Diabetes Program Recommendations:   Spoke with pt about  A1C results 13.6 (average )with them and explained what an A1C is, basic pathophysiology of DM Type 2, basic home care, basic diabetes diet nutrition principles, importance of checking CBGs and maintaining good CBG control to prevent long-term and short-term complications. Reviewed signs and symptoms of hyperglycemia and hypoglycemia and how to treat hypoglycemia at home. Also reviewed blood sugar goals at home.  RNs to provide ongoing basic DM education at bedside with this patient. Have ordered educational booklet.  Patient was on Metformin in the past and had a rash reaction.  Patient has also been on 70/30 insulin in the past without much difference shown in CBGs. Last office visit and A1c of 13.6, patient prescribed to increase NPH 5 units am & pm.  Thank you, Nani Gasser. Hanks, RN, MSN, CDE  Diabetes Coordinator Inpatient Glycemic Control Team Team Pager [phone] (8am-5pm) 03/25/2020 12:17 PM

## 2020-03-25 NOTE — Progress Notes (Signed)
NUTRITION NOTE RD working remotely.  RD consulted for nutrition education regarding diabetes.   Lab Results  Component Value Date   HGBA1C 13.6 (A) 03/12/2020   RD is working off campus today. Will place "Carbohydrate Counting for People with Diabetes" handout from the Academy of Nutrition and Dietetics in Discharge Instructions.   Will place order for outpatient DM diet counseling.    Body mass index is 30.73 kg/m. Pt meets criteria for obesity based on current BMI.  Current diet order is Heart Healthy/Carb Modified. No meal intakes have been documented since admission.  Labs reviewed; CBGs: 106-152 mg/dl. Medications reviewed; sliding scale novolog, 25 units novolin once/day, 35 units novolin once/day.   No further nutrition interventions warranted at this time. RD contact information provided. If additional nutrition issues arise, please re-consult RD.      Mandy Matin, MS, RD, LDN, CNSC Inpatient Clinical Dietitian RD pager # available in [place]  After hours/weekend pager # available in [facility]

## 2020-03-25 NOTE — Discharge Instructions (Signed)

## 2020-03-25 NOTE — TOC Initial Note (Signed)
Transition of Care [facility]) - Initial/Assessment Note    Patient Details  Name: Mandy Mullen MRN: [id number] Date of Birth: 04-19-1950  Transition of Care [facility]) CM/SW Contact:    Leeroy Cha, RN Phone Number: 03/25/2020, 8:46 AM  Clinical Narrative:                 70 year old female with a history of hypertension, hyperlipidemia, type 2 diabetes CKD stage III  .  According to the hospitalist she has been feeling extremely weak for 3 days.  She has consulted with critical care medicine.  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  Decreased p.o. intake in the last 3 days.  Good compliance with insulin per history.  At admission her blood sugar was 900 with a creatinine of 2.8 mg percent, potassium was 6.7 bicarb of less than 7 sodium of 129.  At the time of critical care medicine evaluation for consultation several hours later she was not lethargic but quite awake.  She is complaining of shortness of breath.  But is saturating well on room air.  Hospitalist admitting for DKA.  Unclear trigger factor PLAN: to return to home with self care.  Expected Discharge Plan: Home/Self Care Barriers to Discharge: Continued Medical Work up   Patient Goals and CMS Choice Patient states their goals for this hospitalization and ongoing recovery are:: to go back home well CMS Medicare.gov Compare Post Acute Care list provided to:: Patient    Expected Discharge Plan and Services Expected Discharge Plan: Home/Self Care   Discharge Planning Services: CM Consult   Living arrangements for the past 2 months: Single Family Home                                      Prior Living Arrangements/Services Living arrangements for the past 2 months: Single Family Home Lives with:: Self Patient language and need for interpreter reviewed:: Yes Do you feel safe going back to the place where you live?: Yes      Need for Family Participation in Patient Care: Yes (Comment) (significant other) Care  giver support system in place?: Yes (comment)   Criminal Activity/Legal Involvement Pertinent to Current Situation/Hospitalization: No - Comment as needed  Activities of Daily Living Home Assistive Devices/Equipment: CBG Meter,Eyeglasses ADL Screening (condition at time of admission) Patient's cognitive ability adequate to safely complete daily activities?: Yes Is the patient deaf or have difficulty hearing?: No Does the patient have difficulty seeing, even when wearing glasses/contacts?: No Does the patient have difficulty concentrating, remembering, or making decisions?: No Patient able to express need for assistance with ADLs?: Yes Does the patient have difficulty dressing or bathing?: Yes Independently performs ADLs?: No Communication: Independent Dressing (OT): Needs assistance Is this a change from baseline?: Change from baseline, expected to last >3 days Grooming: Independent Feeding: Independent Bathing: Needs assistance Is this a change from baseline?: Change from baseline, expected to last >3 days Toileting: Needs assistance Is this a change from baseline?: Change from baseline, expected to last >3days In/Out Bed: Needs assistance Is this a change from baseline?: Change from baseline, expected to last >3 days Walks in Home: Needs assistance Is this a change from baseline?: Change from baseline, expected to last >3 days Does the patient have difficulty walking or climbing stairs?: Yes (secondary to weakness) Weakness of Legs: Both Weakness of Arms/Hands: None  Permission Sought/Granted  Emotional Assessment Appearance:: Appears stated age Attitude/Demeanor/Rapport: Engaged Affect (typically observed): Calm Orientation: : Oriented to Self,Oriented to Place,Oriented to  Time,Oriented to Situation Alcohol / Substance Use: Not Applicable Psych Involvement: No (comment)  Admission diagnosis:  Hyperkalemia [E87.5] Acidosis, metabolic  [B92.1] Diabetic keto-acidosis ([place]) [E11.10] Diabetic ketoacidosis without coma associated with type 2 diabetes mellitus ([place]) [E11.10] Patient Active Problem List   Diagnosis Date Noted  . Diabetic keto-acidosis ([place]) 03/24/2020  . Normocytic anemia 04/24/2019  . 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 CKD stage 3 due to type 2 diabetes mellitus ([place]) 07/29/2016  . Healthcare maintenance 02/26/2014  . Hypertension associated with diabetes ([place]) 12/17/2007  . Hyperlipidemia 01/13/2006  . Uncontrolled type 2 diabetes mellitus ([place]) 12/20/2005  . TOTAL ABDOMINAL HYSTERECTOMY, HX OF 12/20/2005   PCP:  Lucious Groves, DO Pharmacy:   [place], [place] - [address] [place] [address] Phone: [phone] Fax: [phone]     Social Determinants of Health (SDOH) Interventions    Readmission Risk Interventions No flowsheet data found.

## 2020-03-25 NOTE — Progress Notes (Signed)
PROGRESS NOTE    Mandy Mullen  GYF:[id number] DOB: November 24, 1950 DOA: 03/24/2020 PCP: Lucious Groves, DO    Chief Complaint  Patient presents with  . Hyperglycemia    Brief Narrative: 70 y.o. female with a known history of hypertension, hyperlipidemia, insulin-dependent type 2 diabetes, CKD stage IIIa is being admitted for DKA. Assessment & Plan:   Active Problems:   Diabetic keto-acidosis ([place])   Severe DKA Resolved She was initially started on GTT and transition to NovoLog 70/30. CBG (last 3)  Recent Labs    03/25/20 0248 03/25/20 0819 03/25/20 1209  GLUCAP 152* 106* 107*   Continue with sliding scale insulin in addition to the NovoLog 70/30. Bicarb is 26, anion gap is closed Beta hydroxybutyric acid is 0.49, CBGs have been less than 200.    Hypomagnesemia Replace   Hypertension Blood pressure parameters appear to be optimal    Acute on stage IIIa CKD probably secondary to DKA Improving with IV fluids.   Recommend PT evaluation and transfer the patient to regular MedSurg floor       DVT prophylaxis: lovenox.  Code Status: Full code Family Communication: none at bedside.  Disposition:   Status is: Inpatient  Remains inpatient appropriate because:IV treatments appropriate due to intensity of illness or inability to take PO   Dispo: The patient is from: Home              Anticipated d/c is to: Home              Patient currently is not medically stable to d/c.   Difficult to place patient No       Consultants:   none   Procedures: none.    Antimicrobials: none.    Subjective: Reports feeling better, dizziness improved, but not resolved.   Objective: Vitals:   03/25/20 0900 03/25/20 1000 03/25/20 1100 03/25/20 1200  BP: (!) 103/49 132/63 (!) 115/59 127/61  Pulse: 85 92 86 87  Resp: 13 15 14 20   Temp:      TempSrc:      SpO2: 95% 96% 97% 97%  Weight:      Height:        Intake/Output Summary (Last 24 hours) at  03/25/2020 1355 Last data filed at 03/25/2020 0800 Gross per 24 hour  Intake 4526.43 ml  Output 800 ml  Net 3726.43 ml   Filed Weights   03/24/20 0800 03/24/20 1426  Weight: 77.1 kg 78.7 kg    Examination:  General exam: Appears calm and comfortable  Respiratory system: Clear to auscultation. Respiratory effort normal. Cardiovascular system: S1 & S2 heard, RRR. No JVD,  No pedal edema. Gastrointestinal system: Abdomen is nondistended, soft and nontender.Normal bowel sounds heard. Central nervous system: Alert and oriented. No focal neurological deficits. Extremities: Symmetric 5 x 5 power. Skin: No rashes, lesions or ulcers Psychiatry:  Mood & affect appropriate.     Data Reviewed: I have personally reviewed following labs and imaging studies  CBC: Recent Labs  Lab 03/24/20 0835 03/24/20 0845  WBC 24.4*  --   NEUTROABS 22.6*  --   HGB 10.7* 12.2  HCT 35.9* 36.0  MCV 99.2  --   PLT 325  --     Basic Metabolic Panel: Recent Labs  Lab 03/24/20 0835 03/24/20 0845 03/24/20 1832 03/24/20 2031 03/25/20 0025 03/25/20 0430  NA 129* 127* 134* 136 136 136  K 6.7* 6.4* 3.9 3.6 3.7 3.6  CL 91* 98 96* 99 99 101  CO2 <  7*  --  24 25 27 26   GLUCOSE 903* >700* 367* 240* 145* 143*  BUN 85* 82* 61* 69* 72* 64*  CREATININE 2.80* 2.20* 2.15* 1.88* 1.72* 1.63*  CALCIUM 8.4*  --  8.2* 8.1* 8.0* 8.1*  MG  --   --   --   --   --  2.4  PHOS  --   --   --   --   --  1.9*    GFR: Estimated Creatinine Clearance: 32.8 mL/min (A) (by C-G formula based on SCr of 1.63 mg/dL (H)).  Liver Function Tests: Recent Labs  Lab 03/25/20 0430  AST 24  ALT 34  ALKPHOS 77  BILITOT 0.5  PROT 5.0*  ALBUMIN 2.7*    CBG: Recent Labs  Lab 03/25/20 0025 03/25/20 0131 03/25/20 0248 03/25/20 0819 03/25/20 1209  GLUCAP 137* 130* 152* 106* 107*     Recent Results (from the past 240 hour(s))  Resp Panel by RT-PCR (Flu A&B, Covid) Nasopharyngeal Swab     Status: None   Collection Time:  03/24/20  8:35 AM   Specimen: Nasopharyngeal Swab; Nasopharyngeal(NP) swabs in vial transport medium  Result Value Ref Range Status   SARS Coronavirus 2 by RT PCR NEGATIVE NEGATIVE Final    Comment: (NOTE) SARS-CoV-2 target nucleic acids are NOT DETECTED.  The SARS-CoV-2 RNA is generally detectable in upper respiratory specimens during the acute phase of infection. The lowest concentration of SARS-CoV-2 viral copies this assay can detect is 138 copies/mL. A negative result does not preclude SARS-Cov-2 infection and should not be used as the sole basis for treatment or other patient management decisions. A negative result may occur with  improper specimen collection/handling, submission of specimen other than nasopharyngeal swab, presence of viral mutation(s) within the areas targeted by this assay, and inadequate number of viral copies(<138 copies/mL). A negative result must be combined with clinical observations, patient history, and epidemiological information. The expected result is Negative.  Fact Sheet for Patients:  EntrepreneurPulse.com.au  Fact Sheet for Healthcare Providers:  IncredibleEmployment.be  This test is no t yet approved or cleared by the [place] FDA and  has been authorized for detection and/or diagnosis of SARS-CoV-2 by FDA under an Emergency Use Authorization (EUA). This EUA will remain  in effect (meaning this test can be used) for the duration of the COVID-19 declaration under Section 564(b)(1) of the Act, 21 U.S.C.section 360bbb-3(b)(1), unless the authorization is terminated  or revoked sooner.       Influenza A by PCR NEGATIVE NEGATIVE Final   Influenza B by PCR NEGATIVE NEGATIVE Final    Comment: (NOTE) The Xpert Xpress SARS-CoV-2/FLU/RSV plus assay is intended as an aid in the diagnosis of influenza from Nasopharyngeal swab specimens and should not be used as a sole basis for treatment. Nasal washings  and aspirates are unacceptable for Xpert Xpress SARS-CoV-2/FLU/RSV testing.  Fact Sheet for Patients: EntrepreneurPulse.com.au  Fact Sheet for Healthcare Providers: IncredibleEmployment.be  This test is not yet approved or cleared by the [place] FDA and has been authorized for detection and/or diagnosis of SARS-CoV-2 by FDA under an Emergency Use Authorization (EUA). This EUA will remain in effect (meaning this test can be used) for the duration of the COVID-19 declaration under Section 564(b)(1) of the Act, 21 U.S.C. section 360bbb-3(b)(1), unless the authorization is terminated or revoked.  Performed at [facility], [place] [address], [place], [place] [address]   MRSA PCR Screening     Status: None  Collection Time: 03/24/20  2:46 PM   Specimen: Nasal Mucosa; Nasopharyngeal  Result Value Ref Range Status   MRSA by PCR NEGATIVE NEGATIVE Final    Comment:        The GeneXpert MRSA Assay (FDA approved for NASAL specimens only), is one component of a comprehensive MRSA colonization surveillance program. It is not intended to diagnose MRSA infection nor to guide or monitor treatment for MRSA infections. Performed at [facility], [place] [address] [place], [place] [address]          Radiology Studies: [facility] Chest [place] 1 View  Result Date: 03/24/2020 CLINICAL DATA:  70 year old female with weakness, hyperglycemia. EXAM: PORTABLE CHEST 1 VIEW COMPARISON:  Chest radiograph 07/05/2013. FINDINGS: Portable AP semi upright view at 0822 hours. Low lung volumes. Heart size and mediastinal contours remain within normal limits. Visualized tracheal air column is within normal limits. Allowing for portable technique the lungs are clear. No pneumothorax. Negative visible bowel gas, osseous structures. IMPRESSION: Low lung volumes. No acute cardiopulmonary abnormality. Electronically Signed   By: Genevie Ann M.D.   On:  03/24/2020 08:36   ECHOCARDIOGRAM COMPLETE  Result Date: 03/24/2020    ECHOCARDIOGRAM REPORT   Patient Name:   DANICIA TERHAAR Date of Exam: 03/24/2020 Medical Rec #:  [id number]        Height:       63.0 in Accession #:    [id number]       Weight:       173.5 lb Date of Birth:  August 23, 1950        BSA:          1.820 m Patient Age:    57 years         BP:           95/52 mmHg Patient Gender: F                HR:           91 bpm. Exam Location:  Inpatient Procedure: 2D Echo, 3D Echo, Cardiac Doppler and Color Doppler Indications:    R06.02 SOB. Diabetic ketoacidosis.  History:        Patient has no prior history of Echocardiogram examinations.                 Signs/Symptoms:Dyspnea; Risk Factors:Diabetes, Hypertension and                 Dyslipidemia. Diabetic ketoacidosis.  Sonographer:    Roseanna Rainbow RDCS Referring Phys: [phone] [facility]  Sonographer Comments: Technically difficult study due to poor echo windows. Image acquisition challenging due to patient body habitus. Supine. IMPRESSIONS  1. There is a dynamic LV gradient that increases with valsalva. The peak gradient measured was 22 mmHg.     Marland Kitchen Left ventricular ejection fraction, by estimation, is 65 to 70%. The left ventricle has normal function. The left ventricle has no regional wall motion abnormalities. Left ventricular diastolic parameters were normal.  2. Right ventricular systolic function is normal. The right ventricular size is normal. There is mildly elevated pulmonary artery systolic pressure.  3. The mitral valve is normal in structure. No evidence of mitral valve regurgitation. No evidence of mitral stenosis.  4. The aortic valve is grossly normal. Aortic valve regurgitation is not visualized. FINDINGS  Left Ventricle: There is a dynamic LV gradient that increases with valsalva. The peak gradient measured was 22 mmHg. Left ventricular ejection fraction, by estimation, is 65 to 70%. The  left ventricle has normal function. The left ventricle  has no regional wall motion abnormalities. The left ventricular internal cavity size was normal in size. There is no left ventricular hypertrophy. Left ventricular diastolic parameters were normal. Right Ventricle: The right ventricular size is normal. No increase in right ventricular wall thickness. Right ventricular systolic function is normal. There is mildly elevated pulmonary artery systolic pressure. The tricuspid regurgitant velocity is 2.90  m/s, and with an assumed right atrial pressure of 8 mmHg, the estimated right ventricular systolic pressure is 60.7 mmHg. Left Atrium: Left atrial size was normal in size. Right Atrium: Right atrial size was normal in size. Pericardium: There is no evidence of pericardial effusion. Mitral Valve: The mitral valve is normal in structure. No evidence of mitral valve regurgitation. No evidence of mitral valve stenosis. Tricuspid Valve: The tricuspid valve is normal in structure. Tricuspid valve regurgitation is mild. Aortic Valve: The aortic valve is grossly normal. Aortic valve regurgitation is not visualized. Pulmonic Valve: The pulmonic valve was grossly normal. Pulmonic valve regurgitation is not visualized. Aorta: The aortic root and ascending aorta are structurally normal, with no evidence of dilitation. IAS/Shunts: The atrial septum is grossly normal.  LEFT VENTRICLE PLAX 2D LVIDd:         3.30 cm     Diastology LVIDs:         2.10 cm     LV e' medial:    8.81 cm/s LV PW:         1.10 cm     LV E/e' medial:  12.9 LV IVS:        1.10 cm     LV e' lateral:   9.57 cm/s LVOT diam:     1.90 cm     LV E/e' lateral: 11.9 LV SV:         91 LV SV Index:   50 LVOT Area:     2.84 cm                             3D Volume EF: LV Volumes (MOD)           3D EF:        69 % LV vol d, MOD A2C: 38.7 ml LV EDV:       86 ml LV vol d, MOD A4C: 48.3 ml LV ESV:       27 ml LV vol s, MOD A2C: 8.7 ml  LV SV:        59 ml LV vol s, MOD A4C: 15.1 ml LV SV MOD A2C:     30.0 ml LV SV MOD A4C:      48.3 ml LV SV MOD BP:      32.5 ml RIGHT VENTRICLE             IVC RV S prime:     13.80 cm/s  IVC diam: 2.70 cm TAPSE (M-mode): 1.5 cm LEFT ATRIUM             Index       RIGHT ATRIUM          Index LA diam:        2.50 cm 1.37 cm/m  RA Area:     9.84 cm LA Vol (A2C):   27.2 ml 14.94 ml/m RA Volume:   20.80 ml 11.43 ml/m LA Vol (A4C):   35.3 ml 19.39 ml/m LA Biplane Vol: 31.3 ml 17.19 ml/m  AORTIC VALVE LVOT Vmax:   161.00 cm/s LVOT Vmean:  104.000 cm/s LVOT VTI:    0.321 m  AORTA Ao Root diam: 3.10 cm Ao Asc diam:  3.00 cm MITRAL VALVE                TRICUSPID VALVE MV Area (PHT): 2.76 cm     TR Peak grad:   33.6 mmHg MV Decel Time: 275 msec     TR Vmax:        290.00 cm/s MV E velocity: 114.00 cm/s MV A velocity: 118.00 cm/s  SHUNTS MV E/A ratio:  0.97         Systemic VTI:  0.32 m                             Systemic Diam: 1.90 cm Mertie Moores MD Electronically signed by Mertie Moores MD Signature Date/Time: 03/24/2020/4:22:48 PM    Final    VAS [name] LOWER EXTREMITY VENOUS (DVT)  Result Date: 03/24/2020  Lower Venous DVT Study Indications: SOB - however O2 is 100% on room air.  Comparison Study: No previous exams Performing Technologist: Rogelia Rohrer  Examination Guidelines: A complete evaluation includes B-mode imaging, spectral Doppler, color Doppler, and power Doppler as needed of all accessible portions of each vessel. Bilateral testing is considered an integral part of a complete examination. Limited examinations for reoccurring indications may be performed as noted. The reflux portion of the exam is performed with the patient in reverse Trendelenburg.  +---------+---------------+---------+-----------+----------+--------------+ RIGHT    CompressibilityPhasicitySpontaneityPropertiesThrombus Aging +---------+---------------+---------+-----------+----------+--------------+ CFV      Full           Yes      Yes                                  +---------+---------------+---------+-----------+----------+--------------+ SFJ      Full                                                        +---------+---------------+---------+-----------+----------+--------------+ FV Prox  Full           Yes      Yes                                 +---------+---------------+---------+-----------+----------+--------------+ FV Mid   Full           Yes      Yes                                 +---------+---------------+---------+-----------+----------+--------------+ FV DistalFull           Yes      Yes                                 +---------+---------------+---------+-----------+----------+--------------+ PFV      Full                                                        +---------+---------------+---------+-----------+----------+--------------+  POP      Full           Yes      Yes                                 +---------+---------------+---------+-----------+----------+--------------+ PTV      Full                                                        +---------+---------------+---------+-----------+----------+--------------+ PERO     Full                                                        +---------+---------------+---------+-----------+----------+--------------+   +---------+---------------+---------+-----------+----------+--------------+ LEFT     CompressibilityPhasicitySpontaneityPropertiesThrombus Aging +---------+---------------+---------+-----------+----------+--------------+ CFV      Full           Yes      Yes                                 +---------+---------------+---------+-----------+----------+--------------+ SFJ      Full                                                        +---------+---------------+---------+-----------+----------+--------------+ FV Prox  Full           Yes      Yes                                  +---------+---------------+---------+-----------+----------+--------------+ FV Mid   Full           Yes      Yes                                 +---------+---------------+---------+-----------+----------+--------------+ FV DistalFull           Yes      Yes                                 +---------+---------------+---------+-----------+----------+--------------+ PFV      Full                                                        +---------+---------------+---------+-----------+----------+--------------+ POP      Full           Yes      Yes                                 +---------+---------------+---------+-----------+----------+--------------+ PTV  Full                                                        +---------+---------------+---------+-----------+----------+--------------+ PERO     Full                                                        +---------+---------------+---------+-----------+----------+--------------+     Summary: BILATERAL: - No evidence of deep vein thrombosis seen in the lower extremities, bilaterally. - No evidence of superficial venous thrombosis in the lower extremities, bilaterally. -No evidence of popliteal cyst, bilaterally.   *See table(s) above for measurements and observations. Electronically signed by Deitra Mayo MD on 03/24/2020 at 4:58:56 PM.    Final         Scheduled Meds: . Chlorhexidine Gluconate Cloth  6 each Topical Daily  . enoxaparin (LOVENOX) injection  30 mg Subcutaneous Q24H  . insulin aspart  0-5 Units Subcutaneous QHS  . insulin aspart  0-9 Units Subcutaneous TID WC  . insulin NPH Human  25 Units Subcutaneous QHS  . insulin NPH Human  35 Units Subcutaneous QAC breakfast  . mouth rinse  15 mL Mouth Rinse BID   Continuous Infusions: . lactated ringers 125 mL/hr at 03/24/20 1941     LOS: 1 day        Hosie Poisson, MD [facility]   To contact the attending provider between  7A-7P or the covering provider during after hours 7P-7A, please log into the web site [email or web address] and access using universal [place] password for that web site. If you do not have the password, please call the hospital operator.  03/25/2020, 1:55 PM

## 2020-03-26 LAB — GLUCOSE, CAPILLARY
Glucose-Capillary: 36 mg/dL — CL (ref 70–99)
Glucose-Capillary: 76 mg/dL (ref 70–99)
Glucose-Capillary: 152 mg/dL — ABNORMAL HIGH (ref 70–99)
Glucose-Capillary: 197 mg/dL — ABNORMAL HIGH (ref 70–99)
Glucose-Capillary: 263 mg/dL — ABNORMAL HIGH (ref 70–99)
Glucose-Capillary: 191 mg/dL — ABNORMAL HIGH (ref 70–99)

## 2020-03-26 LAB — BASIC METABOLIC PANEL
Anion gap: 9 (ref 5–15)
BUN: 36 mg/dL — ABNORMAL HIGH (ref 8–23)
CO2: 26 mmol/L (ref 22–32)
Calcium: 8 mg/dL — ABNORMAL LOW (ref 8.9–10.3)
Chloride: 100 mmol/L (ref 98–111)
Creatinine, Ser: 1.08 mg/dL — ABNORMAL HIGH (ref 0.44–1.00)
GFR, Estimated: 56 mL/min — ABNORMAL LOW (ref >60)
Glucose, Bld: 92 mg/dL (ref 70–99)
Potassium: 3.5 mmol/L (ref 3.5–5.1)
Sodium: 135 mmol/L (ref 135–145)

## 2020-03-26 LAB — PHOSPHORUS: Phosphorus: 1.6 mg/dL — ABNORMAL LOW (ref 2.5–4.6)

## 2020-03-26 MED ORDER — INSULIN NPH (HUMAN) (ISOPHANE) 100 UNIT/ML ~~LOC~~ SUSP
15.0000 [IU] | Freq: Every day | SUBCUTANEOUS | Status: DC
  Administered 2020-03-26: 15 [IU] via SUBCUTANEOUS

## 2020-03-26 MED ORDER — ENOXAPARIN SODIUM 40 MG/0.4ML ~~LOC~~ SOLN
40.0000 mg | SUBCUTANEOUS | Status: DC
  Administered 2020-03-27 – 2020-03-28 (×2): 40 mg via SUBCUTANEOUS
  Filled 2020-03-26 (×2): qty 0.4

## 2020-03-26 MED ORDER — POTASSIUM & SODIUM PHOSPHATES 280-160-250 MG PO PACK
1.0000 | PACK | Freq: Three times a day (TID) | ORAL | Status: AC
  Administered 2020-03-26 – 2020-03-27 (×6): 1 via ORAL
  Filled 2020-03-26 (×6): qty 1

## 2020-03-26 NOTE — Progress Notes (Addendum)
Inpatient Diabetes Program Recommendations  AACE/ADA: New Consensus Statement on Inpatient Glycemic Control (2015)  Target Ranges:  Prepandial:   less than 140 mg/dL      Peak postprandial:   less than 180 mg/dL (1-2 hours)      Critically ill patients:  140 - 180 mg/dL   Lab Results  Component Value Date   GLUCAP 76 03/26/2020   HGBA1C 13.6 (A) 03/12/2020    Review of Glycemic Control Results for Mandy Mullen, Mandy Mullen (MRN [id number]) as of 03/26/2020 08:05  Ref. Range 03/25/2020 08:19 03/25/2020 12:09 03/25/2020 16:44 03/25/2020 17:12 03/25/2020 20:57 03/26/2020 07:26 03/26/2020 07:52  Glucose-Capillary Latest Ref Range: 70 - 99 mg/dL 106 (H) 107 (H) 67 (L) 91 138 (H) 36 (LL) 76   Diabetes history:DM2 Outpatient Diabetes medications:NPH 35 units am + 25 units pm Current orders for Inpatient glycemic control: NPH 35 units am + 25 units pm + Novolog 0-9 units tid + hs 0-5 units  Inpatient Diabetes Program Recommendations:   Noted hypoglycemia on current NPH dose. Consider: -Decrease NPH to 15 units qd and adjust as needed Secure chat sent to Dr. Karleen Hampshire.  Thank you, Nani Gasser. Leisl Spurrier, RN, MSN, CDE  Diabetes Coordinator Inpatient Glycemic Control Team Team Pager [phone] (8am-5pm) 03/26/2020 8:07 AM

## 2020-03-26 NOTE — Progress Notes (Signed)
PROGRESS NOTE    Mandy Mullen  ZHG:[id number] DOB: 1950-10-29 DOA: 03/24/2020 PCP: Lucious Groves, DO    Chief Complaint  Patient presents with  . Hyperglycemia    Brief Narrative: 70 y.o. female with a known history of hypertension, hyperlipidemia, insulin-dependent type 2 diabetes, CKD stage IIIa is being admitted for DKA. Assessment & Plan:   Active Problems:   Diabetic keto-acidosis ([place])   Severe DKA Resolved She was initially started on GTT and transition to NovoLog 70/30. CBG (last 3)  Recent Labs    03/26/20 0752 03/26/20 0959 03/26/20 1331  GLUCAP 76 152* 197*   Continue with sliding scale insulin in addition to the NovoLog 70/30. Bicarb is 26, anion gap is closed Beta hydroxybutyric acid is 0.49, . Earlier this am, her cbg dropped to 80, and went to 71 after eating.  Decreased the dose of Novolog to 15 units BID,     Hypomagnesemia REPLACED  Hypophosphatemia:  Replaced.    Hypertension Blood pressure parameters appear to be optimal    Acute on stage IIIa CKD probably secondary to DKA Improving with IV fluids. Creatinine is at 1.08, was around 2.2 on admission.  Recommend to continue with IV fluids for another 24 hours.  Recheck renal parameters in am.           DVT prophylaxis: lovenox.  Code Status: Full code Family Communication:family at bedside.  Disposition:   Status is: Inpatient  Remains inpatient appropriate because:IV treatments appropriate due to intensity of illness or inability to take PO   Dispo: The patient is from: Home              Anticipated d/c is to: Home              Patient currently is not medically stable to d/c.   Difficult to place patient No       Consultants:   none   Procedures: none.    Antimicrobials: none.    Subjective: Has two episodes of hypoglycemia in the last 24 hours.  She denies any new complaints.    Objective: Vitals:   03/25/20 1900 03/25/20 2055 03/26/20 0139  03/26/20 0509  BP: (!) 142/71 (!) 141/68 126/67 117/68  Pulse: 88 87 87 79  Resp: 14 18 18 17   Temp:  98.8 F (37.1 C) 98.3 F (36.8 C) 98.3 F (36.8 C)  TempSrc:  Oral Oral Oral  SpO2: 98% 97% 97% 94%  Weight:      Height:        Intake/Output Summary (Last 24 hours) at 03/26/2020 1421 Last data filed at 03/26/2020 1222 Gross per 24 hour  Intake 1909.19 ml  Output -  Net 1909.19 ml   Filed Weights   03/24/20 0800 03/24/20 1426  Weight: 77.1 kg 78.7 kg    Examination:  General exam: Alert, not in any kind of distress Respiratory system: Clear to auscultation bilaterally, no wheezing or rhonchi, on room air Cardiovascular system: S1-S2 heard, regular rate rhythm, no JVD no pedal edema Gastrointestinal system: Abdomen is soft nontender bowel sounds normal Central nervous system: Alert and oriented, grossly nonfocal Extremities: No pedal edema, cyanosis Skin: No rashes seen Psychiatry: Mood is appropriate    Data Reviewed: I have personally reviewed following labs and imaging studies  CBC: Recent Labs  Lab 03/24/20 0835 03/24/20 0845  WBC 24.4*  --   NEUTROABS 22.6*  --   HGB 10.7* 12.2  HCT 35.9* 36.0  MCV 99.2  --  PLT 325  --     Basic Metabolic Panel: Recent Labs  Lab 03/24/20 1832 03/24/20 2031 03/25/20 0025 03/25/20 0430 03/26/20 0324  NA 134* 136 136 136 135  K 3.9 3.6 3.7 3.6 3.5  CL 96* 99 99 101 100  CO2 24 25 27 26 26   GLUCOSE 367* 240* 145* 143* 92  BUN 61* 69* 72* 64* 36*  CREATININE 2.15* 1.88* 1.72* 1.63* 1.08*  CALCIUM 8.2* 8.1* 8.0* 8.1* 8.0*  MG  --   --   --  2.4  --   PHOS  --   --   --  1.9* 1.6*    GFR: Estimated Creatinine Clearance: 49.5 mL/min (A) (by C-G formula based on SCr of 1.08 mg/dL (H)).  Liver Function Tests: Recent Labs  Lab 03/25/20 0430  AST 24  ALT 34  ALKPHOS 77  BILITOT 0.5  PROT 5.0*  ALBUMIN 2.7*    CBG: Recent Labs  Lab 03/25/20 2057 03/26/20 0726 03/26/20 0752 03/26/20 0959  03/26/20 1331  GLUCAP 138* 36* 76 152* 197*     Recent Results (from the past 240 hour(s))  Resp Panel by RT-PCR (Flu A&B, Covid) Nasopharyngeal Swab     Status: None   Collection Time: 03/24/20  8:35 AM   Specimen: Nasopharyngeal Swab; Nasopharyngeal(NP) swabs in vial transport medium  Result Value Ref Range Status   SARS Coronavirus 2 by RT PCR NEGATIVE NEGATIVE Final    Comment: (NOTE) SARS-CoV-2 target nucleic acids are NOT DETECTED.  The SARS-CoV-2 RNA is generally detectable in upper respiratory specimens during the acute phase of infection. The lowest concentration of SARS-CoV-2 viral copies this assay can detect is 138 copies/mL. A negative result does not preclude SARS-Cov-2 infection and should not be used as the sole basis for treatment or other patient management decisions. A negative result may occur with  improper specimen collection/handling, submission of specimen other than nasopharyngeal swab, presence of viral mutation(s) within the areas targeted by this assay, and inadequate number of viral copies(<138 copies/mL). A negative result must be combined with clinical observations, patient history, and epidemiological information. The expected result is Negative.  Fact Sheet for Patients:  EntrepreneurPulse.com.au  Fact Sheet for Healthcare Providers:  IncredibleEmployment.be  This test is no t yet approved or cleared by the [place] FDA and  has been authorized for detection and/or diagnosis of SARS-CoV-2 by FDA under an Emergency Use Authorization (EUA). This EUA will remain  in effect (meaning this test can be used) for the duration of the COVID-19 declaration under Section 564(b)(1) of the Act, 21 U.S.C.section 360bbb-3(b)(1), unless the authorization is terminated  or revoked sooner.       Influenza A by PCR NEGATIVE NEGATIVE Final   Influenza B by PCR NEGATIVE NEGATIVE Final    Comment: (NOTE) The Xpert Xpress  SARS-CoV-2/FLU/RSV plus assay is intended as an aid in the diagnosis of influenza from Nasopharyngeal swab specimens and should not be used as a sole basis for treatment. Nasal washings and aspirates are unacceptable for Xpert Xpress SARS-CoV-2/FLU/RSV testing.  Fact Sheet for Patients: EntrepreneurPulse.com.au  Fact Sheet for Healthcare Providers: IncredibleEmployment.be  This test is not yet approved or cleared by the [place] FDA and has been authorized for detection and/or diagnosis of SARS-CoV-2 by FDA under an Emergency Use Authorization (EUA). This EUA will remain in effect (meaning this test can be used) for the duration of the COVID-19 declaration under Section 564(b)(1) of the Act, 21 U.S.C. section 360bbb-3(b)(1), unless the  authorization is terminated or revoked.  Performed at [facility], [place] [address] [place], [place] [address]   MRSA PCR Screening     Status: None   Collection Time: 03/24/20  2:46 PM   Specimen: Nasal Mucosa; Nasopharyngeal  Result Value Ref Range Status   MRSA by PCR NEGATIVE NEGATIVE Final    Comment:        The GeneXpert MRSA Assay (FDA approved for NASAL specimens only), is one component of a comprehensive MRSA colonization surveillance program. It is not intended to diagnose MRSA infection nor to guide or monitor treatment for MRSA infections. Performed at [facility], [place] [address] [place], [place] [address]          Radiology Studies: ECHOCARDIOGRAM COMPLETE  Result Date: 03/24/2020    ECHOCARDIOGRAM REPORT   Patient Name:   SAHIAN KERNEY Date of Exam: 03/24/2020 Medical Rec #:  [id number]        Height:       63.0 in Accession #:    [id number]       Weight:       173.5 lb Date of Birth:  [birth year]        BSA:          1.820 m Patient Age:    88 years         BP:           95/52 mmHg Patient Gender: F                HR:           91 bpm. Exam  Location:  Inpatient Procedure: 2D Echo, 3D Echo, Cardiac Doppler and Color Doppler Indications:    R06.02 SOB. Diabetic ketoacidosis.  History:        Patient has no prior history of Echocardiogram examinations.                 Signs/Symptoms:Dyspnea; Risk Factors:Diabetes, Hypertension and                 Dyslipidemia. Diabetic ketoacidosis.  Sonographer:    Roseanna Rainbow RDCS Referring Phys: [phone] [facility]  Sonographer Comments: Technically difficult study due to poor echo windows. Image acquisition challenging due to patient body habitus. Supine. IMPRESSIONS  1. There is a dynamic LV gradient that increases with valsalva. The peak gradient measured was 22 mmHg.     Marland Kitchen Left ventricular ejection fraction, by estimation, is 65 to 70%. The left ventricle has normal function. The left ventricle has no regional wall motion abnormalities. Left ventricular diastolic parameters were normal.  2. Right ventricular systolic function is normal. The right ventricular size is normal. There is mildly elevated pulmonary artery systolic pressure.  3. The mitral valve is normal in structure. No evidence of mitral valve regurgitation. No evidence of mitral stenosis.  4. The aortic valve is grossly normal. Aortic valve regurgitation is not visualized. FINDINGS  Left Ventricle: There is a dynamic LV gradient that increases with valsalva. The peak gradient measured was 22 mmHg. Left ventricular ejection fraction, by estimation, is 65 to 70%. The left ventricle has normal function. The left ventricle has no regional wall motion abnormalities. The left ventricular internal cavity size was normal in size. There is no left ventricular hypertrophy. Left ventricular diastolic parameters were normal. Right Ventricle: The right ventricular size is normal. No increase in right ventricular wall thickness. Right ventricular systolic function is normal. There is mildly elevated pulmonary artery systolic  pressure. The tricuspid regurgitant  velocity is 2.90  m/s, and with an assumed right atrial pressure of 8 mmHg, the estimated right ventricular systolic pressure is 20.3 mmHg. Left Atrium: Left atrial size was normal in size. Right Atrium: Right atrial size was normal in size. Pericardium: There is no evidence of pericardial effusion. Mitral Valve: The mitral valve is normal in structure. No evidence of mitral valve regurgitation. No evidence of mitral valve stenosis. Tricuspid Valve: The tricuspid valve is normal in structure. Tricuspid valve regurgitation is mild. Aortic Valve: The aortic valve is grossly normal. Aortic valve regurgitation is not visualized. Pulmonic Valve: The pulmonic valve was grossly normal. Pulmonic valve regurgitation is not visualized. Aorta: The aortic root and ascending aorta are structurally normal, with no evidence of dilitation. IAS/Shunts: The atrial septum is grossly normal.  LEFT VENTRICLE PLAX 2D LVIDd:         3.30 cm     Diastology LVIDs:         2.10 cm     LV e' medial:    8.81 cm/s LV PW:         1.10 cm     LV E/e' medial:  12.9 LV IVS:        1.10 cm     LV e' lateral:   9.57 cm/s LVOT diam:     1.90 cm     LV E/e' lateral: 11.9 LV SV:         91 LV SV Index:   50 LVOT Area:     2.84 cm                             3D Volume EF: LV Volumes (MOD)           3D EF:        69 % LV vol d, MOD A2C: 38.7 ml LV EDV:       86 ml LV vol d, MOD A4C: 48.3 ml LV ESV:       27 ml LV vol s, MOD A2C: 8.7 ml  LV SV:        59 ml LV vol s, MOD A4C: 15.1 ml LV SV MOD A2C:     30.0 ml LV SV MOD A4C:     48.3 ml LV SV MOD BP:      32.5 ml RIGHT VENTRICLE             IVC RV S prime:     13.80 cm/s  IVC diam: 2.70 cm TAPSE (M-mode): 1.5 cm LEFT ATRIUM             Index       RIGHT ATRIUM          Index LA diam:        2.50 cm 1.37 cm/m  RA Area:     9.84 cm LA Vol (A2C):   27.2 ml 14.94 ml/m RA Volume:   20.80 ml 11.43 ml/m LA Vol (A4C):   35.3 ml 19.39 ml/m LA Biplane Vol: 31.3 ml 17.19 ml/m  AORTIC VALVE LVOT Vmax:   161.00  cm/s LVOT Vmean:  104.000 cm/s LVOT VTI:    0.321 m  AORTA Ao Root diam: 3.10 cm Ao Asc diam:  3.00 cm MITRAL VALVE                TRICUSPID VALVE MV Area (PHT): 2.76 cm     TR Peak  grad:   33.6 mmHg MV Decel Time: 275 msec     TR Vmax:        290.00 cm/s MV E velocity: 114.00 cm/s MV A velocity: 118.00 cm/s  SHUNTS MV E/A ratio:  0.97         Systemic VTI:  0.32 m                             Systemic Diam: 1.90 cm Mertie Moores MD Electronically signed by Mertie Moores MD Signature Date/Time: 03/24/2020/4:22:48 PM    Final    VAS [name] LOWER EXTREMITY VENOUS (DVT)  Result Date: 03/24/2020  Lower Venous DVT Study Indications: SOB - however O2 is 100% on room air.  Comparison Study: No previous exams Performing Technologist: Rogelia Rohrer  Examination Guidelines: A complete evaluation includes B-mode imaging, spectral Doppler, color Doppler, and power Doppler as needed of all accessible portions of each vessel. Bilateral testing is considered an integral part of a complete examination. Limited examinations for reoccurring indications may be performed as noted. The reflux portion of the exam is performed with the patient in reverse Trendelenburg.  +---------+---------------+---------+-----------+----------+--------------+ RIGHT    CompressibilityPhasicitySpontaneityPropertiesThrombus Aging +---------+---------------+---------+-----------+----------+--------------+ CFV      Full           Yes      Yes                                 +---------+---------------+---------+-----------+----------+--------------+ SFJ      Full                                                        +---------+---------------+---------+-----------+----------+--------------+ FV Prox  Full           Yes      Yes                                 +---------+---------------+---------+-----------+----------+--------------+ FV Mid   Full           Yes      Yes                                  +---------+---------------+---------+-----------+----------+--------------+ FV DistalFull           Yes      Yes                                 +---------+---------------+---------+-----------+----------+--------------+ PFV      Full                                                        +---------+---------------+---------+-----------+----------+--------------+ POP      Full           Yes      Yes                                 +---------+---------------+---------+-----------+----------+--------------+  PTV      Full                                                        +---------+---------------+---------+-----------+----------+--------------+ PERO     Full                                                        +---------+---------------+---------+-----------+----------+--------------+   +---------+---------------+---------+-----------+----------+--------------+ LEFT     CompressibilityPhasicitySpontaneityPropertiesThrombus Aging +---------+---------------+---------+-----------+----------+--------------+ CFV      Full           Yes      Yes                                 +---------+---------------+---------+-----------+----------+--------------+ SFJ      Full                                                        +---------+---------------+---------+-----------+----------+--------------+ FV Prox  Full           Yes      Yes                                 +---------+---------------+---------+-----------+----------+--------------+ FV Mid   Full           Yes      Yes                                 +---------+---------------+---------+-----------+----------+--------------+ FV DistalFull           Yes      Yes                                 +---------+---------------+---------+-----------+----------+--------------+ PFV      Full                                                         +---------+---------------+---------+-----------+----------+--------------+ POP      Full           Yes      Yes                                 +---------+---------------+---------+-----------+----------+--------------+ PTV      Full                                                        +---------+---------------+---------+-----------+----------+--------------+  PERO     Full                                                        +---------+---------------+---------+-----------+----------+--------------+     Summary: BILATERAL: - No evidence of deep vein thrombosis seen in the lower extremities, bilaterally. - No evidence of superficial venous thrombosis in the lower extremities, bilaterally. -No evidence of popliteal cyst, bilaterally.   *See table(s) above for measurements and observations. Electronically signed by Deitra Mayo MD on 03/24/2020 at 4:58:56 PM.    Final         Scheduled Meds: . Chlorhexidine Gluconate Cloth  6 each Topical Daily  . enoxaparin (LOVENOX) injection  30 mg Subcutaneous Q24H  . insulin aspart  0-5 Units Subcutaneous QHS  . insulin aspart  0-9 Units Subcutaneous TID WC  . insulin NPH Human  15 Units Subcutaneous QHS  . mouth rinse  15 mL Mouth Rinse BID  . potassium & sodium phosphates  1 packet Oral TID WC & HS  . pravastatin  40 mg Oral Daily   Continuous Infusions: . lactated ringers 75 mL/hr at 03/26/20 0234     LOS: 2 days        Hosie Poisson, MD [facility]   To contact the attending provider between 7A-7P or the covering provider during after hours 7P-7A, please log into the web site [email or web address] and access using universal [place] password for that web site. If you do not have the password, please call the hospital operator.  03/26/2020, 2:21 PM

## 2020-03-27 LAB — GLUCOSE, CAPILLARY
Glucose-Capillary: 36 mg/dL — CL (ref 70–99)
Glucose-Capillary: 95 mg/dL (ref 70–99)
Glucose-Capillary: 317 mg/dL — ABNORMAL HIGH (ref 70–99)
Glucose-Capillary: 131 mg/dL — ABNORMAL HIGH (ref 70–99)
Glucose-Capillary: 249 mg/dL — ABNORMAL HIGH (ref 70–99)

## 2020-03-27 LAB — BASIC METABOLIC PANEL
Anion gap: 10 (ref 5–15)
BUN: 17 mg/dL (ref 8–23)
CO2: 27 mmol/L (ref 22–32)
Calcium: 8.4 mg/dL — ABNORMAL LOW (ref 8.9–10.3)
Chloride: 102 mmol/L (ref 98–111)
Creatinine, Ser: 0.76 mg/dL (ref 0.44–1.00)
GFR, Estimated: >60 mL/min (ref >60)
Glucose, Bld: 59 mg/dL — ABNORMAL LOW (ref 70–99)
Potassium: 3.5 mmol/L (ref 3.5–5.1)
Sodium: 139 mmol/L (ref 135–145)

## 2020-03-27 LAB — PHOSPHORUS: Phosphorus: 2.5 mg/dL (ref 2.5–4.6)

## 2020-03-27 LAB — CBC
HCT: 32.9 % — ABNORMAL LOW (ref 36.0–46.0)
Hemoglobin: 10.7 g/dL — ABNORMAL LOW (ref 12.0–15.0)
MCH: 29.6 pg (ref 26.0–34.0)
MCHC: 32.5 g/dL (ref 30.0–36.0)
MCV: 90.9 fL (ref 80.0–100.0)
Platelets: 220 10*3/uL (ref 150–400)
RBC: 3.62 MIL/uL — ABNORMAL LOW (ref 3.87–5.11)
RDW: 14.5 % (ref 11.5–15.5)
WBC: 8.4 10*3/uL (ref 4.0–10.5)
nRBC: 0 % (ref 0.0–0.2)

## 2020-03-27 MED ORDER — INSULIN ASPART 100 UNIT/ML ~~LOC~~ SOLN
2.0000 [IU] | Freq: Three times a day (TID) | SUBCUTANEOUS | Status: DC
  Administered 2020-03-27 – 2020-03-28 (×3): 2 [IU] via SUBCUTANEOUS

## 2020-03-27 MED ORDER — INSULIN NPH (HUMAN) (ISOPHANE) 100 UNIT/ML ~~LOC~~ SUSP
8.0000 [IU] | Freq: Two times a day (BID) | SUBCUTANEOUS | Status: DC
  Administered 2020-03-27 – 2020-03-28 (×2): 8 [IU] via SUBCUTANEOUS

## 2020-03-27 MED ORDER — SENNOSIDES-DOCUSATE SODIUM 8.6-50 MG PO TABS
2.0000 | ORAL_TABLET | Freq: Two times a day (BID) | ORAL | Status: DC
  Administered 2020-03-27 (×2): 2 via ORAL
  Filled 2020-03-27 (×3): qty 2

## 2020-03-27 MED ORDER — POLYETHYLENE GLYCOL 3350 17 G PO PACK
17.0000 g | PACK | Freq: Every day | ORAL | Status: DC
  Administered 2020-03-27: 17 g via ORAL
  Filled 2020-03-27 (×2): qty 1

## 2020-03-27 NOTE — Progress Notes (Signed)
PROGRESS NOTE    Mandy Mullen  AYT:[id number] DOB: November 17, 1950 DOA: 03/24/2020 PCP: Lucious Groves, DO    Chief Complaint  Patient presents with  . Hyperglycemia    Brief Narrative: 70 y.o. female with a known history of hypertension, hyperlipidemia, insulin-dependent type 2 diabetes, CKD stage IIIa is being admitted for DKA. DKA resolved.   Assessment & Plan:   Active Problems:   Diabetic keto-acidosis ([place])   Severe DKA Resolved She was initially started on GTT and transition to NovoLog 70/30. CBG (last 3)  Recent Labs    03/27/20 0722 03/27/20 1136 03/27/20 1639  GLUCAP 95 317* 131*   Continue with sliding scale insulin in addition to the NovoLog 70/30. Bicarb is 26, anion gap is closed Beta hydroxybutyric acid is 0.49, . Earlier this am, her cbg dropped to 15, and went to 71 after eating.  Decrease novolog 70/30 mm hg.  8 units BID.  Please provide midnight snack.     Hypomagnesemia Replaced.   Hypophosphatemia:  Replaced.    Hypertension Blood pressure parameters appear to be optimal    Acute on stage IIIa CKD probably secondary to DKA Improving with IV fluids. Creatinine improved to 0.76, was around 2.2 on admission.  D/c IV fluids.  Recheck renal parameters show improvement.     Anemia of chronic disease:  Hemoglobin is stable around 10.    Leukocytosis:  Resolved.    DVT prophylaxis: lovenox.  Code Status: Full code Family Communication:none  at bedside.  Disposition:   Status is: Inpatient  Remains inpatient appropriate because:IV treatments appropriate due to intensity of illness or inability to take PO   Dispo: The patient is from: Home              Anticipated d/c is to: Home              Patient currently is not medically stable to d/c.   Difficult to place patient No       Consultants:   none   Procedures: none.    Antimicrobials: none.    Subjective: Another hypoglycemia this am. Pt reports she takes  midnight snack at work and that she works in the third shift.   Objective: Vitals:   03/26/20 1435 03/26/20 2117 03/27/20 0549 03/27/20 1258  BP: 140/81 (!) 144/76 115/60 136/82  Pulse: 92 91 75 88  Resp: 17 17 17 17   Temp: 98.8 F (37.1 C) 99.1 F (37.3 C) 98.7 F (37.1 C) 99 F (37.2 C)  TempSrc: Oral Oral Oral Oral  SpO2: 99% 96% 94% 99%  Weight:      Height:        Intake/Output Summary (Last 24 hours) at 03/27/2020 1710 Last data filed at 03/27/2020 0930 Gross per 24 hour  Intake 2073.68 ml  Output --  Net 2073.68 ml   Filed Weights   03/24/20 0800 03/24/20 1426  Weight: 77.1 kg 78.7 kg    Examination:  General exam: alert and comfortable.  Respiratory system: clear to auscultation , no wheezing heard . Cardiovascular system: S1S2 RRR, no jvd, No pedal edema.  Gastrointestinal system: Abdomen is soft NT ND BS+ Central nervous system: Alert and oriented, nonfocal.  Extremities: no pedal edema.  Skin: No rashes seen.  Psychiatry: Mood is appropriate    Data Reviewed: I have personally reviewed following labs and imaging studies  CBC: Recent Labs  Lab 03/24/20 0835 03/24/20 0845 03/27/20 0253  WBC 24.4*  --  8.4  NEUTROABS 22.6*  --   --  HGB 10.7* 12.2 10.7*  HCT 35.9* 36.0 32.9*  MCV 99.2  --  90.9  PLT 325  --  160    Basic Metabolic Panel: Recent Labs  Lab 03/24/20 2031 03/25/20 0025 03/25/20 0430 03/26/20 0324 03/27/20 0253  NA 136 136 136 135 139  K 3.6 3.7 3.6 3.5 3.5  CL 99 99 101 100 102  CO2 25 27 26 26 27   GLUCOSE 240* 145* 143* 92 59*  BUN 69* 72* 64* 36* 17  CREATININE 1.88* 1.72* 1.63* 1.08* 0.76  CALCIUM 8.1* 8.0* 8.1* 8.0* 8.4*  MG  --   --  2.4  --   --   PHOS  --   --  1.9* 1.6* 2.5    GFR: Estimated Creatinine Clearance: 66.8 mL/min (by C-G formula based on SCr of 0.76 mg/dL).  Liver Function Tests: Recent Labs  Lab 03/25/20 0430  AST 24  ALT 34  ALKPHOS 77  BILITOT 0.5  PROT 5.0*  ALBUMIN 2.7*     CBG: Recent Labs  Lab 03/26/20 2116 03/27/20 0617 03/27/20 0722 03/27/20 1136 03/27/20 1639  GLUCAP 191* 36* 95 317* 131*     Recent Results (from the past 240 hour(s))  Resp Panel by RT-PCR (Flu A&B, Covid) Nasopharyngeal Swab     Status: None   Collection Time: 03/24/20  8:35 AM   Specimen: Nasopharyngeal Swab; Nasopharyngeal(NP) swabs in vial transport medium  Result Value Ref Range Status   SARS Coronavirus 2 by RT PCR NEGATIVE NEGATIVE Final    Comment: (NOTE) SARS-CoV-2 target nucleic acids are NOT DETECTED.  The SARS-CoV-2 RNA is generally detectable in upper respiratory specimens during the acute phase of infection. The lowest concentration of SARS-CoV-2 viral copies this assay can detect is 138 copies/mL. A negative result does not preclude SARS-Cov-2 infection and should not be used as the sole basis for treatment or other patient management decisions. A negative result may occur with  improper specimen collection/handling, submission of specimen other than nasopharyngeal swab, presence of viral mutation(s) within the areas targeted by this assay, and inadequate number of viral copies(<138 copies/mL). A negative result must be combined with clinical observations, patient history, and epidemiological information. The expected result is Negative.  Fact Sheet for Patients:  EntrepreneurPulse.com.au  Fact Sheet for Healthcare Providers:  IncredibleEmployment.be  This test is no t yet approved or cleared by the [place] FDA and  has been authorized for detection and/or diagnosis of SARS-CoV-2 by FDA under an Emergency Use Authorization (EUA). This EUA will remain  in effect (meaning this test can be used) for the duration of the COVID-19 declaration under Section 564(b)(1) of the Act, 21 U.S.C.section 360bbb-3(b)(1), unless the authorization is terminated  or revoked sooner.       Influenza A by PCR NEGATIVE NEGATIVE  Final   Influenza B by PCR NEGATIVE NEGATIVE Final    Comment: (NOTE) The Xpert Xpress SARS-CoV-2/FLU/RSV plus assay is intended as an aid in the diagnosis of influenza from Nasopharyngeal swab specimens and should not be used as a sole basis for treatment. Nasal washings and aspirates are unacceptable for Xpert Xpress SARS-CoV-2/FLU/RSV testing.  Fact Sheet for Patients: EntrepreneurPulse.com.au  Fact Sheet for Healthcare Providers: IncredibleEmployment.be  This test is not yet approved or cleared by the [place] FDA and has been authorized for detection and/or diagnosis of SARS-CoV-2 by FDA under an Emergency Use Authorization (EUA). This EUA will remain in effect (meaning this test can be used) for the duration of the  COVID-19 declaration under Section 564(b)(1) of the Act, 21 U.S.C. section 360bbb-3(b)(1), unless the authorization is terminated or revoked.  Performed at [facility], [place] [address], [place], [place] [address]   MRSA PCR Screening     Status: None   Collection Time: 03/24/20  2:46 PM   Specimen: Nasal Mucosa; Nasopharyngeal  Result Value Ref Range Status   MRSA by PCR NEGATIVE NEGATIVE Final    Comment:        The GeneXpert MRSA Assay (FDA approved for NASAL specimens only), is one component of a comprehensive MRSA colonization surveillance program. It is not intended to diagnose MRSA infection nor to guide or monitor treatment for MRSA infections. Performed at [facility], [place] [address], [place], [place] [address]          Radiology Studies: No results found.      Scheduled Meds: . enoxaparin (LOVENOX) injection  40 mg Subcutaneous Q24H  . insulin aspart  0-5 Units Subcutaneous QHS  . insulin aspart  0-9 Units Subcutaneous TID WC  . insulin aspart  2 Units Subcutaneous TID WC  . insulin NPH Human  8 Units Subcutaneous BID AC & HS  . polyethylene glycol  17  g Oral Daily  . potassium & sodium phosphates  1 packet Oral TID WC & HS  . pravastatin  40 mg Oral Daily  . senna-docusate  2 tablet Oral BID   Continuous Infusions:    LOS: 3 days        Hosie Poisson, MD [facility]   To contact the attending provider between 7A-7P or the covering provider during after hours 7P-7A, please log into the web site [email or web address] and access using universal [place] password for that web site. If you do not have the password, please call the hospital operator.  03/27/2020, 5:10 PM

## 2020-03-27 NOTE — Progress Notes (Signed)
Hypoglycemic Event  CBG: 36 (capillary), 59 (BG)  Treatment: Gave pt some OJ and graham crackers with peanut butter  Symptoms: none  Follow-up CBG: Time: CBG Result:  Possible Reasons for Event: too high dose of novlin N insulin  Comments/MD notified:n/a    Nelida Meuse

## 2020-03-27 NOTE — Care Management Important Message (Signed)
Important Message  Patient Details IM Letter given to the Patient Name: Mandy Mullen MRN: [id number] Date of Birth: 09-May-1950   Medicare Important Message Given:  Yes     Kerin Salen 03/27/2020, 11:02 AM

## 2020-03-27 NOTE — Progress Notes (Signed)
Inpatient Diabetes Program Recommendations  AACE/ADA: New Consensus Statement on Inpatient Glycemic Control (2015)  Target Ranges:  Prepandial:   less than 140 mg/dL      Peak postprandial:   less than 180 mg/dL (1-2 hours)      Critically ill patients:  140 - 180 mg/dL   Lab Results  Component Value Date   GLUCAP 95 03/27/2020   HGBA1C 13.6 (A) 03/12/2020    Review of Glycemic Control Results for Mandy Mullen, Mandy Mullen (MRN [id number]) as of 03/27/2020 07:54  Ref. Range 03/26/2020 13:31 03/26/2020 17:03 03/26/2020 21:16 03/27/2020 06:17 03/27/2020 07:22  Glucose-Capillary Latest Ref Range: 70 - 99 mg/dL 197 (H) 263 (H) 191 (H) 36 (LL) 95   Diabetes history:DM2 Outpatient Diabetes medications:NPH 35 units am + 25 units pm Current orders for Inpatient glycemic control:NPH 15 units pm+ Novolog 0-9 units tid + hs 0-5 units  Inpatient Diabetes Program Recommendations:   Noted hypoglycemiaConsider: -NPH 8 Units bid  Thank you, Nani Gasser. Shelbi Vaccaro, RN, MSN, CDE  Diabetes Coordinator Inpatient Glycemic Control Team Team Pager [phone] (8am-5pm) 03/27/2020 7:56 AM

## 2020-03-28 DIAGNOSIS — E875 Hyperkalemia: Secondary | ICD-10-CM

## 2020-03-28 LAB — GLUCOSE, CAPILLARY
Glucose-Capillary: 218 mg/dL — ABNORMAL HIGH (ref 70–99)
Glucose-Capillary: 248 mg/dL — ABNORMAL HIGH (ref 70–99)

## 2020-03-28 LAB — PHOSPHORUS: Phosphorus: 2.7 mg/dL (ref 2.5–4.6)

## 2020-03-28 MED ORDER — SENNOSIDES-DOCUSATE SODIUM 8.6-50 MG PO TABS: 2.0000 | ORAL_TABLET | Freq: Every evening | ORAL | Status: DC | PRN

## 2020-03-28 MED ORDER — INSULIN NPH (HUMAN) (ISOPHANE) 100 UNIT/ML ~~LOC~~ SUSP: 15.0000 [IU] | Freq: Two times a day (BID) | SUBCUTANEOUS | 11 refills | Status: DC

## 2020-03-28 MED ORDER — INSULIN ASPART 100 UNIT/ML FLEXPEN: PEN_INJECTOR | SUBCUTANEOUS | 11 refills | Status: DC

## 2020-03-28 MED ORDER — "PEN NEEDLES 1/2"" 29G X 12MM MISC": 3 refills | Status: DC

## 2020-03-28 NOTE — Plan of Care (Signed)
  Problem: Clinical Measurements: Goal: Respiratory complications will improve Outcome: Progressing   Problem: Clinical Measurements: Goal: Cardiovascular complication will be avoided Outcome: Progressing   Problem: Nutrition: Goal: Adequate nutrition will be maintained Outcome: Progressing   Problem: Elimination: Goal: Will not experience complications related to bowel motility Outcome: Progressing

## 2020-03-28 NOTE — Plan of Care (Signed)
Pt stable with no needs. Pt do d/c home when ride is available.

## 2020-03-28 NOTE — Progress Notes (Signed)
Pt stable at time of discharge instructions and education. No needs at this time.

## 2020-03-29 NOTE — Discharge Summary (Signed)
Physician Discharge Summary  Mandy Mullen GNF:[id number] DOB: Mar 29, 1950 DOA: 03/24/2020  PCP: Mandy Groves, DO  Admit date: 03/24/2020 Discharge date: 03/28/2020  Admitted From: Home.  Disposition: Home.   Recommendations for Outpatient Follow-up:  1. Follow up with PCP in 1-2 weeks 2. Please obtain BMP/CBC in one week Please follow up with endocrinology in one week.   Discharge Condition:stable.  CODE STATUS:full code.  Diet recommendation: Heart Healthy / Carb Modified Brief/Interim Summary: 70 y.o.femalewith a known history of hypertension, hyperlipidemia, insulin-dependent type 2 diabetes, CKD stage IIIais being admitted for DKA. DKA resolved.   Discharge Diagnoses:  Active Problems:   Diabetic keto-acidosis (Mandy Mullen)   Severe DKA Resolved She was initially started on GTT and transition to NovoLog 70/30. CBG (last 3)  Recent Labs    03/27/20 2200 03/28/20 0734 03/28/20 1119  GLUCAP 249* 218* 248*    Continue with sliding scale insulin in addition to the NovoLog 70/30. Bicarb is 26, anion gap is closed Beta hydroxybutyric acid is 0.49, . She was on novolog 8 units BID, increased to 15 units BID. DISCUSSED with the patient, recommended to take a  midnight snack.     Hypomagnesemia Replaced.   Hypophosphatemia:  Replaced.    Hypertension Blood pressure parameters appear to be optimal    Acute on stage IIIa CKD probably secondary to DKA Improving with IV fluids. Creatinine improved to 0.76, was around 2.2 on admission.  D/c IV fluids.  Recheck renal parameters show improvement.     Anemia of chronic disease:  Hemoglobin is stable around 10.    Leukocytosis:  Resolved.      Discharge Instructions  Discharge Instructions    Amb Referral to Nutrition and Diabetic Education   Complete by: As directed    Diet - low sodium heart healthy   Complete by: As directed    Discharge instructions   Complete by: As directed     Follow up with PCP in one week.     Allergies as of 03/28/2020      Reactions   Lisinopril Itching, Swelling, Rash   Angioedema and diffuse body rash   Bactrim [sulfamethoxazole-trimethoprim]    Erythroderma Mullen reaction over 80% of her body    Eggs Or Egg-derived Products    REACTION: Nausea. Chills/fevers x 6 months (after a flu shot in the 1990s).   Shellfish Allergy    Metformin And Related Rash      Medication List    TAKE these medications   amLODipine 10 MG tablet Commonly known as: NORVASC Take 1 tablet (10 mg total) by mouth daily. IM PROGRAM   glucose blood test strip Commonly known as: Contour Next Test Test blood sugar 3 times daily. IM program   insulin aspart 100 UNIT/ML FlexPen Commonly known as: NOVOLOG CBG 70 - 120: 0 units CBG 121 - 150: 1 unit CBG 151 - 200: 2 units CBG 201 - 250: 3 units CBG 251 - 300: 5 units CBG 301 - 350: 7 units CBG 351 - 400: 9 units   insulin NPH Human 100 UNIT/ML injection Commonly known as: NOVOLIN N Inject 0.15 mLs (15 Units total) into the skin 2 (two) times daily at 8 am and 10 pm. What changed:   how much to take  when to take this  additional instructions   INSULIN SYRINGE .3CC/31GX5/16" 31G X 5/16" 0.3 ML Misc 1 Dose by Does not apply route 2 (two) times daily. IM program   losartan-hydrochlorothiazide 100-25 MG tablet  Commonly known as: HYZAAR Take 1 tablet by mouth daily.   ONE TOUCH LANCETS Misc Use to check blood sugars   Microlet Lancets Misc 1 Device by Does not apply route 3 (three) times daily.   PEN NEEDLES 29GX1/2" 29G X 12MM Misc To use for novolog injection TIDAC.   pravastatin 40 MG tablet Commonly known as: PRAVACHOL Take 40 mg by mouth daily.   senna-docusate 8.6-50 MG tablet Commonly known as: Senokot-S Take 2 tablets by mouth at bedtime as needed for mild constipation.       Allergies  Allergen Reactions  . Lisinopril Itching, Swelling and Rash    Angioedema and diffuse  body rash  . Bactrim [Sulfamethoxazole-Trimethoprim]     Erythroderma Mullen reaction over 80% of her body   . Eggs Or Egg-Derived Products     REACTION: Nausea. Chills/fevers x 6 months (after a flu shot in the 1990s).  . Shellfish Allergy   . Metformin And Related Rash    Consultations:  None.    Procedures/Studies: DG Chest Port 1 View  Result Date: 03/24/2020 CLINICAL DATA:  70 year old female with weakness, hyperglycemia. EXAM: PORTABLE CHEST 1 VIEW COMPARISON:  Chest radiograph 07/05/2013. FINDINGS: Portable AP semi upright view at 0822 hours. Low lung volumes. Heart size and mediastinal contours remain within normal limits. Visualized tracheal air column is within normal limits. Allowing for portable technique the lungs are clear. No pneumothorax. Negative visible bowel gas, osseous structures. IMPRESSION: Low lung volumes. No acute cardiopulmonary abnormality. Electronically Signed   By: Mandy Mullen M.D.   On: 03/24/2020 08:36   ECHOCARDIOGRAM COMPLETE  Result Date: 03/24/2020    ECHOCARDIOGRAM REPORT   Patient Name:   Mandy Mullen Date of Exam: 03/24/2020 Medical Rec #:  [id number]        Height:       63.0 in Accession #:    [id number]       Weight:       173.5 lb Date of Birth:  01-22-1950        BSA:          1.820 m Patient Age:    70 years         BP:           95/52 mmHg Patient Gender: F                HR:           91 bpm. Exam Location:  Inpatient Procedure: 2D Echo, 3D Echo, Cardiac Doppler and Color Doppler Indications:    R06.02 SOB. Diabetic ketoacidosis.  History:        Patient has no prior history of Echocardiogram examinations.                 Signs/Symptoms:Dyspnea; Risk Factors:Diabetes, Hypertension and                 Dyslipidemia. Diabetic ketoacidosis.  Sonographer:    Roseanna Rainbow RDCS Referring Phys: [phone] Mandy Mullen  Sonographer Comments: Technically difficult study due to poor echo windows. Image acquisition challenging due to patient body habitus. Supine.  IMPRESSIONS  1. There is a dynamic LV gradient that increases with valsalva. The peak gradient measured was 22 mmHg.     Marland Kitchen Left ventricular ejection fraction, by estimation, is 65 to 70%. The left ventricle has normal function. The left ventricle has no regional wall motion abnormalities. Left ventricular diastolic parameters were normal.  2. Right ventricular systolic function is normal. The  right ventricular size is normal. There is mildly elevated pulmonary artery systolic pressure.  3. The mitral valve is normal in structure. No evidence of mitral valve regurgitation. No evidence of mitral stenosis.  4. The aortic valve is grossly normal. Aortic valve regurgitation is not visualized. FINDINGS  Left Ventricle: There is a dynamic LV gradient that increases with valsalva. The peak gradient measured was 22 mmHg. Left ventricular ejection fraction, by estimation, is 65 to 70%. The left ventricle has normal function. The left ventricle has no regional wall motion abnormalities. The left ventricular internal cavity size was normal in size. There is no left ventricular hypertrophy. Left ventricular diastolic parameters were normal. Right Ventricle: The right ventricular size is normal. No increase in right ventricular wall thickness. Right ventricular systolic function is normal. There is mildly elevated pulmonary artery systolic pressure. The tricuspid regurgitant velocity is 2.90  m/s, and with an assumed right atrial pressure of 8 mmHg, the estimated right ventricular systolic pressure is 29.5 mmHg. Left Atrium: Left atrial size was normal in size. Right Atrium: Right atrial size was normal in size. Pericardium: There is no evidence of pericardial effusion. Mitral Valve: The mitral valve is normal in structure. No evidence of mitral valve regurgitation. No evidence of mitral valve stenosis. Tricuspid Valve: The tricuspid valve is normal in structure. Tricuspid valve regurgitation is mild. Aortic Valve: The aortic  valve is grossly normal. Aortic valve regurgitation is not visualized. Pulmonic Valve: The pulmonic valve was grossly normal. Pulmonic valve regurgitation is not visualized. Aorta: The aortic root and ascending aorta are structurally normal, with no evidence of dilitation. IAS/Shunts: The atrial septum is grossly normal.  LEFT VENTRICLE PLAX 2D LVIDd:         3.30 cm     Diastology LVIDs:         2.10 cm     LV e' medial:    8.81 cm/s LV PW:         1.10 cm     LV E/e' medial:  12.9 LV IVS:        1.10 cm     LV e' lateral:   9.57 cm/s LVOT diam:     1.90 cm     LV E/e' lateral: 11.9 LV SV:         91 LV SV Index:   50 LVOT Area:     2.84 cm                             3D Volume EF: LV Volumes (MOD)           3D EF:        69 % LV vol d, MOD A2C: 38.7 ml LV EDV:       86 ml LV vol d, MOD A4C: 48.3 ml LV ESV:       27 ml LV vol s, MOD A2C: 8.7 ml  LV SV:        59 ml LV vol s, MOD A4C: 15.1 ml LV SV MOD A2C:     30.0 ml LV SV MOD A4C:     48.3 ml LV SV MOD BP:      32.5 ml RIGHT VENTRICLE             IVC RV S prime:     13.80 cm/s  IVC diam: 2.70 cm TAPSE (M-mode): 1.5 cm LEFT ATRIUM  Index       RIGHT ATRIUM          Index LA diam:        2.50 cm 1.37 cm/m  RA Area:     9.84 cm LA Vol (A2C):   27.2 ml 14.94 ml/m RA Volume:   20.80 ml 11.43 ml/m LA Vol (A4C):   35.3 ml 19.39 ml/m LA Biplane Vol: 31.3 ml 17.19 ml/m  AORTIC VALVE LVOT Vmax:   161.00 cm/s LVOT Vmean:  104.000 cm/s LVOT VTI:    0.321 m  AORTA Ao Root diam: 3.10 cm Ao Asc diam:  3.00 cm MITRAL VALVE                TRICUSPID VALVE MV Area (PHT): 2.76 cm     TR Peak grad:   33.6 mmHg MV Decel Time: 275 msec     TR Vmax:        290.00 cm/s MV E velocity: 114.00 cm/s MV A velocity: 118.00 cm/s  SHUNTS MV E/A ratio:  0.97         Systemic VTI:  0.32 m                             Systemic Diam: 1.90 cm Mertie Moores MD Electronically signed by Mertie Moores MD Signature Date/Time: 03/24/2020/4:22:48 PM    Final    VAS [name] LOWER EXTREMITY  VENOUS (DVT)  Result Date: 03/24/2020  Lower Venous DVT Study Indications: SOB - however O2 is 100% on room air.  Comparison Study: No previous exams Performing Technologist: Rogelia Rohrer  Examination Guidelines: A complete evaluation includes B-mode imaging, spectral Doppler, color Doppler, and power Doppler as needed of all accessible portions of each vessel. Bilateral testing is considered an integral part of a complete examination. Limited examinations for reoccurring indications may be performed as noted. The reflux portion of the exam is performed with the patient in reverse Trendelenburg.  +---------+---------------+---------+-----------+----------+--------------+ RIGHT    CompressibilityPhasicitySpontaneityPropertiesThrombus Aging +---------+---------------+---------+-----------+----------+--------------+ CFV      Full           Yes      Yes                                 +---------+---------------+---------+-----------+----------+--------------+ SFJ      Full                                                        +---------+---------------+---------+-----------+----------+--------------+ FV Prox  Full           Yes      Yes                                 +---------+---------------+---------+-----------+----------+--------------+ FV Mid   Full           Yes      Yes                                 +---------+---------------+---------+-----------+----------+--------------+ FV DistalFull           Yes      Yes                                 +---------+---------------+---------+-----------+----------+--------------+  PFV      Full                                                        +---------+---------------+---------+-----------+----------+--------------+ POP      Full           Yes      Yes                                 +---------+---------------+---------+-----------+----------+--------------+ PTV      Full                                                         +---------+---------------+---------+-----------+----------+--------------+ PERO     Full                                                        +---------+---------------+---------+-----------+----------+--------------+   +---------+---------------+---------+-----------+----------+--------------+ LEFT     CompressibilityPhasicitySpontaneityPropertiesThrombus Aging +---------+---------------+---------+-----------+----------+--------------+ CFV      Full           Yes      Yes                                 +---------+---------------+---------+-----------+----------+--------------+ SFJ      Full                                                        +---------+---------------+---------+-----------+----------+--------------+ FV Prox  Full           Yes      Yes                                 +---------+---------------+---------+-----------+----------+--------------+ FV Mid   Full           Yes      Yes                                 +---------+---------------+---------+-----------+----------+--------------+ FV DistalFull           Yes      Yes                                 +---------+---------------+---------+-----------+----------+--------------+ PFV      Full                                                        +---------+---------------+---------+-----------+----------+--------------+  POP      Full           Yes      Yes                                 +---------+---------------+---------+-----------+----------+--------------+ PTV      Full                                                        +---------+---------------+---------+-----------+----------+--------------+ PERO     Full                                                        +---------+---------------+---------+-----------+----------+--------------+     Summary: BILATERAL: - No evidence of deep vein thrombosis seen in the lower extremities,  bilaterally. - No evidence of superficial venous thrombosis in the lower extremities, bilaterally. -No evidence of popliteal cyst, bilaterally.   *See table(s) above for measurements and observations. Electronically signed by Deitra Mayo MD on 03/24/2020 at 4:58:56 PM.    Final      Subjective: No new complaints.   Discharge Exam: Vitals:   03/28/20 0557 03/28/20 1240  BP: 129/69 138/79  Pulse: 86 92  Resp: 16 16  Temp: 98.2 F (36.8 C) 97.9 F (36.6 C)  SpO2: 98% 100%   Vitals:   03/27/20 1258 03/27/20 2157 03/28/20 0557 03/28/20 1240  BP: 136/82 131/68 129/69 138/79  Pulse: 88 95 86 92  Resp: 17 16 16 16   Temp: 99 F (37.2 C) 99 F (37.2 C) 98.2 F (36.8 C) 97.9 F (36.6 C)  TempSrc: Oral Oral Oral   SpO2: 99% 96% 98% 100%  Weight:      Height:        General: Pt is alert, awake, not in acute distress Cardiovascular: RRR, S1/S2 +, no rubs, no gallops Respiratory: CTA bilaterally, no wheezing, no rhonchi Abdominal: Soft, NT, ND, bowel sounds + Extremities: no edema, no cyanosis    The results of significant diagnostics from this hospitalization (including imaging, microbiology, ancillary and laboratory) are listed below for reference.     Microbiology: Recent Results (from the past 240 hour(s))  Resp Panel by RT-PCR (Flu A&B, Covid) Nasopharyngeal Swab     Status: None   Collection Time: 03/24/20  8:35 AM   Specimen: Nasopharyngeal Swab; Nasopharyngeal(NP) swabs in vial transport medium  Result Value Ref Range Status   SARS Coronavirus 2 by RT PCR NEGATIVE NEGATIVE Final    Comment: (NOTE) SARS-CoV-2 target nucleic acids are NOT DETECTED.  The SARS-CoV-2 RNA is generally detectable in upper respiratory specimens during the acute phase of infection. The lowest concentration of SARS-CoV-2 viral copies this assay can detect is 138 copies/mL. A negative result does not preclude SARS-Cov-2 infection and should not be used as the sole basis for treatment  or other patient management decisions. A negative result may occur with  improper specimen collection/handling, submission of specimen other than nasopharyngeal swab, presence of viral mutation(s) within the areas targeted by this assay, and inadequate number of viral copies(<138 copies/mL). A negative result must be combined with clinical observations,  patient history, and epidemiological information. The expected result is Negative.  Fact Sheet for Patients:  EntrepreneurPulse.com.au  Fact Sheet for Healthcare Providers:  IncredibleEmployment.be  This test is no t yet approved or cleared by the [place] FDA and  has been authorized for detection and/or diagnosis of SARS-CoV-2 by FDA under an Emergency Use Authorization (EUA). This EUA will remain  in effect (meaning this test can be used) for the duration of the COVID-19 declaration under Section 564(b)(1) of the Act, 21 U.S.C.section 360bbb-3(b)(1), unless the authorization is terminated  or revoked sooner.       Influenza A by PCR NEGATIVE NEGATIVE Final   Influenza B by PCR NEGATIVE NEGATIVE Final    Comment: (NOTE) The Xpert Xpress SARS-CoV-2/FLU/RSV plus assay is intended as an aid in the diagnosis of influenza from Nasopharyngeal swab specimens and should not be used as a sole basis for treatment. Nasal washings and aspirates are unacceptable for Xpert Xpress SARS-CoV-2/FLU/RSV testing.  Fact Sheet for Patients: EntrepreneurPulse.com.au  Fact Sheet for Healthcare Providers: IncredibleEmployment.be  This test is not yet approved or cleared by the [place] FDA and has been authorized for detection and/or diagnosis of SARS-CoV-2 by FDA under an Emergency Use Authorization (EUA). This EUA will remain in effect (meaning this test can be used) for the duration of the COVID-19 declaration under Section 564(b)(1) of the Act, 21 U.S.C. section  360bbb-3(b)(1), unless the authorization is terminated or revoked.  Performed at [facility], [place] [address], [place], [place] [address]   MRSA PCR Screening     Status: None   Collection Time: 03/24/20  2:46 PM   Specimen: Nasal Mucosa; Nasopharyngeal  Result Value Ref Range Status   MRSA by PCR NEGATIVE NEGATIVE Final    Comment:        The GeneXpert MRSA Assay (FDA approved for NASAL specimens only), is one component of a comprehensive MRSA colonization surveillance program. It is not intended to diagnose MRSA infection nor to guide or monitor treatment for MRSA infections. Performed at [facility], [place] [address] [place], [place] [address]      Labs: BNP (last 3 results) No results for input(s): BNP in the last 8760 hours. Basic Metabolic Panel: Recent Labs  Lab 03/24/20 2031 03/25/20 0025 03/25/20 0430 03/26/20 0324 03/27/20 0253 03/28/20 0239  NA 136 136 136 135 139  --   K 3.6 3.7 3.6 3.5 3.5  --   CL 99 99 101 100 102  --   CO2 25 27 26 26 27   --   GLUCOSE 240* 145* 143* 92 59*  --   BUN 69* 72* 64* 36* 17  --   CREATININE 1.88* 1.72* 1.63* 1.08* 0.76  --   CALCIUM 8.1* 8.0* 8.1* 8.0* 8.4*  --   MG  --   --  2.4  --   --   --   PHOS  --   --  1.9* 1.6* 2.5 2.7   Liver Function Tests: Recent Labs  Lab 03/25/20 0430  AST 24  ALT 34  ALKPHOS 77  BILITOT 0.5  PROT 5.0*  ALBUMIN 2.7*   No results for input(s): LIPASE, AMYLASE in the last 168 hours. No results for input(s): AMMONIA in the last 168 hours. CBC: Recent Labs  Lab 03/24/20 0835 03/24/20 0845 03/27/20 0253  WBC 24.4*  --  8.4  NEUTROABS 22.6*  --   --   HGB 10.7* 12.2 10.7*  HCT 35.9* 36.0 32.9*  MCV 99.2  --  90.9  PLT 325  --  220   Cardiac Enzymes: No results for input(s): CKTOTAL, CKMB, CKMBINDEX, TROPONINI in the last 168 hours. BNP: Invalid input(s): POCBNP CBG: Recent Labs  Lab 03/27/20 1136 03/27/20 1639 03/27/20 2200  03/28/20 0734 03/28/20 1119  GLUCAP 317* 131* 249* 218* 248*   D-Dimer No results for input(s): DDIMER in the last 72 hours. Hgb A1c No results for input(s): HGBA1C in the last 72 hours. Lipid Profile No results for input(s): CHOL, HDL, LDLCALC, TRIG, CHOLHDL, LDLDIRECT in the last 72 hours. Thyroid function studies No results for input(s): TSH, T4TOTAL, T3FREE, THYROIDAB in the last 72 hours.  Invalid input(s): FREET3 Anemia work up No results for input(s): VITAMINB12, FOLATE, FERRITIN, TIBC, IRON, RETICCTPCT in the last 72 hours. Urinalysis    Component Value Date/Time   COLORURINE YELLOW 04/09/2012 1648   APPEARANCEUR CLEAR 04/09/2012 1648   LABSPEC 1.025 04/09/2012 1648   PHURINE 5.5 04/09/2012 1648   GLUCOSEU 500 (A) 04/09/2012 1648   HGBUR NEG 04/09/2012 1648   BILIRUBINUR NEG 04/09/2012 1648   KETONESUR NEG 04/09/2012 1648   PROTEINUR NEG 04/09/2012 1648   UROBILINOGEN 0.2 04/09/2012 1648   NITRITE NEG 04/09/2012 1648   LEUKOCYTESUR NEG 04/09/2012 1648   Sepsis Labs Invalid input(s): PROCALCITONIN,  WBC,  LACTICIDVEN Microbiology Recent Results (from the past 240 hour(s))  Resp Panel by RT-PCR (Flu A&B, Covid) Nasopharyngeal Swab     Status: None   Collection Time: 03/24/20  8:35 AM   Specimen: Nasopharyngeal Swab; Nasopharyngeal(NP) swabs in vial transport medium  Result Value Ref Range Status   SARS Coronavirus 2 by RT PCR NEGATIVE NEGATIVE Final    Comment: (NOTE) SARS-CoV-2 target nucleic acids are NOT DETECTED.  The SARS-CoV-2 RNA is generally detectable in upper respiratory specimens during the acute phase of infection. The lowest concentration of SARS-CoV-2 viral copies this assay can detect is 138 copies/mL. A negative result does not preclude SARS-Cov-2 infection and should not be used as the sole basis for treatment or other patient management decisions. A negative result may occur with  improper specimen collection/handling, submission of specimen  other than nasopharyngeal swab, presence of viral mutation(s) within the areas targeted by this assay, and inadequate number of viral copies(<138 copies/mL). A negative result must be combined with clinical observations, patient history, and epidemiological information. The expected result is Negative.  Fact Sheet for Patients:  EntrepreneurPulse.com.au  Fact Sheet for Healthcare Providers:  IncredibleEmployment.be  This test is no t yet approved or cleared by the [place] FDA and  has been authorized for detection and/or diagnosis of SARS-CoV-2 by FDA under an Emergency Use Authorization (EUA). This EUA will remain  in effect (meaning this test can be used) for the duration of the COVID-19 declaration under Section 564(b)(1) of the Act, 21 U.S.C.section 360bbb-3(b)(1), unless the authorization is terminated  or revoked sooner.       Influenza A by PCR NEGATIVE NEGATIVE Final   Influenza B by PCR NEGATIVE NEGATIVE Final    Comment: (NOTE) The Xpert Xpress SARS-CoV-2/FLU/RSV plus assay is intended as an aid in the diagnosis of influenza from Nasopharyngeal swab specimens and should not be used as a sole basis for treatment. Nasal washings and aspirates are unacceptable for Xpert Xpress SARS-CoV-2/FLU/RSV testing.  Fact Sheet for Patients: EntrepreneurPulse.com.au  Fact Sheet for Healthcare Providers: IncredibleEmployment.be  This test is not yet approved or cleared by the [place] FDA and has been authorized for detection and/or  diagnosis of SARS-CoV-2 by FDA under an Emergency Use Authorization (EUA). This EUA will remain in effect (meaning this test can be used) for the duration of the COVID-19 declaration under Section 564(b)(1) of the Act, 21 U.S.C. section 360bbb-3(b)(1), unless the authorization is terminated or revoked.  Performed at [facility], [place] [address], [place], [place] [address]   MRSA PCR Screening     Status: None   Collection Time: 03/24/20  2:46 PM   Specimen: Nasal Mucosa; Nasopharyngeal  Result Value Ref Range Status   MRSA by PCR NEGATIVE NEGATIVE Final    Comment:        The GeneXpert MRSA Assay (FDA approved for NASAL specimens only), is one component of a comprehensive MRSA colonization surveillance program. It is not intended to diagnose MRSA infection nor to guide or monitor treatment for MRSA infections. Performed at [facility], [place] [address], [place], [place] [address]      Time coordinating discharge: 32 minutes.  SIGNED:   Hosie Poisson, MD  [facility]

## 2020-03-30 ENCOUNTER — Telehealth: Payer: Self-pay

## 2020-03-30 NOTE — Telephone Encounter (Signed)
Transition Care Management Follow-up Telephone Call  Date of discharge and from where: 03/28/2020 from [facility]  How have you been since you were released from the hospital? Pt stated that she is feeling better.   Any questions or concerns? No  Items Reviewed:  Did the pt receive and understand the discharge instructions provided? Yes   Medications obtained and verified? Yes   Other? No   Any new allergies since your discharge? No   Dietary orders reviewed? n/a  Do you have support at home? Yes    Functional Questionnaire: (I = Independent and D = Dependent) ADLs: I  Bathing/Dressing- I  Meal Prep- I  Eating- I  Maintaining continence- I  Transferring/Ambulation- I  Managing Meds- I  Follow up appointments reviewed:   PCP Hospital f/u appt confirmed? No  Pt stated that she will reach out to PCP today for a follow up appointment..  Are transportation arrangements needed? No   If their condition worsens, is the pt aware to call PCP or go to the Emergency Dept.? Yes  Was the patient provided with contact information for the PCP's office or ED? Yes  Was to pt encouraged to call back with questions or concerns? Yes

## 2020-03-31 ENCOUNTER — Emergency Department (HOSPITAL_COMMUNITY)
Admission: EM | Admit: 2020-03-31 | Discharge: 2020-03-31 | Disposition: A | Discharge status: Home / Self Care | Payer: Self-pay | Attending: Emergency Medicine | Admitting: Emergency Medicine

## 2020-03-31 DIAGNOSIS — E11649 Type 2 diabetes mellitus with hypoglycemia without coma: Secondary | ICD-10-CM | POA: Insufficient documentation

## 2020-03-31 DIAGNOSIS — E162 Hypoglycemia, unspecified: Secondary | ICD-10-CM

## 2020-03-31 DIAGNOSIS — N183 Chronic kidney disease, stage 3 unspecified: Secondary | ICD-10-CM | POA: Insufficient documentation

## 2020-03-31 DIAGNOSIS — I129 Hypertensive chronic kidney disease with stage 1 through stage 4 chronic kidney disease, or unspecified chronic kidney disease: Secondary | ICD-10-CM | POA: Insufficient documentation

## 2020-03-31 DIAGNOSIS — E1122 Type 2 diabetes mellitus with diabetic chronic kidney disease: Secondary | ICD-10-CM | POA: Insufficient documentation

## 2020-03-31 DIAGNOSIS — Z6830 Body mass index (BMI) 30.0-30.9, adult: Secondary | ICD-10-CM | POA: Insufficient documentation

## 2020-03-31 DIAGNOSIS — E669 Obesity, unspecified: Secondary | ICD-10-CM | POA: Insufficient documentation

## 2020-03-31 DIAGNOSIS — Z794 Long term (current) use of insulin: Secondary | ICD-10-CM | POA: Insufficient documentation

## 2020-03-31 DIAGNOSIS — Z79899 Other long term (current) drug therapy: Secondary | ICD-10-CM | POA: Insufficient documentation

## 2020-03-31 LAB — CBC WITH DIFFERENTIAL/PLATELET
Abs Immature Granulocytes: 0.1 10*3/uL — ABNORMAL HIGH (ref 0.00–0.07)
Basophils Absolute: 0 10*3/uL (ref 0.0–0.1)
Basophils Relative: 1 %
Eosinophils Absolute: 0 10*3/uL (ref 0.0–0.5)
Eosinophils Relative: 0 %
HCT: 36.1 % (ref 36.0–46.0)
Hemoglobin: 11.7 g/dL — ABNORMAL LOW (ref 12.0–15.0)
Immature Granulocytes: 1 %
Lymphocytes Relative: 24 %
Lymphs Abs: 1.7 10*3/uL (ref 0.7–4.0)
MCH: 29.3 pg (ref 26.0–34.0)
MCHC: 32.4 g/dL (ref 30.0–36.0)
MCV: 90.3 fL (ref 80.0–100.0)
Monocytes Absolute: 0.7 10*3/uL (ref 0.1–1.0)
Monocytes Relative: 10 %
Neutro Abs: 4.7 10*3/uL (ref 1.7–7.7)
Neutrophils Relative %: 64 %
Platelets: 373 10*3/uL (ref 150–400)
RBC: 4 MIL/uL (ref 3.87–5.11)
RDW: 14.5 % (ref 11.5–15.5)
WBC: 7.3 10*3/uL (ref 4.0–10.5)
nRBC: 0 % (ref 0.0–0.2)

## 2020-03-31 LAB — COMPREHENSIVE METABOLIC PANEL
ALT: 52 U/L — ABNORMAL HIGH (ref 0–44)
AST: 41 U/L (ref 15–41)
Albumin: 3.1 g/dL — ABNORMAL LOW (ref 3.5–5.0)
Alkaline Phosphatase: 98 U/L (ref 38–126)
Anion gap: 9 (ref 5–15)
BUN: 14 mg/dL (ref 8–23)
CO2: 25 mmol/L (ref 22–32)
Calcium: 8.9 mg/dL (ref 8.9–10.3)
Chloride: 101 mmol/L (ref 98–111)
Creatinine, Ser: 0.98 mg/dL (ref 0.44–1.00)
GFR, Estimated: >60 mL/min (ref >60)
Glucose, Bld: 206 mg/dL — ABNORMAL HIGH (ref 70–99)
Potassium: 3.9 mmol/L (ref 3.5–5.1)
Sodium: 135 mmol/L (ref 135–145)
Total Bilirubin: 0.9 mg/dL (ref 0.3–1.2)
Total Protein: 6.5 g/dL (ref 6.5–8.1)

## 2020-03-31 LAB — CBG MONITORING, ED: Glucose-Capillary: 170 mg/dL — ABNORMAL HIGH (ref 70–99)

## 2020-03-31 NOTE — ED Notes (Signed)
Pt provided "ED Happy meal" per provider request.

## 2020-03-31 NOTE — ED Provider Notes (Incomplete)
I provided a substantive portion of the care of this patient.  I personally performed the entirety of the history, exam and medical decision making for this encounter.  EKG Interpretation  Date/Time:  Tuesday March 31 2020 09:26:50 EDT Ventricular Rate:  83 PR Interval:    QRS Duration: 74 QT Interval:  413 QTC Calculation: 486 R Axis:   32 Text Interpretation: Sinus rhythm Low voltage, precordial leads Borderline T abnormalities, anterior leads Borderline prolonged QT interval Confirmed by Elnora Morrison [phone]) on 03/31/2020 9:45:17 AM

## 2020-03-31 NOTE — ED Notes (Signed)
Pt d/c home per MD order. Discharge summary reviewed with pt, pt verbalizes understanding. Pt voicing no complaints at discharge. No s/s of acute distress noted. Reports discharge ride home.

## 2020-03-31 NOTE — ED Notes (Signed)
Pt was put on a pure wick

## 2020-03-31 NOTE — ED Provider Notes (Signed)
[facility] EMERGENCY DEPARTMENT Provider Note   CSN: [id number] Arrival date & time: 03/31/20  0920     History Chief Complaint  Patient presents with  . Hypoglycemia    [facility] is a 70 y.o. female diabetes type 2, kidney stage III, hypertension, hyperlipidemia, normocytic anemia.  Patient presents with a chief complaint of hypoglycemia.  Patient's grandson called 911 this morning because patient was altered and not responding correctly.  Upon our department arrival patient CBG was 54, and she was given a half a tube with Insta-Glucose.  On EMS arrival patient's CBG was 60 and was not responding to questions appropriately.  Patient received 1 amp of D50 by EMS.  CBG found to be 256 with EMS.  On patient's arrival in emergency department CBG 170.  Patient is alert and x4 at this time.  Endorses her sugar is 611 last night so administered 15 units of Humalog at 2200.  When asked patient reports she is not sure what her sugar generally runs.  Patient denies any recent illness, alcohol use, or drug use.  Patient reports that she only uses 15 units twice daily at 0800 and 2200 with sliding scale insulin if needed.    Patient denies any fevers, chills, chest pain, shortness of breath, abdominal pain, nausea, vomiting, dysuria, urinary frequency, hematuria, lightheadedness, facial asymmetry, slurred speech, numbness or tingling to extremities, weakness to extremities.  Per chart review patient was admitted to [facility] department for severe DKA from 03/24/2020 to 03/28/2020.  Upon discharge patient's home NovoLovin was increased from 8 units twice daily to 15 units twice daily and patient was started on sliding scale novolog insulin.  Patient was recommended to take a midnight snack.      HPI     Past Medical History:  Diagnosis Date  . History of blood transfusion 1991   "related to hysterectomy/left ovary OR" (11/28/2017)  . History of herpes genitalis   .  Hyperlipidemia   . Hypertension   . MRSA (methicillin resistant Staphylococcus aureus)    Reccurent MRSA abscesses  . Type II diabetes mellitus [facility])     Patient Active Problem List   Diagnosis Date Noted  . Diabetic keto-acidosis ([place]) 03/24/2020  . Normocytic anemia 04/24/2019  . 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 CKD stage 3 due to type 2 diabetes mellitus ([place]) 07/29/2016  . Healthcare maintenance 02/26/2014  . Hypertension associated with diabetes ([place]) 12/17/2007  . Hyperlipidemia 01/13/2006  . Uncontrolled type 2 diabetes mellitus ([place]) 12/20/2005  . TOTAL ABDOMINAL HYSTERECTOMY, HX OF 12/20/2005    Past Surgical History:  Procedure Laterality Date  . ABDOMINAL HYSTERECTOMY  1991   "w/left ovary"  . INCISION AND DRAINAGE ABSCESS ANAL  04/2004  . LEFT OOPHORECTOMY Left 1991     OB History   No obstetric history on file.     Family History  Problem Relation Age of Onset  . Coronary artery disease Father   . Diabetes Sister   . Stroke Sister     Social History   Tobacco Use  . Smoking status: Never Smoker  . Smokeless tobacco: Never Used  Vaping Use  . Vaping Use: Never used  Substance Use Topics  . Alcohol use: Not Currently    Comment: 11/28/2017 "last drink was in 2006; never had problem w/alcohol".  . Drug use: Never    Home Medications Prior to Admission medications   Medication Sig Start Date End Date Taking? Authorizing Provider  amLODipine (  NORVASC) 10 MG tablet Take 1 tablet (10 mg total) by mouth daily. IM PROGRAM 03/12/20   Joni Reining C, DO  glucose blood (CONTOUR NEXT TEST) test strip Test blood sugar 3 times daily. IM program 06/26/17   Ledell Noss, MD  insulin aspart (NOVOLOG) 100 UNIT/ML FlexPen CBG 70 - 120: 0 units CBG 121 - 150: 1 unit CBG 151 - 200: 2 units CBG 201 - 250: 3 units CBG 251 - 300: 5 units CBG 301 - 350: 7 units CBG 351 - 400: 9 units 03/28/20   Hosie Poisson, MD  insulin NPH Human (NOVOLIN N) 100 UNIT/ML injection Inject  0.15 mLs (15 Units total) into the skin 2 (two) times daily at 8 am and 10 pm. 03/28/20   Hosie Poisson, MD  Insulin Pen Needle (PEN NEEDLES 29GX1/2") 29G X 12MM MISC To use for novolog injection TIDAC. 03/28/20   Hosie Poisson, MD  Insulin Syringe-Needle U-100 (INSULIN SYRINGE .3CC/31GX5/16") 31G X 5/16" 0.3 ML MISC 1 Dose by Does not apply route 2 (two) times daily. IM program 06/21/17   Ledell Noss, MD  losartan-hydrochlorothiazide [facility]) 100-25 MG tablet Take 1 tablet by mouth daily. 03/12/20   Lucious Groves, DO  MICROLET LANCETS MISC 1 Device by Does not apply route 3 (three) times daily. 06/26/17   Ledell Noss, MD  ONE TOUCH LANCETS MISC Use to check blood sugars 08/14/12   Wilber Oliphant, MD  pravastatin (PRAVACHOL) 40 MG tablet Take 40 mg by mouth daily.    [provider]  senna-docusate (SENOKOT-S) 8.6-50 MG tablet Take 2 tablets by mouth at bedtime as needed for mild constipation. 03/28/20   Hosie Poisson, MD    Allergies    Lisinopril, Bactrim [sulfamethoxazole-trimethoprim], Eggs or egg-derived products, Shellfish allergy, and Metformin and related  Review of Systems   Review of Systems  Constitutional: Negative for chills and fever.  Eyes: Negative for visual disturbance.  Respiratory: Negative for shortness of breath.   Cardiovascular: Negative for chest pain.  Gastrointestinal: Negative for abdominal pain, blood in stool, nausea and vomiting.  Genitourinary: Negative for difficulty urinating, dysuria, frequency and hematuria.  Musculoskeletal: Negative for back pain and neck pain.  Skin: Negative for color change and rash.  Neurological: Negative for seizures, syncope, facial asymmetry, speech difficulty, weakness, light-headedness, numbness and headaches.  Psychiatric/Behavioral: Negative for confusion.    Physical Exam Updated Vital Signs BP (!) 171/82   Pulse 83   Temp (!) 97.2 F (36.2 C) (Oral)   Resp 17   Ht 5\' 3"  (1.6 m)   Wt 77.1 kg   SpO2 100%   BMI  30.11 kg/m   Physical Exam Vitals and nursing note reviewed.  Constitutional:      General: She is not in acute distress.    Appearance: She is obese. She is not ill-appearing, toxic-appearing or diaphoretic.  HENT:     Head: Normocephalic and atraumatic. No raccoon eyes, abrasion, contusion, masses, right periorbital erythema, left periorbital erythema or laceration.     Jaw: No trismus or pain on movement.     Mouth/Throat:     Pharynx: Uvula midline.  Eyes:     General: No scleral icterus.       Right eye: No discharge.        Left eye: No discharge.     Extraocular Movements: Extraocular movements intact.     Pupils: Pupils are equal, round, and reactive to light.  Cardiovascular:     Rate and Rhythm: Normal  rate.  Pulmonary:     Effort: Pulmonary effort is normal. No tachypnea, bradypnea or respiratory distress.     Breath sounds: No stridor. No decreased breath sounds, wheezing, rhonchi or rales.  Abdominal:     General: There is no distension. There are no signs of injury.     Palpations: Abdomen is soft. There is no mass or pulsatile mass.     Tenderness: There is no abdominal tenderness. There is no guarding or rebound. Negative signs include McBurney's sign.     Hernia: There is no hernia in the umbilical area or ventral area.  Musculoskeletal:     Cervical back: Normal range of motion and neck supple. No rigidity.     Right lower leg: No swelling, deformity, lacerations, tenderness or bony tenderness. No edema.     Left lower leg: No swelling, deformity, lacerations, tenderness or bony tenderness. No edema.  Skin:    General: Skin is warm and dry.  Neurological:     General: No focal deficit present.     Mental Status: She is alert and oriented to person, place, and time.     GCS: GCS eye subscore is 4. GCS verbal subscore is 5. GCS motor subscore is 6.     Cranial Nerves: No cranial nerve deficit or facial asymmetry.     Sensory: Sensation is intact.     Motor: No  weakness, tremor, seizure activity or pronator drift.     Coordination: Finger-Nose-Finger Test normal.     Gait: Gait is intact. Gait normal.     Comments: CN II-XII intact; performed in supine position, +5 strength to bilateral upper extremities, +5 strength to dorsiflexion and plantarflexion, patient able to left both legs against gravity and hold each there without difficulty   Psychiatric:        Behavior: Behavior is cooperative.     ED Results / Procedures / Treatments   Labs (all labs ordered are listed, but only abnormal results are displayed) Labs Reviewed  COMPREHENSIVE METABOLIC PANEL - Abnormal; Notable for the following components:      Result Value   Glucose, Bld 206 (*)    Albumin 3.1 (*)    ALT 52 (*)    All other components within normal limits  CBC WITH DIFFERENTIAL/PLATELET - Abnormal; Notable for the following components:   Hemoglobin 11.7 (*)    Abs Immature Granulocytes 0.10 (*)    All other components within normal limits  CBG MONITORING, ED - Abnormal; Notable for the following components:   Glucose-Capillary 170 (*)    All other components within normal limits    EKG EKG Interpretation  Date/Time:  Tuesday March 31 2020 09:26:50 EDT Ventricular Rate:  83 PR Interval:    QRS Duration: 74 QT Interval:  413 QTC Calculation: 486 R Axis:   32 Text Interpretation: Sinus rhythm Low voltage, precordial leads Borderline T abnormalities, anterior leads Borderline prolonged QT interval Confirmed by Elnora Morrison [phone]) on 03/31/2020 9:45:17 AM   Radiology No results found.  Procedures Procedures   Medications Ordered in ED Medications - No data to display  ED Course  I have reviewed the triage vital signs and the nursing notes.  Pertinent labs & imaging results that were available during my care of the patient were reviewed by me and considered in my medical decision making (see chart for details).    MDM Rules/Calculators/A&P  Alert 70 year old female no acute distress, nontoxic appearing.  Patient is alert to person place time and event.  No focal neurological deficits noted.  Patient presents with chief complaint of hypoglycemia.  Was found this morning by her grandson to have an altered mental status.  Upon department arrival CBG 54.  She was given half a tube of oral glucose as well as 1 amp of D50 prior to arrival in the emergency department.  Upon arrival POC CBG was found to be 170.  Patient reports her illness change since being discharged from the hospital on 3/12.  Patient reports her medication has been increased and she has been started on sliding scale insulin as well.  Per chart review patient  NovoLovin was increased from 8 units twice daily to 15 units twice daily and patient was started on sliding scale novolog insulin.  She states that last night she took her Novolin as well as sliding scale NovoLog due to CBG being 611.  Patient reports that she then went to sleep.    Upon my assessment patient alert and oriented with no focal neurological deficit.  Patient has documented CBG of 54 in the field with fire and EMS.  Patient's episode of altered mental status likely secondary to hypoglycemia.  This is supported as patient's symptoms have resolved after receiving amp D50.  Will give patient a meal, assess CBC and CMP and monitor patient.  On serial reexamination patient remains alert, oriented in no acute distress, with no neurological deficits.  Patient able to eat and drink without difficulty.  CBC shows hemoglobin slightly decreased at 11.7; patient to be patient's baseline as she has ranged from 9.7 to 11.8 over the last 2 years.  4 days prior patient's hemoglobin was 10.7.    CMP shows glucose at 206.  Anion gap within normal limits, bicarb within normal limits.  We will have patient follow-up with her primary care provider in the next 2 to 3 days.  Patient advised to avoid additional sliding scale  insulin prior to sleeping.    Discussed results, findings, treatment and follow up. Patient advised of return precautions. Patient verbalized understanding and agreed with plan.  Patient was discussed with and evaluated by Dr. Reather Converse.    Final Clinical Impression(s) / ED Diagnoses Final diagnoses:  Hypoglycemia    Rx / DC Orders ED Discharge Orders    None       Loni Beckwith, PA-C 03/31/20 1149    Elnora Morrison, MD 03/31/20 [phone]

## 2020-03-31 NOTE — Discharge Instructions (Addendum)
You came to the emergency department today due to your low blood sugar.  You received glucose while on the ambulance.  Upon your arrival in the emergency department your sugar was found to be 170.  Your physical exam and lab work were reassuring.  Please follow-up with your primary care provider in the next 1 to 3 days for continued management of your diabetes.  Please avoid taking sliding scale insulin on top of your twice daily Novolin prior to sleeping.  If you have any concerns about your diabetes medication please call your primary care provider for clarification.  Get help right away if: You continue to have hypoglycemia symptoms after eating or drinking something that contains 15 grams of fast-acting carbohydrate and you cannot get your blood glucose above 70 mg/dL (3.9 mmol/L) while following the 15:15 rule. Your blood glucose is at or below 54 mg/dL (3 mmol/L). You have a seizure. You faint.

## 2020-03-31 NOTE — ED Triage Notes (Signed)
Pt to ED via EMS c/o hypoglycemia. Pt grandson called 911 this morning d/t pt was altered and not responding correctly. On fire arrival pt cbg was 54, given half a tube of instant glucose. On EMS arrival pt cbg 60, pt still not responding correctly to questions, given d50 given by EMS sugar increased to  256, last cbg 160. Pt alert and responding correctly as time passed with EMS. Pt reports her cbg last night was 600 , and she gave herself 15 units of sliding scale. Just discharged from the hospital 2 days ago for hyperglycemia .

## 2020-04-01 ENCOUNTER — Telehealth: Payer: Self-pay | Admitting: *Deleted

## 2020-04-01 NOTE — Telephone Encounter (Signed)
Transition Care Management Follow-up Telephone Call  Date of discharge and from where: 03/31/2020 [facility]  How have you been since you were released from the hospital? "I am feeling a little better"  Any questions or concerns? No  Items Reviewed:  Did the pt receive and understand the discharge instructions provided? Yes   Medications obtained and verified? Yes   Other? N/A  Any new allergies since your discharge? No   Dietary orders reviewed? Yes  Do you have support at home? Yes   Home Care and Equipment/Supplies: Were home health services ordered? not applicable If so, what is the name of the agency? N/A  Has the agency set up a time to come to the patient's home? not applicable Were any new equipment or medical supplies ordered?  No What is the name of the medical supply agency? N/A Were you able to get the supplies/equipment? not applicable Do you have any questions related to the use of the equipment or supplies? No  Functional Questionnaire: (I = Independent and D = Dependent) ADLs: I  Bathing/Dressing- I  Meal Prep- I  Eating- I  Maintaining continence- I  Transferring/Ambulation- I  Managing Meds- I  Follow up appointments reviewed:   PCP Hospital f/u appt confirmed? No  - I stressed the importance of her following closely with her PCP due to most recent ED and hospitalization. She verbalized understanding.  [facility] f/u appt confirmed? No    Are transportation arrangements needed? No   If their condition worsens, is the pt aware to call PCP or go to the Emergency Dept.? Yes  Was the patient provided with contact information for the PCP's office or ED? Yes  Was to pt encouraged to call back with questions or concerns? Yes

## 2020-04-02 ENCOUNTER — Ambulatory Visit (INDEPENDENT_AMBULATORY_CARE_PROVIDER_SITE_OTHER): Payer: Self-pay | Admitting: Internal Medicine

## 2020-04-02 ENCOUNTER — Encounter: Payer: Self-pay | Admitting: Internal Medicine

## 2020-04-02 VITALS — BP 136/77 | HR 85 | Temp 98.4°F | Ht 63.0 in | Wt 173.5 lb

## 2020-04-02 DIAGNOSIS — E1159 Type 2 diabetes mellitus with other circulatory complications: Secondary | ICD-10-CM

## 2020-04-02 DIAGNOSIS — E11649 Type 2 diabetes mellitus with hypoglycemia without coma: Secondary | ICD-10-CM

## 2020-04-02 DIAGNOSIS — I152 Hypertension secondary to endocrine disorders: Secondary | ICD-10-CM

## 2020-04-02 MED ORDER — INSULIN NPH (HUMAN) (ISOPHANE) 100 UNIT/ML ~~LOC~~ SUSP: 12.0000 [IU] | Freq: Two times a day (BID) | SUBCUTANEOUS | 11 refills | Status: DC

## 2020-04-06 NOTE — Assessment & Plan Note (Signed)
Blood pressure actually improved and at goal with CCB, ARB, and Thiazide.

## 2020-04-06 NOTE — Progress Notes (Signed)
  Subjective:  HPI: [name] is a 70 y.o. female who presents for follow up diabetes including 1 hospitalization for DKA followed by 1 ER visit for hypoglycemia.  Please see Assessment and Plan below for the status of her chronic medical problems.  Objective:  Physical Exam: Vitals:   04/02/20 0901 04/02/20 0940  BP: (!) 146/71 136/77  Pulse: 88 85  Temp: 98.4 F (36.9 C)   TempSrc: Oral   SpO2: 100%   Weight: 173 lb 8 oz (78.7 kg)   Height: 5\' 3"  (1.6 m)    Body mass index is 30.73 kg/m. Physical Exam Vitals and nursing note reviewed.  Constitutional:      Appearance: Normal appearance.  Cardiovascular:     Rate and Rhythm: Normal rate and regular rhythm.     Heart sounds: No murmur heard.   Pulmonary:     Effort: Pulmonary effort is normal.     Breath sounds: Normal breath sounds. No wheezing.  Abdominal:     General: Abdomen is flat.     Palpations: Abdomen is soft.     Tenderness: There is no abdominal tenderness.  Musculoskeletal:     Right lower leg: No edema.     Left lower leg: No edema.  Neurological:     Mental Status: She is alert.  Psychiatric:        Mood and Affect: Mood normal.    Assessment & Plan:  See Encounters Tab for problem based charting.  Medications Ordered Meds ordered this encounter  Medications  . insulin NPH Human (NOVOLIN N) 100 UNIT/ML injection    Sig: Inject 0.12 mLs (12 Units total) into the skin 2 (two) times daily at 8 am and 10 pm.    Dispense:  10 mL    Refill:  11   Other Orders No orders of the defined types were placed in this encounter.  Follow Up: Return in about 4 weeks (around 04/30/2020).

## 2020-04-06 NOTE — Assessment & Plan Note (Signed)
HPI: Hospitalization for hyperglycemia/DKA.  Was taking either Novolin N or Novolin 70/30 prior to hospitalization.  She as discharged on 15 units of Novolin N + SSI with Novolog, she did not understand these instructions and reversed the insturctions.  She then represented for hypoglycemia.  She then started taking 15 units of Novolin N BID and Novolog for SSI.  Since then feeling better but still having food cravings and mild hypoglycemia around 3 am- keeping snacks at bedside.  Glucometer unable to be downloaded but brief review shows CBG as high as 400 and low as 90.  A Uncontrolled type 2 DM with hyper and hypoglycemia  P: Provided Accu chek 360 tool to track sugars over 3 days. Decrease Novolin N to 12 units BID for now to reduce Hypoglycemia. COntinue to use Novolog for SSI, will have her call me after completing Accu Check and we can adjust this better.

## 2020-04-21 ENCOUNTER — Other Ambulatory Visit: Payer: Self-pay | Admitting: Internal Medicine

## 2020-04-21 ENCOUNTER — Other Ambulatory Visit (HOSPITAL_COMMUNITY): Payer: Self-pay

## 2020-04-21 MED ORDER — ROSUVASTATIN CALCIUM 20 MG PO TABS
20.0000 mg | ORAL_TABLET | Freq: Every day | ORAL | 11 refills | Status: DC
  Filled 2020-04-21: qty 30, 30d supply, fill #0
  Filled 2020-06-01: qty 30, 30d supply, fill #1
  Filled 2020-07-09: qty 30, 30d supply, fill #2

## 2020-05-28 ENCOUNTER — Ambulatory Visit: Payer: Self-pay | Admitting: Dietician

## 2020-06-01 ENCOUNTER — Other Ambulatory Visit (HOSPITAL_COMMUNITY): Payer: Self-pay

## 2020-07-09 ENCOUNTER — Other Ambulatory Visit (HOSPITAL_COMMUNITY): Payer: Self-pay

## 2020-07-27 ENCOUNTER — Inpatient Hospital Stay (HOSPITAL_COMMUNITY)
Admission: EM | Admit: 2020-07-27 | Discharge: 2020-07-30 | DRG: 637 | Disposition: A | Discharge status: Home / Self Care | Payer: Medicare Other | Attending: Internal Medicine | Admitting: Internal Medicine

## 2020-07-27 ENCOUNTER — Encounter (HOSPITAL_COMMUNITY): Payer: Self-pay

## 2020-07-27 ENCOUNTER — Other Ambulatory Visit: Payer: Self-pay

## 2020-07-27 ENCOUNTER — Emergency Department (HOSPITAL_COMMUNITY): Payer: Medicare Other

## 2020-07-27 DIAGNOSIS — E1111 Type 2 diabetes mellitus with ketoacidosis with coma: Principal | ICD-10-CM

## 2020-07-27 DIAGNOSIS — N179 Acute kidney failure, unspecified: Secondary | ICD-10-CM

## 2020-07-27 DIAGNOSIS — D72829 Elevated white blood cell count, unspecified: Secondary | ICD-10-CM | POA: Diagnosis present

## 2020-07-27 DIAGNOSIS — E875 Hyperkalemia: Secondary | ICD-10-CM | POA: Diagnosis present

## 2020-07-27 DIAGNOSIS — G9341 Metabolic encephalopathy: Secondary | ICD-10-CM | POA: Insufficient documentation

## 2020-07-27 DIAGNOSIS — Z79899 Other long term (current) drug therapy: Secondary | ICD-10-CM | POA: Diagnosis not present

## 2020-07-27 DIAGNOSIS — E869 Volume depletion, unspecified: Secondary | ICD-10-CM | POA: Diagnosis present

## 2020-07-27 DIAGNOSIS — E785 Hyperlipidemia, unspecified: Secondary | ICD-10-CM | POA: Diagnosis present

## 2020-07-27 DIAGNOSIS — E1122 Type 2 diabetes mellitus with diabetic chronic kidney disease: Secondary | ICD-10-CM | POA: Diagnosis present

## 2020-07-27 DIAGNOSIS — Z881 Allergy status to other antibiotic agents status: Secondary | ICD-10-CM

## 2020-07-27 DIAGNOSIS — E1110 Type 2 diabetes mellitus with ketoacidosis without coma: Secondary | ICD-10-CM | POA: Diagnosis present

## 2020-07-27 DIAGNOSIS — Z8249 Family history of ischemic heart disease and other diseases of the circulatory system: Secondary | ICD-10-CM

## 2020-07-27 DIAGNOSIS — E871 Hypo-osmolality and hyponatremia: Secondary | ICD-10-CM | POA: Diagnosis present

## 2020-07-27 DIAGNOSIS — Z91013 Allergy to seafood: Secondary | ICD-10-CM

## 2020-07-27 DIAGNOSIS — Z20822 Contact with and (suspected) exposure to covid-19: Secondary | ICD-10-CM | POA: Diagnosis present

## 2020-07-27 DIAGNOSIS — N1831 Chronic kidney disease, stage 3a: Secondary | ICD-10-CM | POA: Diagnosis present

## 2020-07-27 DIAGNOSIS — Z91012 Allergy to eggs: Secondary | ICD-10-CM | POA: Diagnosis not present

## 2020-07-27 DIAGNOSIS — Z794 Long term (current) use of insulin: Secondary | ICD-10-CM | POA: Diagnosis not present

## 2020-07-27 DIAGNOSIS — Z833 Family history of diabetes mellitus: Secondary | ICD-10-CM | POA: Diagnosis not present

## 2020-07-27 DIAGNOSIS — Z823 Family history of stroke: Secondary | ICD-10-CM | POA: Diagnosis not present

## 2020-07-27 DIAGNOSIS — I129 Hypertensive chronic kidney disease with stage 1 through stage 4 chronic kidney disease, or unspecified chronic kidney disease: Secondary | ICD-10-CM | POA: Diagnosis present

## 2020-07-27 DIAGNOSIS — Z888 Allergy status to other drugs, medicaments and biological substances status: Secondary | ICD-10-CM | POA: Diagnosis not present

## 2020-07-27 LAB — RESP PANEL BY RT-PCR (FLU A&B, COVID) ARPGX2
Influenza A by PCR: NEGATIVE
Influenza B by PCR: NEGATIVE
SARS Coronavirus 2 by RT PCR: NEGATIVE

## 2020-07-27 LAB — BASIC METABOLIC PANEL
Anion gap: 26 — ABNORMAL HIGH (ref 5–15)
Anion gap: 30 — ABNORMAL HIGH (ref 5–15)
Anion gap: 17 — ABNORMAL HIGH (ref 5–15)
BUN: 68 mg/dL — ABNORMAL HIGH (ref 8–23)
BUN: 48 mg/dL — ABNORMAL HIGH (ref 8–23)
BUN: 65 mg/dL — ABNORMAL HIGH (ref 8–23)
BUN: 59 mg/dL — ABNORMAL HIGH (ref 8–23)
CO2: <7 mmol/L — ABNORMAL LOW (ref 22–32)
CO2: 32 mmol/L (ref 22–32)
CO2: 11 mmol/L — ABNORMAL LOW (ref 22–32)
CO2: 23 mmol/L (ref 22–32)
Calcium: 8.8 mg/dL — ABNORMAL LOW (ref 8.9–10.3)
Calcium: 5.2 mg/dL — CL (ref 8.9–10.3)
Calcium: 8.4 mg/dL — ABNORMAL LOW (ref 8.9–10.3)
Calcium: 8.5 mg/dL — ABNORMAL LOW (ref 8.9–10.3)
Chloride: 94 mmol/L — ABNORMAL LOW (ref 98–111)
Chloride: 71 mmol/L — ABNORMAL LOW (ref 98–111)
Chloride: 98 mmol/L (ref 98–111)
Chloride: 100 mmol/L (ref 98–111)
Creatinine, Ser: 2.65 mg/dL — ABNORMAL HIGH (ref 0.44–1.00)
Creatinine, Ser: 1.7 mg/dL — ABNORMAL HIGH (ref 0.44–1.00)
Creatinine, Ser: 2.52 mg/dL — ABNORMAL HIGH (ref 0.44–1.00)
Creatinine, Ser: 2.18 mg/dL — ABNORMAL HIGH (ref 0.44–1.00)
GFR, Estimated: 19 mL/min — ABNORMAL LOW (ref >60)
GFR, Estimated: 32 mL/min — ABNORMAL LOW (ref >60)
GFR, Estimated: 20 mL/min — ABNORMAL LOW (ref >60)
GFR, Estimated: 24 mL/min — ABNORMAL LOW (ref >60)
Glucose, Bld: 878 mg/dL (ref 70–99)
Glucose, Bld: 501 mg/dL (ref 70–99)
Glucose, Bld: 478 mg/dL — ABNORMAL HIGH (ref 70–99)
Glucose, Bld: 270 mg/dL — ABNORMAL HIGH (ref 70–99)
Potassium: 6.8 mmol/L (ref 3.5–5.1)
Potassium: 4.2 mmol/L (ref 3.5–5.1)
Potassium: 4.6 mmol/L (ref 3.5–5.1)
Potassium: 3.9 mmol/L (ref 3.5–5.1)
Sodium: 133 mmol/L — ABNORMAL LOW (ref 135–145)
Sodium: 129 mmol/L — ABNORMAL LOW (ref 135–145)
Sodium: 139 mmol/L (ref 135–145)
Sodium: 140 mmol/L (ref 135–145)

## 2020-07-27 LAB — I-STAT CHEM 8, ED
BUN: 69 mg/dL — ABNORMAL HIGH (ref 8–23)
Calcium, Ion: 1.09 mmol/L — ABNORMAL LOW (ref 1.15–1.40)
Chloride: 103 mmol/L (ref 98–111)
Creatinine, Ser: 2.2 mg/dL — ABNORMAL HIGH (ref 0.44–1.00)
Glucose, Bld: >700 mg/dL (ref 70–99)
HCT: 42 % (ref 36.0–46.0)
Hemoglobin: 14.3 g/dL (ref 12.0–15.0)
Potassium: 6.7 mmol/L (ref 3.5–5.1)
Sodium: 129 mmol/L — ABNORMAL LOW (ref 135–145)
TCO2: 6 mmol/L — ABNORMAL LOW (ref 22–32)

## 2020-07-27 LAB — CBC WITH DIFFERENTIAL/PLATELET
Abs Immature Granulocytes: 1.83 10*3/uL — ABNORMAL HIGH (ref 0.00–0.07)
Basophils Absolute: 0.1 10*3/uL (ref 0.0–0.1)
Basophils Relative: 1 %
Eosinophils Absolute: 0 10*3/uL (ref 0.0–0.5)
Eosinophils Relative: 0 %
HCT: 42.1 % (ref 36.0–46.0)
Hemoglobin: 12.5 g/dL (ref 12.0–15.0)
Immature Granulocytes: 8 %
Lymphocytes Relative: 10 %
Lymphs Abs: 2.2 10*3/uL (ref 0.7–4.0)
MCH: 28.9 pg (ref 26.0–34.0)
MCHC: 29.7 g/dL — ABNORMAL LOW (ref 30.0–36.0)
MCV: 97.2 fL (ref 80.0–100.0)
Monocytes Absolute: 0.9 10*3/uL (ref 0.1–1.0)
Monocytes Relative: 4 %
Neutro Abs: 18.6 10*3/uL — ABNORMAL HIGH (ref 1.7–7.7)
Neutrophils Relative %: 77 %
Platelets: 281 10*3/uL (ref 150–400)
RBC: 4.33 MIL/uL (ref 3.87–5.11)
RDW: 14 % (ref 11.5–15.5)
WBC: 23.7 10*3/uL — ABNORMAL HIGH (ref 4.0–10.5)
nRBC: 0 % (ref 0.0–0.2)

## 2020-07-27 LAB — GLUCOSE, CAPILLARY
Glucose-Capillary: >600 mg/dL (ref 70–99)
Glucose-Capillary: >600 mg/dL (ref 70–99)
Glucose-Capillary: >600 mg/dL (ref 70–99)
Glucose-Capillary: >600 mg/dL (ref 70–99)
Glucose-Capillary: 539 mg/dL (ref 70–99)
Glucose-Capillary: 555 mg/dL (ref 70–99)
Glucose-Capillary: 559 mg/dL (ref 70–99)
Glucose-Capillary: 479 mg/dL — ABNORMAL HIGH (ref 70–99)
Glucose-Capillary: 550 mg/dL (ref 70–99)
Glucose-Capillary: 457 mg/dL — ABNORMAL HIGH (ref 70–99)
Glucose-Capillary: 494 mg/dL — ABNORMAL HIGH (ref 70–99)
Glucose-Capillary: 427 mg/dL — ABNORMAL HIGH (ref 70–99)
Glucose-Capillary: 375 mg/dL — ABNORMAL HIGH (ref 70–99)
Glucose-Capillary: 289 mg/dL — ABNORMAL HIGH (ref 70–99)
Glucose-Capillary: 282 mg/dL — ABNORMAL HIGH (ref 70–99)
Glucose-Capillary: 218 mg/dL — ABNORMAL HIGH (ref 70–99)

## 2020-07-27 LAB — MAGNESIUM
Magnesium: 2 mg/dL (ref 1.7–2.4)
Magnesium: 2.6 mg/dL — ABNORMAL HIGH (ref 1.7–2.4)

## 2020-07-27 LAB — CBC
HCT: 42.9 % (ref 36.0–46.0)
Hemoglobin: 12.4 g/dL (ref 12.0–15.0)
MCH: 29 pg (ref 26.0–34.0)
MCHC: 28.9 g/dL — ABNORMAL LOW (ref 30.0–36.0)
MCV: 100.2 fL — ABNORMAL HIGH (ref 80.0–100.0)
Platelets: 246 10*3/uL (ref 150–400)
RBC: 4.28 MIL/uL (ref 3.87–5.11)
RDW: 13.9 % (ref 11.5–15.5)
WBC: 28.9 10*3/uL — ABNORMAL HIGH (ref 4.0–10.5)
nRBC: 0 % (ref 0.0–0.2)

## 2020-07-27 LAB — BLOOD GAS, VENOUS
Bicarbonate: 2.7 mmol/L — ABNORMAL LOW (ref 20.0–28.0)
O2 Saturation: 84.9 %
Patient temperature: 98.6
pCO2, Ven: <19 mmHg — CL (ref 44.0–60.0)
pH, Ven: 6.878 — CL (ref 7.250–7.430)
pO2, Ven: 75.3 mmHg — ABNORMAL HIGH (ref 32.0–45.0)

## 2020-07-27 LAB — CBG MONITORING, ED
Glucose-Capillary: >600 mg/dL (ref 70–99)
Glucose-Capillary: >600 mg/dL (ref 70–99)
Glucose-Capillary: >600 mg/dL (ref 70–99)
Glucose-Capillary: >600 mg/dL (ref 70–99)

## 2020-07-27 LAB — BETA-HYDROXYBUTYRIC ACID
Beta-Hydroxybutyric Acid: 15.11 mmol/L — ABNORMAL HIGH (ref 0.05–0.27)
Beta-Hydroxybutyric Acid: >8 mmol/L — ABNORMAL HIGH (ref 0.05–0.27)
Beta-Hydroxybutyric Acid: >8 mmol/L — ABNORMAL HIGH (ref 0.05–0.27)

## 2020-07-27 LAB — HIV ANTIBODY (ROUTINE TESTING W REFLEX): HIV Screen 4th Generation wRfx: NONREACTIVE

## 2020-07-27 LAB — HEMOGLOBIN A1C
Hgb A1c MFr Bld: 12.9 % — ABNORMAL HIGH (ref 4.8–5.6)
Mean Plasma Glucose: 323.53 mg/dL

## 2020-07-27 LAB — MRSA NEXT GEN BY PCR, NASAL: MRSA by PCR Next Gen: NOT DETECTED

## 2020-07-27 MED ORDER — INSULIN REGULAR(HUMAN) IN NACL 100-0.9 UT/100ML-% IV SOLN: INTRAVENOUS | Status: DC

## 2020-07-27 MED ORDER — CALCIUM GLUCONATE-NACL 1-0.675 GM/50ML-% IV SOLN
1.0000 g | Freq: Once | INTRAVENOUS | Status: AC
  Administered 2020-07-27: 1000 mg via INTRAVENOUS
  Filled 2020-07-27: qty 50

## 2020-07-27 MED ORDER — SODIUM CHLORIDE 0.9 % IV SOLN
INTRAVENOUS | Status: DC | PRN
  Administered 2020-07-27: 500 mL via INTRAVENOUS

## 2020-07-27 MED ORDER — CHLORHEXIDINE GLUCONATE CLOTH 2 % EX PADS
6.0000 | MEDICATED_PAD | Freq: Every day | CUTANEOUS | Status: DC
  Administered 2020-07-27: 6 via TOPICAL

## 2020-07-27 MED ORDER — HEPARIN SODIUM (PORCINE) 5000 UNIT/ML IJ SOLN
5000.0000 [IU] | Freq: Three times a day (TID) | INTRAMUSCULAR | Status: DC
  Administered 2020-07-27 – 2020-07-30 (×10): 5000 [IU] via SUBCUTANEOUS
  Filled 2020-07-27 (×10): qty 1

## 2020-07-27 MED ORDER — SODIUM CHLORIDE 0.9 % IV SOLN
2.0000 g | Freq: Once | INTRAVENOUS | Status: AC
  Administered 2020-07-27: 2 g via INTRAVENOUS
  Filled 2020-07-27: qty 20

## 2020-07-27 MED ORDER — INSULIN REGULAR(HUMAN) IN NACL 100-0.9 UT/100ML-% IV SOLN
INTRAVENOUS | Status: DC
Start: 2020-07-27 — End: 2020-07-27
  Administered 2020-07-27: 10 [IU]/h via INTRAVENOUS
  Filled 2020-07-27: qty 100

## 2020-07-27 MED ORDER — INSULIN REGULAR(HUMAN) IN NACL 100-0.9 UT/100ML-% IV SOLN
INTRAVENOUS | Status: DC
  Administered 2020-07-27: 10 [IU]/h via INTRAVENOUS
  Administered 2020-07-27: 14 [IU]/h via INTRAVENOUS
  Administered 2020-07-28: 11 [IU]/h via INTRAVENOUS
  Filled 2020-07-27 (×2): qty 100

## 2020-07-27 MED ORDER — STERILE WATER FOR INJECTION IV SOLN
INTRAVENOUS | Status: DC
  Filled 2020-07-27 (×2): qty 1000
  Filled 2020-07-27 (×2): qty 150

## 2020-07-27 MED ORDER — ORAL CARE MOUTH RINSE
15.0000 mL | Freq: Two times a day (BID) | OROMUCOSAL | Status: DC
  Administered 2020-07-27 – 2020-07-30 (×4): 15 mL via OROMUCOSAL

## 2020-07-27 MED ORDER — LACTATED RINGERS IV SOLN: INTRAVENOUS | Status: DC

## 2020-07-27 MED ORDER — DEXTROSE 50 % IV SOLN: 0.0000 mL | INTRAVENOUS | Status: DC | PRN

## 2020-07-27 MED ORDER — LACTATED RINGERS IV BOLUS
1000.0000 mL | Freq: Once | INTRAVENOUS | Status: AC
  Administered 2020-07-27: 1000 mL via INTRAVENOUS

## 2020-07-27 MED ORDER — LACTATED RINGERS IV BOLUS
2000.0000 mL | Freq: Once | INTRAVENOUS | Status: AC
  Administered 2020-07-27: 2000 mL via INTRAVENOUS

## 2020-07-27 MED ORDER — DEXTROSE IN LACTATED RINGERS 5 % IV SOLN: INTRAVENOUS | Status: DC

## 2020-07-27 NOTE — H&P (Signed)
NAME:  Mandy Mullen, MRN:  [id number], DOB:  [birth year], LOS: 0 ADMISSION DATE:  07/27/2020, CONSULTATION DATE:  07/27/20 REFERRING MD:  Tyrone Nine - EM, CHIEF COMPLAINT:  Altered mental status   History of Present Illness:  70 yo F PMH poorly controlled IDDM type 2, HTN, CKD IIIa, HLD who presented to ED 7/11 with AMS.  Per ED chart review, pt reportedly started novolog 2 days prior to presentation. Degree of encephalopathy unfortunately prevents thorough history. Patient denies n/v/d, sick contacts, chest pain, SOB. Denies missed insulin doses. In discussion with pts emergency contact / niece, pt was feeling lethargic x 1 week, but no sx of URI, GI illness but emergency contact is unable to provide further history or details.   In ED, workup reveals Glu 878 beta hydroxybutyric acid 15.11 bicarb <7, pH 6.8, WBC 23, Na 133 K 6.8 Cr 2.65. She was started on IVF but due to PIV difficulty had only received approx 254ml. Started on insulin gtt, bicarb.    PCCM consulted for admission   Pertinent  Medical History  IDDM HTN HLD CKD IIIA  Significant Hospital Events: Including procedures, antibiotic start and stop dates in addition to other pertinent events   7/11 presents to ED for AMS. PCCM admitting to ICU with DKA   Interim History / Subjective:  Now has started receiving 2L LR after PIV difficulties have resolved On insulin gtt   Reportedly more awake now than initial presentation   Objective   Blood pressure (!) 138/59, pulse (!) 101, temperature (!) 92.2 F (33.4 C), temperature source Rectal, resp. rate 17, height 5\' 3"  (1.6 m), weight 78 kg, SpO2 100 %.       No intake or output data in the 24 hours ending 07/27/20 1154 Filed Weights   07/27/20 1006  Weight: 78 kg    Examination: General: Ill appearing older adult F, reclined in bed HENT: NCAT pink dry mm. Anicteric sclera Lungs: incr RR, unlabored. CTAb  Cardiovascular: tachycardic rate reg rhythm s1s2 no rgm  Abdomen:  soft round ndnt + bowel sounds  Extremities: No acute joint deformity no cyanosis or clubbing  Neuro: awake, lethargic. Garbled speech, attempting to converse GU: defer   Resolved Hospital Problem list     Assessment & Plan:   Acute metabolic encephalopathy -DKA, AKI.  -mild hyponatremia, doubt contributing  P -supportive care for DKA tx as below -minimize CNS depressing meds   DKA Poorly controlled IDDM Severe metabolic acidosis  P -starting 2L IVF bolus, follow with mIVF -q4 BMP, q8 Beta hydroxybutyric acid, mag  -insulin gtt per endotool  -bicarb gtt  -will ask DM educator to see when appropriate   AKI on CKD IIIa -Cr 3/22 was 0.98. is 2.65 7/11  P -trend renal indices, UOP -IVF as above   Hyperkalemia Hypocalcemia Hyponatremia  P -ecg PRN -give Ca -getting bicarb gtt, insulin gtt  -trending metabolic panel as above   Leukocytosis -reactive v infectious.  -got abx in ED for possible sepsis. CXR without evidence of PNA  P -will repeat CBC 7/11 evening -will get a UA, Bcx  -defer further abx and re-eval as needed  -doubt much utility in PCT right now but could consider as metabolic abnormalities normalize   Hx HTN Hx HLD P -holding home meds   Best Practice (right click and "Reselect all SmartList Selections" daily)   Diet/type: NPO DVT prophylaxis: prophylactic heparin  GI prophylaxis: N/A Lines: N/A Foley:  N/A Code Status:  full code  Last date of multidisciplinary goals of care discussion [pending] Dispo: admit to ICU  Labs   CBC: Recent Labs  Lab 07/27/20 1018 07/27/20 1029  WBC 23.7*  --   NEUTROABS 18.6*  --   HGB 12.5 14.3  HCT 42.1 42.0  MCV 97.2  --   PLT 281  --     Basic Metabolic Panel: Recent Labs  Lab 07/27/20 1018 07/27/20 1029  NA 133* 129*  K 6.8* 6.7*  CL 94* 103  CO2 <7*  --   GLUCOSE 878* >700*  BUN 68* 69*  CREATININE 2.65* 2.20*  CALCIUM 8.8*  --    GFR: Estimated Creatinine Clearance: 24.2 mL/min  (A) (by C-G formula based on SCr of 2.2 mg/dL (H)). Recent Labs  Lab 07/27/20 1018  WBC 23.7*    Liver Function Tests: No results for input(s): AST, ALT, ALKPHOS, BILITOT, PROT, ALBUMIN in the last 168 hours. No results for input(s): LIPASE, AMYLASE in the last 168 hours. No results for input(s): AMMONIA in the last 168 hours.  ABG    Component Value Date/Time   HCO3 2.7 (L) 07/27/2020 1018   TCO2 6 (L) 07/27/2020 1029   ACIDBASEDEF 23.3 (H) 03/24/2020 0832   O2SAT 84.9 07/27/2020 1018     Coagulation Profile: No results for input(s): INR, PROTIME in the last 168 hours.  Cardiac Enzymes: No results for input(s): CKTOTAL, CKMB, CKMBINDEX, TROPONINI in the last 168 hours.  HbA1C: Hemoglobin A1C  Date/Time Value Ref Range Status  03/12/2020 10:07 AM 13.6 (A) 4.0 - 5.6 % Final  12/27/2018 10:49 AM 13.0 (A) 4.0 - 5.6 % Final   HbA1c POC (<> result, manual entry)  Date/Time Value Ref Range Status  07/25/2019 10:26 AM >14.0 (A) 4.0 - 5.6 % Final   Hgb A1c MFr Bld  Date/Time Value Ref Range Status  12/21/2009 01:47 PM 9.9 %   06/24/2009 02:59 PM 10.7 %     CBG: Recent Labs  Lab 07/27/20 1047 07/27/20 1113 07/27/20 1149  GLUCAP >600* >600* >600*    Review of Systems:   Unable to obtain due to encephalopathy   Past Medical History:  She,  has a past medical history of History of blood transfusion (1991), History of herpes genitalis, Hyperlipidemia, Hypertension, MRSA (methicillin resistant Staphylococcus aureus), and Type II diabetes mellitus ([place]).   Surgical History:   Past Surgical History:  Procedure Laterality Date   ABDOMINAL HYSTERECTOMY  1991   "w/left ovary"   INCISION AND DRAINAGE ABSCESS ANAL  04/2004   LEFT OOPHORECTOMY Left 1991     Social History:   reports that she has never smoked. She has never used smokeless tobacco. She reports previous alcohol use. She reports that she does not use drugs.   Family History:  Her family history includes  Coronary artery disease in her father; Diabetes in her sister; Stroke in her sister.   Allergies Allergies  Allergen Reactions   Lisinopril Itching, Swelling and Rash    Angioedema and diffuse body rash   Bactrim [Sulfamethoxazole-Trimethoprim]     Erythroderma like reaction over 80% of her body    Eggs Or Egg-Derived Products     REACTION: Nausea. Chills/fevers x 6 months (after a flu shot in the 1990s).   Shellfish Allergy    Metformin And Related Rash     Home Medications  Prior to Admission medications   Medication Sig Start Date End Date Taking? Authorizing Provider  amLODipine (NORVASC) 10 MG tablet TAKE 1 TABLET (10 MG  TOTAL) BY MOUTH DAILY. IM PROGRAM 03/12/20 03/12/21  Lucious Groves, DO  glucose blood (CONTOUR NEXT TEST) test strip Test blood sugar 3 times daily. IM program 06/26/17   Ledell Noss, MD  insulin aspart (NOVOLOG) 100 UNIT/ML FlexPen CBG 70 - 120: 0 units CBG 121 - 150: 1 unit CBG 151 - 200: 2 units CBG 201 - 250: 3 units CBG 251 - 300: 5 units CBG 301 - 350: 7 units CBG 351 - 400: 9 units 03/28/20   Hosie Poisson, MD  insulin NPH Human (NOVOLIN N) 100 UNIT/ML injection Inject 0.12 mLs (12 Units total) into the skin 2 (two) times daily at 8 am and 10 pm. 04/02/20   Lucious Groves, DO  Insulin Pen Needle (PEN NEEDLES 29GX1/2") 29G X 12MM MISC To use for novolog injection TIDAC. 03/28/20   Hosie Poisson, MD  Insulin Syringe-Needle U-100 (INSULIN SYRINGE .3CC/31GX5/16") 31G X 5/16" 0.3 ML MISC 1 Dose by Does not apply route 2 (two) times daily. IM program 06/21/17   Ledell Noss, MD  losartan-hydrochlorothiazide (HYZAAR) 100-25 MG tablet TAKE 1 TABLET BY MOUTH DAILY. 03/12/20 03/12/21  Lucious Groves, DO  MICROLET LANCETS MISC 1 Device by Does not apply route 3 (three) times daily. 06/26/17   Ledell Noss, MD  ONE TOUCH LANCETS MISC Use to check blood sugars 08/14/12   Wilber Oliphant, MD  rosuvastatin (CRESTOR) 20 MG tablet Take 1 tablet (20 mg total) by mouth daily. 04/21/20  04/21/21  Lucious Groves, DO  senna-docusate (SENOKOT-S) 8.6-50 MG tablet Take 2 tablets by mouth at bedtime as needed for mild constipation. 03/28/20   Hosie Poisson, MD     Critical care time: 71     CRITICAL CARE Performed by: Cristal Generous   Total critical care time: 48 minutes  Critical care time was exclusive of separately billable procedures and treating other patients. Critical care was necessary to treat or prevent imminent or life-threatening deterioration.  Critical care was time spent personally by me on the following activities: development of treatment plan with patient and/or surrogate as well as nursing, discussions with consultants, evaluation of patient's response to treatment, examination of patient, obtaining history from patient or surrogate, ordering and performing treatments and interventions, ordering and review of laboratory studies, ordering and review of radiographic studies, pulse oximetry and re-evaluation of patient's condition.  Eliseo Gum MSN, AGACNP-BC [place] for pager  07/27/2020, 12:33 PM

## 2020-07-27 NOTE — Plan of Care (Signed)
  Problem: Education: Goal: Ability to describe self-care measures that may prevent or decrease complications (Diabetes Survival Skills Education) will improve Outcome: Not Progressing Goal: Individualized Educational Video(s) Outcome: Not Progressing   Problem: Cardiac: Goal: Ability to maintain an adequate cardiac output will improve Outcome: Not Progressing   Problem: Health Behavior/Discharge Planning: Goal: Ability to identify and utilize available resources and services will improve Outcome: Not Progressing Goal: Ability to manage health-related needs will improve Outcome: Not Progressing   Problem: Fluid Volume: Goal: Ability to achieve a balanced intake and output will improve Outcome: Not Progressing   Problem: Metabolic: Goal: Ability to maintain appropriate glucose levels will improve Outcome: Not Progressing   Problem: Nutritional: Goal: Maintenance of adequate nutrition will improve Outcome: Not Progressing Goal: Maintenance of adequate weight for body size and type will improve Outcome: Not Progressing   Problem: Respiratory: Goal: Will regain and/or maintain adequate ventilation Outcome: Not Progressing   Problem: Urinary Elimination: Goal: Ability to achieve and maintain adequate renal perfusion and functioning will improve Outcome: Not Progressing

## 2020-07-27 NOTE — ED Notes (Signed)
MD aware of rectal temp. Applying bare hugger at this time

## 2020-07-27 NOTE — ED Notes (Signed)
[place] 6.878 and CO2 under 19, Deno Etienne called and given critical results over phone.

## 2020-07-27 NOTE — ED Provider Notes (Signed)
[facility] DEPT Provider Note   CSN: [id number] Arrival date & time: 07/27/20  7902     History Chief Complaint  Patient presents with   Hyperglycemia    Mandy Mullen is a 70 y.o. female.  70 yo F with a  cc of high blood sugar.  Patient sleepy and unable to provide much further history level V caveat.   The history is provided by the patient.  Hyperglycemia Illness Severity:  Moderate Onset quality:  Gradual Duration:  2 days Timing:  Constant Progression:  Worsening Chronicity:  Recurrent     Past Medical History:  Diagnosis Date   History of blood transfusion 1991   "related to hysterectomy/left ovary OR" (11/28/2017)   History of herpes genitalis    Hyperlipidemia    Hypertension    MRSA (methicillin resistant Staphylococcus aureus)    Reccurent MRSA abscesses   Type II diabetes mellitus [facility])     Patient Active Problem List   Diagnosis Date Noted   DKA (diabetic ketoacidosis) ([place]) 07/27/2020   Diabetic keto-acidosis ([place]) 03/24/2020   Normocytic anemia 04/24/2019   Lipoma of head 12/28/2018   CKD stage 3 due to type 2 diabetes mellitus ([place]) 07/29/2016   Healthcare maintenance 02/26/2014   Hypertension associated with diabetes ([place]) 12/17/2007   Hyperlipidemia 01/13/2006   Uncontrolled type 2 diabetes mellitus ([place]) 12/20/2005   TOTAL ABDOMINAL HYSTERECTOMY, HX OF 12/20/2005    Past Surgical History:  Procedure Laterality Date   ABDOMINAL HYSTERECTOMY  1991   "w/left ovary"   INCISION AND DRAINAGE ABSCESS ANAL  04/2004   LEFT OOPHORECTOMY Left 1991     OB History   No obstetric history on file.     Family History  Problem Relation Age of Onset   Coronary artery disease Father    Diabetes Sister    Stroke Sister     Social History   Tobacco Use   Smoking status: Never   Smokeless tobacco: Never  Vaping Use   Vaping Use: Never used  Substance Use Topics   Alcohol use: Not Currently    Comment:  11/28/2017 "last drink was in 2006; never had problem w/alcohol".   Drug use: Never    Home Medications Prior to Admission medications   Medication Sig Start Date End Date Taking? Authorizing Provider  amLODipine (NORVASC) 10 MG tablet TAKE 1 TABLET (10 MG TOTAL) BY MOUTH DAILY. IM PROGRAM 03/12/20 03/12/21  Lucious Groves, DO  glucose blood (CONTOUR NEXT TEST) test strip Test blood sugar 3 times daily. IM program 06/26/17   Ledell Noss, MD  insulin aspart (NOVOLOG) 100 UNIT/ML FlexPen CBG 70 - 120: 0 units CBG 121 - 150: 1 unit CBG 151 - 200: 2 units CBG 201 - 250: 3 units CBG 251 - 300: 5 units CBG 301 - 350: 7 units CBG 351 - 400: 9 units 03/28/20   Hosie Poisson, MD  insulin NPH Human (NOVOLIN N) 100 UNIT/ML injection Inject 0.12 mLs (12 Units total) into the skin 2 (two) times daily at 8 am and 10 pm. 04/02/20   Lucious Groves, DO  Insulin Pen Needle (PEN NEEDLES 29GX1/2") 29G X 12MM MISC To use for novolog injection TIDAC. 03/28/20   Hosie Poisson, MD  Insulin Syringe-Needle U-100 (INSULIN SYRINGE .3CC/31GX5/16") 31G X 5/16" 0.3 ML MISC 1 Dose by Does not apply route 2 (two) times daily. IM program 06/21/17   Ledell Noss, MD  losartan-hydrochlorothiazide (HYZAAR) 100-25 MG tablet TAKE 1 TABLET BY  MOUTH DAILY. 03/12/20 03/12/21  Lucious Groves, DO  MICROLET LANCETS MISC 1 Device by Does not apply route 3 (three) times daily. 06/26/17   Ledell Noss, MD  ONE TOUCH LANCETS MISC Use to check blood sugars 08/14/12   Wilber Oliphant, MD  rosuvastatin (CRESTOR) 20 MG tablet Take 1 tablet (20 mg total) by mouth daily. 04/21/20 04/21/21  Lucious Groves, DO  senna-docusate (SENOKOT-S) 8.6-50 MG tablet Take 2 tablets by mouth at bedtime as needed for mild constipation. 03/28/20   Hosie Poisson, MD    Allergies    Lisinopril, Bactrim [sulfamethoxazole-trimethoprim], Eggs or egg-derived products, Shellfish allergy, and Metformin and related  Review of Systems   Review of Systems  Unable to perform ROS:  Mental status change   Physical Exam Updated Vital Signs BP (!) 138/59   Pulse (!) 101   Temp (!) 92.2 F (33.4 C) (Rectal)   Resp 17   Ht 5\' 3"  (1.6 m)   Wt 78 kg   SpO2 100%   BMI 30.46 kg/m   Physical Exam Vitals and nursing note reviewed.  Constitutional:      General: She is not in acute distress.    Appearance: She is well-developed. She is not diaphoretic.  HENT:     Head: Normocephalic and atraumatic.  Eyes:     Pupils: Pupils are equal, round, and reactive to light.  Cardiovascular:     Rate and Rhythm: Normal rate and regular rhythm.     Heart sounds: No murmur heard.   No friction rub. No gallop.  Pulmonary:     Effort: Pulmonary effort is normal.     Breath sounds: No wheezing or rales.  Abdominal:     General: There is no distension.     Palpations: Abdomen is soft.     Tenderness: There is no abdominal tenderness.  Musculoskeletal:        General: No tenderness.     Cervical back: Normal range of motion and neck supple.  Skin:    General: Skin is warm and dry.  Neurological:     Comments: Able to open her eyes for short periods of time and respond to yes or no questions.  Psychiatric:        Behavior: Behavior normal.    ED Results / Procedures / Treatments   Labs (all labs ordered are listed, but only abnormal results are displayed) Labs Reviewed  CBC WITH DIFFERENTIAL/PLATELET - Abnormal; Notable for the following components:      Result Value   WBC 23.7 (*)    MCHC 29.7 (*)    Neutro Abs 18.6 (*)    Abs Immature Granulocytes 1.83 (*)    All other components within normal limits  BASIC METABOLIC PANEL - Abnormal; Notable for the following components:   Sodium 133 (*)    Potassium 6.8 (*)    Chloride 94 (*)    CO2 <7 (*)    Glucose, Bld 878 (*)    BUN 68 (*)    Creatinine, Ser 2.65 (*)    Calcium 8.8 (*)    GFR, Estimated 19 (*)    All other components within normal limits  BETA-HYDROXYBUTYRIC ACID - Abnormal; Notable for the  following components:   Beta-Hydroxybutyric Acid 15.11 (*)    All other components within normal limits  BLOOD GAS, VENOUS - Abnormal; Notable for the following components:   pH, Ven 6.878 (*)    pCO2, Ven <19.0 (*)    pO2, Ven  75.3 (*)    Bicarbonate 2.7 (*)    All other components within normal limits  I-STAT CHEM 8, ED - Abnormal; Notable for the following components:   Sodium 129 (*)    Potassium 6.7 (*)    BUN 69 (*)    Creatinine, Ser 2.20 (*)    Glucose, Bld >700 (*)    Calcium, Ion 1.09 (*)    TCO2 6 (*)    All other components within normal limits  CBG MONITORING, ED - Abnormal; Notable for the following components:   Glucose-Capillary >600 (*)    All other components within normal limits  CBG MONITORING, ED - Abnormal; Notable for the following components:   Glucose-Capillary >600 (*)    All other components within normal limits  CBG MONITORING, ED - Abnormal; Notable for the following components:   Glucose-Capillary >600 (*)    All other components within normal limits  RESP PANEL BY RT-PCR (FLU A&B, COVID) ARPGX2  URINALYSIS, ROUTINE W REFLEX MICROSCOPIC  BASIC METABOLIC PANEL  BASIC METABOLIC PANEL  BASIC METABOLIC PANEL  BETA-HYDROXYBUTYRIC ACID  HIV ANTIBODY (ROUTINE TESTING W REFLEX)  CBC  BETA-HYDROXYBUTYRIC ACID  BETA-HYDROXYBUTYRIC ACID  HEMOGLOBIN A1C  MAGNESIUM  MAGNESIUM    EKG None  Radiology DG Chest Port 1 View  Result Date: 07/27/2020 CLINICAL DATA:  Chest pain, hyperglycemia, altered mental status. EXAM: PORTABLE CHEST 1 VIEW COMPARISON:  03/24/2020. FINDINGS: Trachea is midline. Heart is enlarged. Thoracic aorta is calcified. Lungs are low in volume but clear. No pleural fluid. IMPRESSION: Low lung volumes.  No acute findings. Electronically Signed   By: Lorin Picket M.D.   On: 07/27/2020 10:53    Procedures Procedures   Medications Ordered in ED Medications  insulin regular, human (MYXREDLIN) 100 units/ 100 mL infusion (7 Units/hr  Intravenous Rate/Dose Change 07/27/20 1156)  lactated ringers infusion (0 mLs Intravenous Hold 07/27/20 1155)  dextrose 5 % in lactated ringers infusion (0 mLs Intravenous Hold 07/27/20 1122)  dextrose 50 % solution 0-50 mL (has no administration in time range)  sodium bicarbonate 150 mEq in sterile water 1,150 mL infusion (has no administration in time range)  heparin injection 5,000 Units (has no administration in time range)  lactated ringers bolus 2,000 mL (2,000 mLs Intravenous New Bag/Given 07/27/20 1025)  cefTRIAXone (ROCEPHIN) 2 g in sodium chloride 0.9 % 100 mL IVPB (2 g Intravenous New Bag/Given 07/27/20 1105)    ED Course  I have reviewed the triage vital signs and the nursing notes.  Pertinent labs & imaging results that were available during my care of the patient were reviewed by me and considered in my medical decision making (see chart for details).    MDM Rules/Calculators/A&P                          70 yo F with a cc of hyperglycemia.  Unable provide much history.  Patient having tachypnea and change in mental status.  We will give 2 L of IV fluids upfront blood work reassess.  I-STAT Chem-8 consistent with diabetic ketoacidosis with a bicarb of 6 blood sugar greater than 700.  Started on insulin infusion.  VBG has resulted with a pH of 6.8.  Will start on a bicarb drip.  Will discuss with ICU.  Rectal temperature was 92 placed on the [facility].  We will give a dose of Rocephin.  Patient is a bit more alert on reassessment.  She denies any cough  congestion or fever denies abdominal pain denies chest pain or shortness of breath denies urinary symptoms.  CRITICAL CARE Performed by: Cecilio Asper   Total critical care time: 80 minutes  Critical care time was exclusive of separately billable procedures and treating other patients.  Critical care was necessary to treat or prevent imminent or life-threatening deterioration.  Critical care was time spent personally  by me on the following activities: development of treatment plan with patient and/or surrogate as well as nursing, discussions with consultants, evaluation of patient's response to treatment, examination of patient, obtaining history from patient or surrogate, ordering and performing treatments and interventions, ordering and review of laboratory studies, ordering and review of radiographic studies, pulse oximetry and re-evaluation of patient's condition.  The patients results and plan were reviewed and discussed.   Any x-rays performed were independently reviewed by myself.   Differential diagnosis were considered with the presenting HPI.  Medications  insulin regular, human (MYXREDLIN) 100 units/ 100 mL infusion (7 Units/hr Intravenous Rate/Dose Change 07/27/20 1156)  lactated ringers infusion (0 mLs Intravenous Hold 07/27/20 1155)  dextrose 5 % in lactated ringers infusion (0 mLs Intravenous Hold 07/27/20 1122)  dextrose 50 % solution 0-50 mL (has no administration in time range)  sodium bicarbonate 150 mEq in sterile water 1,150 mL infusion (has no administration in time range)  heparin injection 5,000 Units (has no administration in time range)  lactated ringers bolus 2,000 mL (2,000 mLs Intravenous New Bag/Given 07/27/20 1025)  cefTRIAXone (ROCEPHIN) 2 g in sodium chloride 0.9 % 100 mL IVPB (2 g Intravenous New Bag/Given 07/27/20 1105)    Vitals:   07/27/20 1045 07/27/20 1100 07/27/20 1115 07/27/20 1145  BP: (!) 125/54 (!) 126/59 (!) 134/58 (!) 138/59  Pulse: 97 99 99 (!) 101  Resp: (!) 28 17 17 17   Temp:      TempSrc:      SpO2: 99% 100% 100% 100%  Weight:      Height:        Final diagnoses:  Diabetic ketoacidosis with coma associated with type 2 diabetes mellitus ([place])    Admission/ observation were discussed with the admitting physician, patient and/or family and they are comfortable with the plan.   Final Clinical Impression(s) / ED Diagnoses Final diagnoses:  Diabetic  ketoacidosis with coma associated with type 2 diabetes mellitus [facility])    Rx / DC Orders ED Discharge Orders     None        Deno Etienne, DO 07/27/20 1207

## 2020-07-27 NOTE — ED Triage Notes (Signed)
Patient started novolog yesterday. A&O to self only. LKN 7/10 9pm. Ems reports cbg-high. Patient denies pain. Ems administered 500NS.  Bp 114/50 HR-102 Spo2-100% RA

## 2020-07-27 NOTE — Progress Notes (Signed)
Inpatient Diabetes Program Recommendations  AACE/ADA: New Consensus Statement on Inpatient Glycemic Control (2015)  Target Ranges:  Prepandial:   less than 140 mg/dL      Peak postprandial:   less than 180 mg/dL (1-2 hours)      Critically ill patients:  140 - 180 mg/dL   Lab Results  Component Value Date   GLUCAP >600 (HH) 07/27/2020   HGBA1C 13.6 (A) 03/12/2020    Review of Glycemic Control Results for ZAWADI, APLIN (MRN [id number]) as of 07/27/2020 14:24  Ref. Range 07/27/2020 13:01 07/27/2020 13:47 07/27/2020 14:23  Glucose-Capillary Latest Ref Range: 70 - 99 mg/dL >600 (HH) >600 (HH) >600 (HH)   Diabetes history: DM 2 Outpatient Diabetes medications:  NPH 12 units bid Novolog sensitive tid with meals and HS Current orders for Inpatient glycemic control:  IV insulin Inpatient Diabetes Program Recommendations:    Agree with current orders. Will follow.  Thanks,  Adah Perl, RN, BC-ADM Inpatient Diabetes Coordinator Pager [phone] (8a-5p)

## 2020-07-27 NOTE — ED Notes (Signed)
DR. Tyrone Nine notified about patient's Chem-8 result.

## 2020-07-28 DIAGNOSIS — E1111 Type 2 diabetes mellitus with ketoacidosis with coma: Principal | ICD-10-CM

## 2020-07-28 LAB — GLUCOSE, CAPILLARY
Glucose-Capillary: 174 mg/dL — ABNORMAL HIGH (ref 70–99)
Glucose-Capillary: 208 mg/dL — ABNORMAL HIGH (ref 70–99)
Glucose-Capillary: 164 mg/dL — ABNORMAL HIGH (ref 70–99)
Glucose-Capillary: 140 mg/dL — ABNORMAL HIGH (ref 70–99)
Glucose-Capillary: 140 mg/dL — ABNORMAL HIGH (ref 70–99)
Glucose-Capillary: 109 mg/dL — ABNORMAL HIGH (ref 70–99)
Glucose-Capillary: 130 mg/dL — ABNORMAL HIGH (ref 70–99)
Glucose-Capillary: 123 mg/dL — ABNORMAL HIGH (ref 70–99)
Glucose-Capillary: 128 mg/dL — ABNORMAL HIGH (ref 70–99)
Glucose-Capillary: 129 mg/dL — ABNORMAL HIGH (ref 70–99)
Glucose-Capillary: 113 mg/dL — ABNORMAL HIGH (ref 70–99)
Glucose-Capillary: 145 mg/dL — ABNORMAL HIGH (ref 70–99)
Glucose-Capillary: 144 mg/dL — ABNORMAL HIGH (ref 70–99)
Glucose-Capillary: 141 mg/dL — ABNORMAL HIGH (ref 70–99)

## 2020-07-28 LAB — CBC
HCT: 37.7 % (ref 36.0–46.0)
Hemoglobin: 12.4 g/dL (ref 12.0–15.0)
MCH: 28.6 pg (ref 26.0–34.0)
MCHC: 32.9 g/dL (ref 30.0–36.0)
MCV: 86.9 fL (ref 80.0–100.0)
Platelets: 167 10*3/uL (ref 150–400)
RBC: 4.34 MIL/uL (ref 3.87–5.11)
RDW: 13.7 % (ref 11.5–15.5)
WBC: 27 10*3/uL — ABNORMAL HIGH (ref 4.0–10.5)
nRBC: 0 % (ref 0.0–0.2)

## 2020-07-28 LAB — BASIC METABOLIC PANEL
Anion gap: 12 (ref 5–15)
Anion gap: 11 (ref 5–15)
BUN: 56 mg/dL — ABNORMAL HIGH (ref 8–23)
BUN: 50 mg/dL — ABNORMAL HIGH (ref 8–23)
CO2: 29 mmol/L (ref 22–32)
CO2: 31 mmol/L (ref 22–32)
Calcium: 8.5 mg/dL — ABNORMAL LOW (ref 8.9–10.3)
Calcium: 8.2 mg/dL — ABNORMAL LOW (ref 8.9–10.3)
Chloride: 100 mmol/L (ref 98–111)
Chloride: 98 mmol/L (ref 98–111)
Creatinine, Ser: 1.91 mg/dL — ABNORMAL HIGH (ref 0.44–1.00)
Creatinine, Ser: 1.87 mg/dL — ABNORMAL HIGH (ref 0.44–1.00)
GFR, Estimated: 28 mL/min — ABNORMAL LOW (ref >60)
GFR, Estimated: 29 mL/min — ABNORMAL LOW (ref >60)
Glucose, Bld: 152 mg/dL — ABNORMAL HIGH (ref 70–99)
Glucose, Bld: 106 mg/dL — ABNORMAL HIGH (ref 70–99)
Potassium: 4.5 mmol/L (ref 3.5–5.1)
Potassium: 3.7 mmol/L (ref 3.5–5.1)
Sodium: 141 mmol/L (ref 135–145)
Sodium: 140 mmol/L (ref 135–145)

## 2020-07-28 LAB — BETA-HYDROXYBUTYRIC ACID
Beta-Hydroxybutyric Acid: 0.53 mmol/L — ABNORMAL HIGH (ref 0.05–0.27)
Beta-Hydroxybutyric Acid: 2.06 mmol/L — ABNORMAL HIGH (ref 0.05–0.27)

## 2020-07-28 LAB — MAGNESIUM
Magnesium: 2.4 mg/dL (ref 1.7–2.4)
Magnesium: 2.1 mg/dL (ref 1.7–2.4)
Magnesium: 2.3 mg/dL (ref 1.7–2.4)

## 2020-07-28 MED ORDER — INSULIN DETEMIR 100 UNIT/ML ~~LOC~~ SOLN
15.0000 [IU] | Freq: Two times a day (BID) | SUBCUTANEOUS | Status: DC
  Administered 2020-07-28: 15 [IU] via SUBCUTANEOUS
  Filled 2020-07-28: qty 0.15

## 2020-07-28 MED ORDER — INSULIN DETEMIR 100 UNIT/ML ~~LOC~~ SOLN: 15.0000 [IU] | Freq: Two times a day (BID) | SUBCUTANEOUS | Status: DC

## 2020-07-28 MED ORDER — INSULIN ASPART 100 UNIT/ML IJ SOLN: 1.0000 [IU] | INTRAMUSCULAR | Status: DC

## 2020-07-28 MED ORDER — INSULIN ASPART 100 UNIT/ML IJ SOLN
0.0000 [IU] | INTRAMUSCULAR | Status: DC
  Administered 2020-07-28 (×3): 2 [IU] via SUBCUTANEOUS
  Administered 2020-07-29: 8 [IU] via SUBCUTANEOUS
  Administered 2020-07-29 (×2): 3 [IU] via SUBCUTANEOUS
  Administered 2020-07-29: 2 [IU] via SUBCUTANEOUS
  Administered 2020-07-29: 8 [IU] via SUBCUTANEOUS
  Administered 2020-07-29: 11 [IU] via SUBCUTANEOUS
  Administered 2020-07-30: 8 [IU] via SUBCUTANEOUS
  Administered 2020-07-30 (×2): 2 [IU] via SUBCUTANEOUS

## 2020-07-28 MED ORDER — LACTATED RINGERS IV SOLN: INTRAVENOUS | Status: DC

## 2020-07-28 NOTE — Progress Notes (Addendum)
NAME:  Mandy Mullen, MRN:  [id number], DOB:  Sep 25, 1950, LOS: 1 ADMISSION DATE:  07/27/2020, CONSULTATION DATE:  07/27/20 REFERRING MD:  Tyrone Nine - EM, CHIEF COMPLAINT:  Altered mental status   History of Present Illness:  70 yo F PMH poorly controlled IDDM type 2, HTN, CKD IIIa, HLD who presented to ED 7/11 with AMS.  Per ED chart review, pt reportedly started novolog 2 days prior to presentation. Degree of encephalopathy unfortunately prevents thorough history. Patient denies n/v/d, sick contacts, chest pain, SOB. Denies missed insulin doses. In discussion with pts emergency contact / niece, pt was feeling lethargic x 1 week, but no sx of URI, GI illness but emergency contact is unable to provide further history or details.   In ED, workup reveals Glu 878 beta hydroxybutyric acid 15.11 bicarb <7, pH 6.8, WBC 23, Na 133 K 6.8 Cr 2.65. She was started on IVF but due to PIV difficulty had only received approx 251ml. Started on insulin gtt, bicarb.    PCCM consulted for admission   Pertinent  Medical History  IDDM HTN HLD CKD IIIA  Significant Hospital Events: Including procedures, antibiotic start and stop dates in addition to other pertinent events   7/11 presents to ED for AMS. PCCM admitting to ICU with DKA  7/12 off insulin gtt. AKI improving. Starting clears. Stable for transfer out of ICU.   Interim History / Subjective:  Transitioned off insulin gtt  Objective   Blood pressure (!) 120/51, pulse 89, temperature 98.2 F (36.8 C), temperature source Oral, resp. rate 16, height 5\' 3"  (1.6 m), weight 78 kg, SpO2 97 %.        Intake/Output Summary (Last 24 hours) at 07/28/2020 1048 Last data filed at 07/28/2020 0644 Gross per 24 hour  Intake 5652.96 ml  Output 925 ml  Net 4727.96 ml   Filed Weights   07/27/20 1006  Weight: 78 kg    Examination: General: Ill appearing older adult F reclined in bed NAD  HENT: NCAT pink tacky mm anicteric sclera  Lungs: CTAb. Even unlabored  on RA Cardiovascular: rr s1s2 cap refill brisk  Abdomen: soft round ndnt  Extremities: no acute joint deformity no cyanosis or clubbing  Neuro: AAOx3 following commands PERRL  GU: defer   Resolved Hospital Problem list    DKA AGMA  Hyperkalemia  Hyponatremia  Assessment & Plan:   Acute metabolic encephalopathy, improving -DKA, AKI.  -mild hyponatremia, doubt contributing  P -Minimize CNS depressing meds -endocrine and renal support as below   IDDM, poorly controlled -DKA improved. Off insulin gtt 7/12  -unclear catalyst for DKA -- pt/niece endorse med compliance. No sx acute illness preceding. Increased stress, possible that there have been some dietary changes without realizing  P -dc bicarb gtt -Basal + SSI  -DM coordinator following  -Would recommend OP endocrinology -- has had 2 DKA admissions in 2022 so far   AKI on CKD IIIa - improving AKI  P -cont IVF -trend renal indices and UOP   Leukocytosis -reactive v infectious.  -on prior DKA admit also had leukocytosis, favor that this is reactive.  -Did get rocephin x1 in ED. CXR is clear, Bcx without any growth so far. Afebrile  P -still defer abx at this time  -follow Bcx. UA not yet sent   Hx HTN Hx HLD  P -holding home meds   Best Practice (right click and "Reselect all SmartList Selections" daily)   Diet/type: clear liquids DVT prophylaxis: prophylactic heparin  GI  prophylaxis: N/A Lines: N/A Foley:  N/A Code Status:  full code Last date of multidisciplinary goals of care discussion pt and niece updated at bedside 7/12  Dispo: stable for transfer out of ICU 7/12, will ask [facility] to take over care starting 7/13   Labs   CBC: Recent Labs  Lab 07/27/20 1018 07/27/20 1029 07/27/20 1405 07/28/20 0254  WBC 23.7*  --  28.9* 27.0*  NEUTROABS 18.6*  --   --   --   HGB 12.5 14.3 12.4 12.4  HCT 42.1 42.0 42.9 37.7  MCV 97.2  --  100.2* 86.9  PLT 281  --  246 944    Basic Metabolic Panel: Recent Labs   Lab 07/27/20 1405 07/27/20 1821 07/27/20 2213 07/28/20 0254 07/28/20 0631  NA 129* 139 140 141 140  K 4.2 4.6 3.9 4.5 3.7  CL 71* 98 100 100 98  CO2 32 11* 23 29 31   GLUCOSE 501* 478* 270* 152* 106*  BUN 48* 65* 59* 56* 50*  CREATININE 1.70* 2.52* 2.18* 1.91* 1.87*  CALCIUM 5.2* 8.4* 8.5* 8.5* 8.2*  MG 2.0 2.6*  --  2.4  --    GFR: Estimated Creatinine Clearance: 28.5 mL/min (A) (by C-G formula based on SCr of 1.87 mg/dL (H)). Recent Labs  Lab 07/27/20 1018 07/27/20 1405 07/28/20 0254  WBC 23.7* 28.9* 27.0*    Liver Function Tests: No results for input(s): AST, ALT, ALKPHOS, BILITOT, PROT, ALBUMIN in the last 168 hours. No results for input(s): LIPASE, AMYLASE in the last 168 hours. No results for input(s): AMMONIA in the last 168 hours.  ABG    Component Value Date/Time   HCO3 2.7 (L) 07/27/2020 1018   TCO2 6 (L) 07/27/2020 1029   ACIDBASEDEF 23.3 (H) 03/24/2020 0832   O2SAT 84.9 07/27/2020 1018     Coagulation Profile: No results for input(s): INR, PROTIME in the last 168 hours.  Cardiac Enzymes: No results for input(s): CKTOTAL, CKMB, CKMBINDEX, TROPONINI in the last 168 hours.  HbA1C: Hemoglobin A1C  Date/Time Value Ref Range Status  03/12/2020 10:07 AM 13.6 (A) 4.0 - 5.6 % Final  12/27/2018 10:49 AM 13.0 (A) 4.0 - 5.6 % Final   HbA1c POC (<> result, manual entry)  Date/Time Value Ref Range Status  07/25/2019 10:26 AM >14.0 (A) 4.0 - 5.6 % Final   Hgb A1c MFr Bld  Date/Time Value Ref Range Status  07/27/2020 02:05 PM 12.9 (H) 4.8 - 5.6 % Final    Comment:    (NOTE) Pre diabetes:          5.7%-6.4%  Diabetes:              >6.4%  Glycemic control for   <7.0% adults with diabetes   12/21/2009 01:47 PM 9.9 %     CBG: Recent Labs  Lab 07/28/20 0640 07/28/20 0730 07/28/20 0746 07/28/20 0828 07/28/20 0846  GLUCAP 109* 123* 129* 128* 113*   CCT: n/a    Eliseo Gum MSN, AGACNP-BC [place] for  pager  07/28/2020, 10:48 AM

## 2020-07-28 NOTE — Progress Notes (Signed)
Discussed the patient's situation with our pharmacy team.  A pharmacy tech was able to communicate with a [place] the patient uses and we were only able to validate the last prescription for insulin filled was in March 2022.

## 2020-07-28 NOTE — Progress Notes (Signed)
Inpatient Diabetes Program Recommendations  AACE/ADA: New Consensus Statement on Inpatient Glycemic Control (2015)  Target Ranges:  Prepandial:   less than 140 mg/dL      Peak postprandial:   less than 180 mg/dL (1-2 hours)      Critically ill patients:  140 - 180 mg/dL   Lab Results  Component Value Date   GLUCAP 144 (H) 07/28/2020   HGBA1C 12.9 (H) 07/27/2020    Review of Glycemic Control  Diabetes history: DM2 Outpatient Diabetes medications: Novolin N 30 units in am and 15 units QHS, Novolog s/s 0-9 units TID (when needed) Current orders for Inpatient glycemic control: Novolog 0-15 units Q4H  HgbA1C - 12.9%  Inpatient Diabetes Program Recommendations:    Add Levemir 15 units QAM Add Novolog 3 units TID with meals if eating > 50%  Spoke with pt at bedside regarding her HgbA1C of 12.9%. Pt states she takes Novolin N 30 in am and 15 QHS. Does not take Novolog on a regular basis, "just when blood sugars are real high." Has hypoglycemia occasionally in early am. States she does check blood sugars "most every day." Said she does not ever miss her Novolin N insulin in am and hs. Pt says she has a lot of stress with taking care of her son, and she's a CNA at a facility. Pt thinks her HgbA1C is high due to her stress. Said she stays active and tries to eat consistently. Started CHO mod diet at noon today. Pt sees PCP at [facility] at [facility]. Last appt was in March 2022.  Will continue to follow glucose trends.  Thank you. Lorenda Peck, RD, LDN, CDE Inpatient Diabetes Coordinator [phone]

## 2020-07-28 NOTE — Progress Notes (Signed)
Start time changed on levemir order per verbal order from Dr. Lucile Shutters.

## 2020-07-28 NOTE — Progress Notes (Signed)
eLink Physician-Brief Progress Note Patient Name: Mandy Mullen DOB: [birth year] MRN: [id number]   Date of Service  07/28/2020  HPI/Events of Note  Patient on Insulin gtt for DKA  but the gap has closed.  eICU Interventions  Hyperglycemia transition orders entered.        Kerry Kass Klinton Candelas 07/28/2020, 5:38 AM

## 2020-07-29 LAB — CBC
HCT: 32.4 % — ABNORMAL LOW (ref 36.0–46.0)
Hemoglobin: 10.8 g/dL — ABNORMAL LOW (ref 12.0–15.0)
MCH: 29.1 pg (ref 26.0–34.0)
MCHC: 33.3 g/dL (ref 30.0–36.0)
MCV: 87.3 fL (ref 80.0–100.0)
Platelets: 181 10*3/uL (ref 150–400)
RBC: 3.71 MIL/uL — ABNORMAL LOW (ref 3.87–5.11)
RDW: 14.3 % (ref 11.5–15.5)
WBC: 17.4 10*3/uL — ABNORMAL HIGH (ref 4.0–10.5)
nRBC: 0 % (ref 0.0–0.2)

## 2020-07-29 LAB — GLUCOSE, CAPILLARY
Glucose-Capillary: 149 mg/dL — ABNORMAL HIGH (ref 70–99)
Glucose-Capillary: 171 mg/dL — ABNORMAL HIGH (ref 70–99)
Glucose-Capillary: 190 mg/dL — ABNORMAL HIGH (ref 70–99)
Glucose-Capillary: 281 mg/dL — ABNORMAL HIGH (ref 70–99)
Glucose-Capillary: 285 mg/dL — ABNORMAL HIGH (ref 70–99)
Glucose-Capillary: 335 mg/dL — ABNORMAL HIGH (ref 70–99)

## 2020-07-29 LAB — BASIC METABOLIC PANEL
Anion gap: 9 (ref 5–15)
BUN: 37 mg/dL — ABNORMAL HIGH (ref 8–23)
CO2: 31 mmol/L (ref 22–32)
Calcium: 8 mg/dL — ABNORMAL LOW (ref 8.9–10.3)
Chloride: 100 mmol/L (ref 98–111)
Creatinine, Ser: 1.37 mg/dL — ABNORMAL HIGH (ref 0.44–1.00)
GFR, Estimated: 42 mL/min — ABNORMAL LOW (ref >60)
Glucose, Bld: 170 mg/dL — ABNORMAL HIGH (ref 70–99)
Potassium: 3.3 mmol/L — ABNORMAL LOW (ref 3.5–5.1)
Sodium: 140 mmol/L (ref 135–145)

## 2020-07-29 LAB — URINALYSIS, ROUTINE W REFLEX MICROSCOPIC
Bilirubin Urine: NEGATIVE
Glucose, UA: 150 mg/dL — AB
Hgb urine dipstick: NEGATIVE
Ketones, ur: 20 mg/dL — AB
Leukocytes,Ua: NEGATIVE
Nitrite: NEGATIVE
Protein, ur: NEGATIVE mg/dL
Specific Gravity, Urine: 1.017 (ref 1.005–1.030)
pH: 7 (ref 5.0–8.0)

## 2020-07-29 LAB — MAGNESIUM: Magnesium: 2.3 mg/dL (ref 1.7–2.4)

## 2020-07-29 MED ORDER — INSULIN ASPART 100 UNIT/ML IJ SOLN
5.0000 [IU] | Freq: Three times a day (TID) | INTRAMUSCULAR | Status: DC
  Administered 2020-07-29 – 2020-07-30 (×4): 5 [IU] via SUBCUTANEOUS

## 2020-07-29 MED ORDER — POTASSIUM CHLORIDE CRYS ER 20 MEQ PO TBCR
40.0000 meq | EXTENDED_RELEASE_TABLET | Freq: Once | ORAL | Status: AC
  Administered 2020-07-29: 40 meq via ORAL
  Filled 2020-07-29: qty 2

## 2020-07-29 MED ORDER — INSULIN DETEMIR 100 UNIT/ML ~~LOC~~ SOLN
15.0000 [IU] | Freq: Every day | SUBCUTANEOUS | Status: DC
  Administered 2020-07-29 – 2020-07-30 (×2): 15 [IU] via SUBCUTANEOUS
  Filled 2020-07-29 (×2): qty 0.15

## 2020-07-29 NOTE — Progress Notes (Signed)
PT Cancellation Note  Patient Details Name: Mandy Mullen MRN: [id number] DOB: Dec 30, 1950   Cancelled Treatment:    Reason Eval/Treat Not Completed: Other (comment) Pt and nurse tech both report pt was just up to [facility] and became very dizzy.  Pt requests to remain in supine at this time however agreeable for PT to check back tomorrow.   Derrick Orris,KATHrine E 07/29/2020, 3:25 PM Jannette Spanner PT, DPT Acute Rehabilitation Services Pager: [phone] Office: [phone]

## 2020-07-29 NOTE — TOC Initial Note (Signed)
Transition of Care [facility]) - Initial/Assessment Note    Patient Details  Name: Mandy Mullen MRN: [id number] Date of Birth: 25-Apr-1950  Transition of [facility]) CM/SW Contact:    Lynnell Catalan, RN Phone Number: 07/29/2020, 12:53 PM  Clinical Narrative:                  Spoke with pt for dc planning. Pt states she takes Novolin N insulin at home and also has a Novolog Pen that she only takes when "my sugar is high" Pt states that she pays $25 dollars at [facility] for her Novolin N. She states that the hospital gave her the Novolog pen last time she was admitted in March. She states that she has not had to purchase a Novolog pen yet. Pt only has Medicare part A which does not include medication coverage. Pt encouraged to look into a supplement that covers medications. Because pt has chosen to only be covered by Medicare part A she would not qualify for the [facility] program. All prescriptions would be self pay.           Activities of Daily Living Home Assistive Devices/Equipment: CBG Meter, Eyeglasses ADL Screening (condition at time of admission) Patient's cognitive ability adequate to safely complete daily activities?: No Is the patient deaf or have difficulty hearing?: No Does the patient have difficulty seeing, even when wearing glasses/contacts?: No Does the patient have difficulty concentrating, remembering, or making decisions?: Yes Patient able to express need for assistance with ADLs?: Yes Does the patient have difficulty dressing or bathing?: Yes Independently performs ADLs?: No Communication: Independent Dressing (OT): Needs assistance Is this a change from baseline?: Change from baseline, expected to last >3 days Grooming: Needs assistance Is this a change from baseline?: Change from baseline, expected to last >3 days Feeding: Needs assistance Is this a change from baseline?: Change from baseline, expected to last >3 days Bathing: Needs assistance Is this a change from  baseline?: Change from baseline, expected to last >3 days Toileting: Needs assistance Is this a change from baseline?: Change from baseline, expected to last >3days In/Out Bed: Needs assistance Is this a change from baseline?: Change from baseline, expected to last >3 days Walks in Home: Needs assistance Is this a change from baseline?: Change from baseline, expected to last >3 days Does the patient have difficulty walking or climbing stairs?: Yes (secondary to weakness) Weakness of Legs: Both Weakness of Arms/Hands: None  Permission Sought/Granted                  Emotional Assessment              Admission diagnosis:  DKA (diabetic ketoacidosis) ([place]) [E11.10] Diabetic ketoacidosis with coma associated with type 2 diabetes mellitus ([place]) [E11.11] Patient Active Problem List   Diagnosis Date Noted   DKA (diabetic ketoacidosis) ([place]) 67/61/9509   Acute metabolic encephalopathy    Acute renal failure superimposed on stage 3a chronic kidney disease ([place])    Diabetic keto-acidosis ([employer]) 03/24/2020   Normocytic anemia 04/24/2019   Lipoma of head 12/28/2018   CKD stage 3 due to type 2 diabetes mellitus ([place]) 07/29/2016   Healthcare maintenance 02/26/2014   Hypertension associated with diabetes ([place]) 12/17/2007   Hyperlipidemia 01/13/2006   Uncontrolled type 2 diabetes mellitus ([place]) 12/20/2005   TOTAL ABDOMINAL HYSTERECTOMY, HX OF 12/20/2005   PCP:  Lucious Groves, DO Pharmacy:   [facility] [address] [place] [address] Phone: [phone] Fax:  [place] ([place]), [place] - [address] [place] ([place]) [place] [address] Phone: [phone] Fax: [phone]     Social Determinants of Health (SDOH) Interventions    Readmission Risk Interventions No flowsheet data found.

## 2020-07-29 NOTE — Progress Notes (Signed)
PROGRESS NOTE    ADAIAH JASKOT  TKW:[id number] DOB: 20-Dec-1950 DOA: 07/27/2020 PCP: Lucious Groves, DO   Brief Narrative: A 70 year old female with poorly controlled type 2 diabetes CKD stage IIIa hyperlipidemia and hypertension admitted 7/11 to PCCM with change in mental status found to have diabetic ketoacidosis.  Her pH on admission was 6.8 with a bicarb less than 7 beta hydroxybutyrate acid was 15.11 glucose was 878.  She was treated with IV fluids insulin drip and bicarb. TR[facility] picked up this patient on 07/29/2020.  Assessment & Plan:   Active Problems:   DKA (diabetic ketoacidosis) ([place])  #1 DKA poorly controlled type 2 diabetes-patient is not sure why she gets into DKA or has hypoglycemia at home.  She reports she has not missed any doses of insulin.  She denies any change in eating habits however she does have a lot of stress. Her hemoglobin A1c is 12.9.  Will increase Levemir to 15 units in the morning and add NovoLog 5 units 3 times daily with meals. She has had 2 admissions in this year for DKA. Will refer her to outpatient endocrinology on discharge. CBG (last 3)  Recent Labs    07/29/20 0429 07/29/20 0745 07/29/20 1248  GLUCAP 171* 190* 335*     #2 AKI on CKD stage IIIa resolving with IV fluids  #3 history of hypertension  #4 history of hyperlipidemia  #5 hypokalemia replete.    Estimated body mass index is 30.46 kg/m as calculated from the following:   Height as of this encounter: 5\' 3"  (1.6 m).   Weight as of this encounter: 78 kg.  DVT prophylaxis: Heparin  code Status: Full code Family Communication: None at bedside Disposition Plan:  Status is: Inpatient  Remains inpatient appropriate because:IV treatments appropriate due to intensity of illness or inability to take PO  Dispo: The patient is from: Home              Anticipated d/c is to: Home              Patient currently is not medically stable to d/c.   Difficult to place patient No        Consultants: PCCM   Procedures: None Antimicrobials: None Subjective:   Objective: Vitals:   07/29/20 0155 07/29/20 0433 07/29/20 1012 07/29/20 1444  BP: 119/65 123/65 128/65 138/66  Pulse: 95 85 91 93  Resp: 16 14  15   Temp: 98.8 F (37.1 C) 98.4 F (36.9 C) 98.4 F (36.9 C) 98.2 F (36.8 C)  TempSrc: Oral Oral Oral   SpO2: 94% 94% 95% 91%  Weight:      Height:        Intake/Output Summary (Last 24 hours) at 07/29/2020 1505 Last data filed at 07/29/2020 0930 Gross per 24 hour  Intake 1440.53 ml  Output 500 ml  Net 940.53 ml   Filed Weights   07/27/20 1006  Weight: 78 kg    Examination:  General exam: Appears calm and comfortable  Respiratory system: Clear to auscultation. Respiratory effort normal. Cardiovascular system: S1 & S2 heard, RRR. No JVD, murmurs, rubs, gallops or clicks. No pedal edema. Gastrointestinal system: Abdomen is nondistended, soft and nontender. No organomegaly or masses felt. Normal bowel sounds heard. Central nervous system: Alert and oriented. No focal neurological deficits. Extremities: Symmetric 5 x 5 power. Skin: No rashes, lesions or ulcers Psychiatry: Judgement and insight appear normal. Mood & affect appropriate.     Data Reviewed: I have personally reviewed  following labs and imaging studies  CBC: Recent Labs  Lab 07/27/20 1018 07/27/20 1029 07/27/20 1405 07/28/20 0254 07/29/20 0400  WBC 23.7*  --  28.9* 27.0* 17.4*  NEUTROABS 18.6*  --   --   --   --   HGB 12.5 14.3 12.4 12.4 10.8*  HCT 42.1 42.0 42.9 37.7 32.4*  MCV 97.2  --  100.2* 86.9 87.3  PLT 281  --  246 167 132   Basic Metabolic Panel: Recent Labs  Lab 07/27/20 1821 07/27/20 2213 07/28/20 0254 07/28/20 0631 07/28/20 1025 07/28/20 2021 07/29/20 0400  NA 139 140 141 140  --   --  140  K 4.6 3.9 4.5 3.7  --   --  3.3*  CL 98 100 100 98  --   --  100  CO2 11* 23 29 31   --   --  31  GLUCOSE 478* 270* 152* 106*  --   --  170*  BUN 65* 59* 56* 50*  --    --  37*  CREATININE 2.52* 2.18* 1.91* 1.87*  --   --  1.37*  CALCIUM 8.4* 8.5* 8.5* 8.2*  --   --  8.0*  MG 2.6*  --  2.4  --  2.1 2.3 2.3   GFR: Estimated Creatinine Clearance: 38.8 mL/min (A) (by C-G formula based on SCr of 1.37 mg/dL (H)). Liver Function Tests: No results for input(s): AST, ALT, ALKPHOS, BILITOT, PROT, ALBUMIN in the last 168 hours. No results for input(s): LIPASE, AMYLASE in the last 168 hours. No results for input(s): AMMONIA in the last 168 hours. Coagulation Profile: No results for input(s): INR, PROTIME in the last 168 hours. Cardiac Enzymes: No results for input(s): CKTOTAL, CKMB, CKMBINDEX, TROPONINI in the last 168 hours. BNP (last 3 results) No results for input(s): PROBNP in the last 8760 hours. HbA1C: Recent Labs    07/27/20 1405  HGBA1C 12.9*   CBG: Recent Labs  Lab 07/28/20 1950 07/29/20 0017 07/29/20 0429 07/29/20 0745 07/29/20 1248  GLUCAP 141* 149* 171* 190* 335*   Lipid Profile: No results for input(s): CHOL, HDL, LDLCALC, TRIG, CHOLHDL, LDLDIRECT in the last 72 hours. Thyroid Function Tests: No results for input(s): TSH, T4TOTAL, FREET4, T3FREE, THYROIDAB in the last 72 hours. Anemia Panel: No results for input(s): VITAMINB12, FOLATE, FERRITIN, TIBC, IRON, RETICCTPCT in the last 72 hours. Sepsis Labs: No results for input(s): PROCALCITON, LATICACIDVEN in the last 168 hours.  Recent Results (from the past 240 hour(s))  Resp Panel by RT-PCR (Flu A&B, Covid) Nasopharyngeal Swab     Status: None   Collection Time: 07/27/20 11:09 AM   Specimen: Nasopharyngeal Swab; Nasopharyngeal(NP) swabs in vial transport medium  Result Value Ref Range Status   SARS Coronavirus 2 by RT PCR NEGATIVE NEGATIVE Final    Comment: (NOTE) SARS-CoV-2 target nucleic acids are NOT DETECTED.  The SARS-CoV-2 RNA is generally detectable in upper respiratory specimens during the acute phase of infection. The lowest concentration of SARS-CoV-2 viral copies  this assay can detect is 138 copies/mL. A negative result does not preclude SARS-Cov-2 infection and should not be used as the sole basis for treatment or other patient management decisions. A negative result may occur with  improper specimen collection/handling, submission of specimen other than nasopharyngeal swab, presence of viral mutation(s) within the areas targeted by this assay, and inadequate number of viral copies(<138 copies/mL). A negative result must be combined with clinical observations, patient history, and epidemiological information. The expected result is Negative.  Fact Sheet for Patients:  EntrepreneurPulse.com.au  Fact Sheet for Healthcare Providers:  IncredibleEmployment.be  This test is no t yet approved or cleared by the [place] FDA and  has been authorized for detection and/or diagnosis of SARS-CoV-2 by FDA under an Emergency Use Authorization (EUA). This EUA will remain  in effect (meaning this test can be used) for the duration of the COVID-19 declaration under Section 564(b)(1) of the Act, 21 U.S.C.section 360bbb-3(b)(1), unless the authorization is terminated  or revoked sooner.       Influenza A by PCR NEGATIVE NEGATIVE Final   Influenza B by PCR NEGATIVE NEGATIVE Final    Comment: (NOTE) The Xpert Xpress SARS-CoV-2/FLU/RSV plus assay is intended as an aid in the diagnosis of influenza from Nasopharyngeal swab specimens and should not be used as a sole basis for treatment. Nasal washings and aspirates are unacceptable for Xpert Xpress SARS-CoV-2/FLU/RSV testing.  Fact Sheet for Patients: EntrepreneurPulse.com.au  Fact Sheet for Healthcare Providers: IncredibleEmployment.be  This test is not yet approved or cleared by the [place] FDA and has been authorized for detection and/or diagnosis of SARS-CoV-2 by FDA under an Emergency Use Authorization (EUA). This EUA  will remain in effect (meaning this test can be used) for the duration of the COVID-19 declaration under Section 564(b)(1) of the Act, 21 U.S.C. section 360bbb-3(b)(1), unless the authorization is terminated or revoked.  Performed at [facility], [place] [address], [place], [place] [address]   Culture, blood (Routine X 2) w Reflex to ID Panel     Status: None (Preliminary result)   Collection Time: 07/27/20  1:40 PM   Specimen: BLOOD  Result Value Ref Range Status   Specimen Description   Final    BLOOD BLOOD RIGHT HAND Performed at [facility] [address] [place], [place] [address]    Special Requests   Final    BOTTLES DRAWN AEROBIC AND ANAEROBIC Blood Culture results may not be optimal due to an inadequate volume of blood received in culture bottles Performed at [facility] [address] [place], [place] [address]    Culture   Final    NO GROWTH 2 DAYS Performed at [facility] [address] [place], [place] [address]    Report Status PENDING  Incomplete  Culture, blood (Routine X 2) w Reflex to ID Panel     Status: None (Preliminary result)   Collection Time: 07/27/20  2:05 PM   Specimen: BLOOD  Result Value Ref Range Status   Specimen Description   Final    BLOOD BLOOD LEFT HAND Performed at [facility] [address], [place], [place] [address]    Special Requests   Final    BOTTLES DRAWN AEROBIC ONLY Blood Culture adequate volume Performed at [facility] [address] [place], [place] [address]    Culture   Final    NO GROWTH 2 DAYS Performed at [facility] [address] [place], [place] [address]    Report Status PENDING  Incomplete  MRSA Next Gen by PCR, Nasal     Status: None   Collection Time: 07/27/20  5:51 PM   Specimen: Nasal Mucosa; Nasal Swab  Result Value Ref Range Status   MRSA by PCR Next Gen NOT DETECTED NOT DETECTED Final     Comment: (NOTE) The GeneXpert MRSA Assay (FDA approved for NASAL specimens only), is one component of a comprehensive MRSA colonization surveillance  program. It is not intended to diagnose MRSA infection nor to guide or monitor treatment for MRSA infections. Test performance is not FDA approved in patients less than 64 years old. Performed at [facility], [place] [address], [place], [place] [address]          Radiology Studies: No results found.      Scheduled Meds:  heparin  5,000 Units Subcutaneous Q8H   insulin aspart  0-15 Units Subcutaneous Q4H   mouth rinse  15 mL Mouth Rinse BID   Continuous Infusions:  sodium chloride Stopped (07/27/20 1637)   lactated ringers 75 mL/hr at 07/29/20 0454     LOS: 2 days    Time spent: 40 min    Georgette Shell, MD 07/29/2020, 3:05 PM

## 2020-07-30 ENCOUNTER — Other Ambulatory Visit (HOSPITAL_COMMUNITY): Payer: Self-pay

## 2020-07-30 LAB — COMPREHENSIVE METABOLIC PANEL
ALT: 19 U/L (ref 0–44)
AST: 19 U/L (ref 15–41)
Albumin: 2.5 g/dL — ABNORMAL LOW (ref 3.5–5.0)
Alkaline Phosphatase: 81 U/L (ref 38–126)
Anion gap: 4 — ABNORMAL LOW (ref 5–15)
BUN: 21 mg/dL (ref 8–23)
CO2: 31 mmol/L (ref 22–32)
Calcium: 8.3 mg/dL — ABNORMAL LOW (ref 8.9–10.3)
Chloride: 103 mmol/L (ref 98–111)
Creatinine, Ser: 0.97 mg/dL (ref 0.44–1.00)
GFR, Estimated: >60 mL/min (ref >60)
Glucose, Bld: 117 mg/dL — ABNORMAL HIGH (ref 70–99)
Potassium: 3.6 mmol/L (ref 3.5–5.1)
Sodium: 138 mmol/L (ref 135–145)
Total Bilirubin: 0.7 mg/dL (ref 0.3–1.2)
Total Protein: 4.9 g/dL — ABNORMAL LOW (ref 6.5–8.1)

## 2020-07-30 LAB — CBC
HCT: 33.4 % — ABNORMAL LOW (ref 36.0–46.0)
Hemoglobin: 11.2 g/dL — ABNORMAL LOW (ref 12.0–15.0)
MCH: 29.2 pg (ref 26.0–34.0)
MCHC: 33.5 g/dL (ref 30.0–36.0)
MCV: 87 fL (ref 80.0–100.0)
Platelets: 162 10*3/uL (ref 150–400)
RBC: 3.84 MIL/uL — ABNORMAL LOW (ref 3.87–5.11)
RDW: 14.5 % (ref 11.5–15.5)
WBC: 12 10*3/uL — ABNORMAL HIGH (ref 4.0–10.5)
nRBC: 0 % (ref 0.0–0.2)

## 2020-07-30 LAB — GLUCOSE, CAPILLARY
Glucose-Capillary: 130 mg/dL — ABNORMAL HIGH (ref 70–99)
Glucose-Capillary: 90 mg/dL (ref 70–99)
Glucose-Capillary: 148 mg/dL — ABNORMAL HIGH (ref 70–99)
Glucose-Capillary: 103 mg/dL — ABNORMAL HIGH (ref 70–99)
Glucose-Capillary: 257 mg/dL — ABNORMAL HIGH (ref 70–99)

## 2020-07-30 LAB — MAGNESIUM: Magnesium: 2 mg/dL (ref 1.7–2.4)

## 2020-07-30 MED ORDER — NOVOLIN 70/30 (70-30) 100 UNIT/ML ~~LOC~~ SUSP
SUBCUTANEOUS | 3 refills | Status: DC
  Filled 2020-07-30: qty 10, fill #0
  Filled 2020-07-30: qty 10, 25d supply, fill #0

## 2020-07-30 NOTE — Evaluation (Signed)
Physical Therapy Evaluation Patient Details Name: Mandy Mullen MRN: [id number] DOB: February 26, 1950 Today's Date: 07/30/2020   History of Present Illness  pt is a 70 y.o. female presenting with AMS found to have diabetic ketoacidosis. PMH significant for  type 2 diabetes (poorly controlled), CKD stage IIIa, hyperlipidemia, and hypertension  Clinical Impression  Pt is a 70 y.o. female with above HPI. Pt required MIN A for sit to stand transfers and ambulation today with use of RW due to increased LE weakness and balance deficits. Per pt and RN, pt currently arranged for d/c to home this afternoon. PT educated pt on benefits of continued skilled PT upon d/c to assist with strength gains and balance to improve current functional level. Pt expressed no concerns about d/c to home , PT discussing with pt importance of assist for OOB mobility at this time for fall prevention, pt verbalized understanding. Pt reports that she lives with son (who she is caregiver for) and grandson and that she has cousins who would be available to provide assistance during the day while grandson is away at work. Pt will benefit from continued skilled PT in order to address listed impairments and maximize functional mobility to return to prior level of independence.     Follow Up Recommendations Home health PT;Supervision for mobility/OOB    Equipment Recommendations  None recommended by PT (pt reports she owns RW)    Recommendations for Other Services       Precautions / Restrictions Precautions Precautions: Fall Restrictions Weight Bearing Restrictions: No      Mobility  Bed Mobility Overal bed mobility: Needs Assistance Bed Mobility: Supine to Sit     Supine to sit: Supervision;HOB elevated     General bed mobility comments: supervision fro safety, pt with reported "whooziness" upon entry which improved with duration of session, reaching resolution by EOS. Pt states that it has been much improved since  initial onset few days ago.    Transfers Overall transfer level: Needs assistance Equipment used: None;Rolling walker (2 wheeled) Transfers: Sit to/from Stand Sit to Stand: Min assist         General transfer comment: pt with failed sit to stand attempts x 2 without use of AD. Pt able to achieve standing following x2 attempts wtih use of RW and cues for forward weight shifting and safe hand placememtn with increased time. Cues for upright posture to achieve full erect standing position. Pt noted to be retropulsive requiring MIN A for stability/balance.  Ambulation/Gait Ambulation/Gait assistance: Min assist;Min guard Gait Distance (Feet): 160 Feet Assistive device: Rolling walker (2 wheeled) Gait Pattern/deviations: Step-to pattern;Decreased stride length;Leaning posteriorly;Narrow base of support Gait velocity: decr   General Gait Details: Pt performed pre gait marching with use of B UEs on RW and MIN A for stability/balance, pt retropulsive and with LEs intermittently leaning against bed for support while standing. Pt progressed to lateral side steps along EOB multiple times with MIN A for stability.Therapeutic rest break provided prior to ambulation. Pt ambulated with MIN asisst for stability/balance and MIN guard for ~29ft when returning to room. Pt with narrow BOS which improved with further distance, multiple staggering steps and intermittent posterior leaning requiring MIN A from PT to maintain balance (chair follow provided to maximize pt safety). Pt reported LE fatigue with ambulation R>L.  Stairs            Wheelchair Mobility    Modified Rankin (Stroke Patients Only)       Balance Overall balance assessment:  Needs assistance Sitting-balance support: Feet supported Sitting balance-Leahy Scale: Good   Postural control: Posterior lean Standing balance support: Bilateral upper extremity supported Standing balance-Leahy Scale: Poor Standing balance comment: use of RW  and assist from PT to maintain upright standing balance                 Standardized Balance Assessment Standardized Balance Assessment : Berg Balance Test Berg Balance Test Sit to Stand: Needs minimal aid to stand or to stabilize Standing Unsupported: Able to stand 2 minutes with supervision Sitting with Back Unsupported but Feet Supported on Floor or Stool: Able to sit safely and securely 2 minutes Stand to Sit: Controls descent by using hands Transfers: Needs one person to assist Standing Unsupported with Eyes Closed: Able to stand 10 seconds with supervision Standing Ubsupported with Feet Together: Able to place feet together independently but unable to hold for 30 seconds From Standing, Reach Forward with Outstretched Arm: Loses balance while trying/requires external support From Standing Position, Pick up Object from Floor: Able to pick up shoe, needs supervision From Standing Position, Turn to Look Behind Over each Shoulder: Needs assist to keep from losing balance and falling Turn 360 Degrees: Needs assistance while turning Standing Unsupported, Alternately Place Feet on Step/Stool: Needs assistance to keep from falling or unable to try Standing Unsupported, One Foot in Front: Needs help to step but can hold 15 seconds Standing on One Leg: Unable to try or needs assist to prevent fall Total Score: 21         Pertinent Vitals/Pain Pain Assessment: No/denies pain    Home Living Family/patient expects to be discharged to:: Private residence Living Arrangements: Children;Other relatives Available Help at Discharge: Family Type of Home: House Home Access: Stairs to enter Entrance Stairs-Rails: Can reach both Entrance Stairs-Number of Steps: 5 Home Layout: One level Home Equipment: Walker - 2 wheels;Grab bars - tub/shower Additional Comments: Pt lives with son and grandson. 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 Pt's grandson works L[employer] day. Per pt, she has  cousins who may provide assist at home if needed.    Prior Function Level of Independence: Independent               Hand Dominance   Dominant Hand: Right    Extremity/Trunk Assessment   Upper Extremity Assessment Upper Extremity Assessment: Overall WFL for tasks assessed    Lower Extremity Assessment Lower Extremity Assessment: RLE deficits/detail;LLE deficits/detail RLE Deficits / Details: 5/5 B DF/PF strength, grossly 4/5, assessed while EOB RLE Sensation: WNL LLE Deficits / Details: 5/5 B DF/PF strength, grossly 4/5, assessed while EOB LLE Sensation: WNL    Cervical / Trunk Assessment Cervical / Trunk Assessment: Normal  Communication   Communication: No difficulties  Cognition Arousal/Alertness: Awake/alert Behavior During Therapy: WFL for tasks assessed/performed Overall Cognitive Status: Within Functional Limits for tasks assessed                                        General Comments General comments (skin integrity, edema, etc.): PT spoke with pt's niece (who will be taking pt home) over phone to educate on fall prevention, assist required during session, pt's need for use of RW, and assist for OOB mobility at this time to maximize pt safety upon d/c, pt's niece verbalized understanding and reported no questions at this time.    Exercises  Assessment/Plan    PT Assessment Patient needs continued PT services  PT Problem List Decreased strength;Decreased activity tolerance;Decreased balance;Decreased mobility;Decreased knowledge of use of DME       PT Treatment Interventions DME instruction;Gait training;Stair training;Functional mobility training;Therapeutic activities;Therapeutic exercise;Patient/family education;Balance training    PT Goals (Current goals can be found in the Care Plan section)  Acute Rehab PT Goals Patient Stated Goal: go home and be able to walk better PT Goal Formulation: With patient Time For Goal Achievement:  08/13/20 Potential to Achieve Goals: Good    Frequency Min 5X/week   Barriers to discharge        Co-evaluation               AM-PAC PT "6 Clicks" Mobility  Outcome Measure Help needed turning from your back to your side while in a flat bed without using bedrails?: None Help needed moving from lying on your back to sitting on the side of a flat bed without using bedrails?: A Little Help needed moving to and from a bed to a chair (including a wheelchair)?: A Little Help needed standing up from a chair using your arms (e.g., wheelchair or bedside chair)?: A Little Help needed to walk in hospital room?: A Little Help needed climbing 3-5 steps with a railing? : A Lot 6 Click Score: 18    End of Session Equipment Utilized During Treatment: Gait belt Activity Tolerance: Patient tolerated treatment well Patient left: in chair;with call bell/phone within reach;with chair alarm set Nurse Communication: Mobility status PT Visit Diagnosis: Unsteadiness on feet (R26.81);Other abnormalities of gait and mobility (R26.89);Muscle weakness (generalized) (M62.81)    Time: 1572-6203 PT Time Calculation (min) (ACUTE ONLY): 54 min   Charges:   PT Evaluation $PT Eval Low Complexity: 1 Low PT Treatments $Gait Training: 8-22 mins $Therapeutic Activity: 23-37 mins        Festus Barren PT, DPT  Acute Rehabilitation Services  Office [phone]   07/30/2020, 3:15 PM

## 2020-07-30 NOTE — Discharge Summary (Signed)
Physician Discharge Summary  Mandy Mullen MHD:[id number] DOB: 20-Nov-1950 DOA: 07/27/2020  PCP: Lucious Groves, DO  Admit date: 07/27/2020 Discharge date: 07/30/2020  Admitted From: Home Disposition: Home  Recommendations for Outpatient Follow-up:  Follow up with PCP in 1-2 weeks Please obtain BMP/CBC in one week Please follow up with endocrinology Home Health: None Equipment/Devices: None  Discharge Condition: Stable CODE STATUS: Full code Diet recommendation: Carb modified  Brief/Interim Summary:70 year old female with poorly controlled type 2 diabetes CKD stage IIIa hyperlipidemia and hypertension admitted 7/11 to PCCM with change in mental status found to have diabetic ketoacidosis.  Her pH on admission was 6.8 with a bicarb less than 7 beta hydroxybutyrate acid was 15.11 glucose was 878.  She was treated with IV fluids insulin drip and bicarb. [facility] picked up this patient on 07/29/2020.  Discharge Diagnoses:  Active Problems:   DKA (diabetic ketoacidosis) ([place]) #1 DKA poorly controlled type 2 diabetes-patient is not sure why she gets into DKA   She reports she has not missed any doses of insulin.  She denies any change in eating habits however she does have a lot of stress. Her hemoglobin A1c is 12.9.   I have given her prescriptions for Novolin 70/30 20 units twice a day (instead of giving her Novolin NPH and regular separately hoping to help take her medication, she was not taking short acting insulin at home even though she had a prescription.) She has had 2 admissions in this year for DKA. Will refer her to outpatient endocrinology on discharge. CBG (last 3)  Recent Labs    07/30/20 0425 07/30/20 0729 07/30/20 1151  GLUCAP 130* 90 148*       #2 AKI on CKD stage IIIa resolved with IV fluids   #3 history of hypertension her blood pressure has been normal throughout the hospital stay.  She has stopped taking Norvasc losartan at home.   #4 history of hyperlipidemia  statin restarted    #5 hypokalemia repleted potassium 3.6 on discharge magnesium 2.0.  Estimated body mass index is 30.46 kg/m as calculated from the following:   Height as of this encounter: 5\' 3"  (1.6 m).   Weight as of this encounter: 78 kg.  Discharge Instructions  Discharge Instructions     Ambulatory referral to Endocrinology   Complete by: As directed    Dka twice this year   Diet - low sodium heart healthy   Complete by: As directed    Diet Carb Modified   Complete by: As directed    Increase activity slowly   Complete by: As directed       Allergies as of 07/30/2020       Reactions   Lisinopril Itching, Swelling, Rash   Angioedema and diffuse body rash   Bactrim [sulfamethoxazole-trimethoprim] Other (See Comments)   Erythroderma like reaction over 80% of her body    Eggs Or Egg-derived Products Nausea Only, Other (See Comments)   REACTION: Nausea. Chills/fevers x 6 months (after a flu shot in the 1990s).   Metformin And Related Rash   Shellfish Allergy Hives, Itching, Swelling, Rash        Medication List     STOP taking these medications    amLODipine 10 MG tablet Commonly known as: NORVASC   insulin NPH Human 100 UNIT/ML injection Commonly known as: NOVOLIN N   losartan-hydrochlorothiazide 100-25 MG tablet Commonly known as: HYZAAR   senna-docusate 8.6-50 MG tablet Commonly known as: Senokot-S       TAKE  these medications    ferrous sulfate 325 (65 FE) MG tablet Take 325 mg by mouth daily.   glucose blood test strip Commonly known as: Contour Next Test Test blood sugar 3 times daily. IM program   insulin aspart 100 UNIT/ML FlexPen Commonly known as: NOVOLOG CBG 70 - 120: 0 units CBG 121 - 150: 1 unit CBG 151 - 200: 2 units CBG 201 - 250: 3 units CBG 251 - 300: 5 units CBG 301 - 350: 7 units CBG 351 - 400: 9 units What changed:  how much to take how to take this when to take this additional instructions   INSULIN SYRINGE  .3CC/31GX5/16" 31G X 5/16" 0.3 ML Misc 1 Dose by Does not apply route 2 (two) times daily. IM program   NovoLIN 70/30 (70-30) 100 UNIT/ML injection Generic drug: insulin NPH-regular Human Take 20 units bid   ONE TOUCH LANCETS Misc Use to check blood sugars   Microlet Lancets Misc 1 Device by Does not apply route 3 (three) times daily.   PEN NEEDLES 29GX1/2" 29G X 12MM Misc To use for novolog injection TIDAC.   rosuvastatin 20 MG tablet Commonly known as: Crestor Take 1 tablet (20 mg total) by mouth daily.   VITAMIN C PO Take 1 tablet by mouth daily.        Follow-up Information     Lucious Groves, DO Follow up.   Specialty: Internal Medicine Contact information: [address]  [place] [address] [phone]                Allergies  Allergen Reactions   Lisinopril Itching, Swelling and Rash    Angioedema and diffuse body rash   Bactrim [Sulfamethoxazole-Trimethoprim] Other (See Comments)    Erythroderma like reaction over 80% of her body    Eggs Or Egg-Derived Products Nausea Only and Other (See Comments)    REACTION: Nausea. Chills/fevers x 6 months (after a flu shot in the 1990s).   Metformin And Related Rash   Shellfish Allergy Hives, Itching, Swelling and Rash    Consultations: pccm   Procedures/Studies: DG Chest Port 1 View  Result Date: 07/27/2020 CLINICAL DATA:  Chest pain, hyperglycemia, altered mental status. EXAM: PORTABLE CHEST 1 VIEW COMPARISON:  03/24/2020. FINDINGS: Trachea is midline. Heart is enlarged. Thoracic aorta is calcified. Lungs are low in volume but clear. No pleural fluid. IMPRESSION: Low lung volumes.  No acute findings. Electronically Signed   By: Lorin Picket M.D.   On: 07/27/2020 10:53   (Echo, Carotid, EGD, Colonoscopy, ERCP)    Subjective: She is resting in bed in no acute distress anxious to go home no new complaints  Discharge Exam: Vitals:   07/30/20 0439 07/30/20 0955  BP: 128/61 127/68  Pulse: 78  87  Resp: 18 15  Temp: 98.2 F (36.8 C) 99.1 F (37.3 C)  SpO2: 97% 99%   Vitals:   07/29/20 1900 07/29/20 2353 07/30/20 0439 07/30/20 0955  BP: 140/64 (!) 140/58 128/61 127/68  Pulse: 89 87 78 87  Resp:  14 18 15   Temp: 99.1 F (37.3 C) 99.1 F (37.3 C) 98.2 F (36.8 C) 99.1 F (37.3 C)  TempSrc: Oral Oral Oral Oral  SpO2:  97% 97% 99%  Weight:      Height:        General: Pt is alert, awake, not in acute distress Cardiovascular: RRR, S1/S2 +, no rubs, no gallops Respiratory: CTA bilaterally, no wheezing, no rhonchi Abdominal: Soft, NT, ND, bowel  sounds + Extremities: no edema, no cyanosis    The results of significant diagnostics from this hospitalization (including imaging, microbiology, ancillary and laboratory) are listed below for reference.     Microbiology: Recent Results (from the past 240 hour(s))  Resp Panel by RT-PCR (Flu A&B, Covid) Nasopharyngeal Swab     Status: None   Collection Time: 07/27/20 11:09 AM   Specimen: Nasopharyngeal Swab; Nasopharyngeal(NP) swabs in vial transport medium  Result Value Ref Range Status   SARS Coronavirus 2 by RT PCR NEGATIVE NEGATIVE Final    Comment: (NOTE) SARS-CoV-2 target nucleic acids are NOT DETECTED.  The SARS-CoV-2 RNA is generally detectable in upper respiratory specimens during the acute phase of infection. The lowest concentration of SARS-CoV-2 viral copies this assay can detect is 138 copies/mL. A negative result does not preclude SARS-Cov-2 infection and should not be used as the sole basis for treatment or other patient management decisions. A negative result may occur with  improper specimen collection/handling, submission of specimen other than nasopharyngeal swab, presence of viral mutation(s) within the areas targeted by this assay, and inadequate number of viral copies(<138 copies/mL). A negative result must be combined with clinical observations, patient history, and epidemiological information.  The expected result is Negative.  Fact Sheet for Patients:  EntrepreneurPulse.com.au  Fact Sheet for Healthcare Providers:  IncredibleEmployment.be  This test is no t yet approved or cleared by the [place] FDA and  has been authorized for detection and/or diagnosis of SARS-CoV-2 by FDA under an Emergency Use Authorization (EUA). This EUA will remain  in effect (meaning this test can be used) for the duration of the COVID-19 declaration under Section 564(b)(1) of the Act, 21 U.S.C.section 360bbb-3(b)(1), unless the authorization is terminated  or revoked sooner.       Influenza A by PCR NEGATIVE NEGATIVE Final   Influenza B by PCR NEGATIVE NEGATIVE Final    Comment: (NOTE) The Xpert Xpress SARS-CoV-2/FLU/RSV plus assay is intended as an aid in the diagnosis of influenza from Nasopharyngeal swab specimens and should not be used as a sole basis for treatment. Nasal washings and aspirates are unacceptable for Xpert Xpress SARS-CoV-2/FLU/RSV testing.  Fact Sheet for Patients: EntrepreneurPulse.com.au  Fact Sheet for Healthcare Providers: IncredibleEmployment.be  This test is not yet approved or cleared by the [place] FDA and has been authorized for detection and/or diagnosis of SARS-CoV-2 by FDA under an Emergency Use Authorization (EUA). This EUA will remain in effect (meaning this test can be used) for the duration of the COVID-19 declaration under Section 564(b)(1) of the Act, 21 U.S.C. section 360bbb-3(b)(1), unless the authorization is terminated or revoked.  Performed at [facility], [place] [address] [place], [place] [address]   Culture, blood (Routine X 2) w Reflex to ID Panel     Status: None (Preliminary result)   Collection Time: 07/27/20  1:40 PM   Specimen: BLOOD  Result Value Ref Range Status   Specimen Description   Final    BLOOD BLOOD RIGHT HAND Performed  at [facility] [address], [place], [place] [address]    Special Requests   Final    BOTTLES DRAWN AEROBIC AND ANAEROBIC Blood Culture results may not be optimal due to an inadequate volume of blood received in culture bottles Performed at [facility] [address] [place], [place] [address]    Culture   Final    NO GROWTH 3 DAYS Performed at [facility], [place]  [address] [place], [place] [address]    Report Status PENDING  Incomplete  Culture, blood (Routine X 2) w Reflex to ID Panel     Status: None (Preliminary result)   Collection Time: 07/27/20  2:05 PM   Specimen: BLOOD  Result Value Ref Range Status   Specimen Description   Final    BLOOD BLOOD LEFT HAND Performed at [facility] [address], [place], [place] [address]    Special Requests   Final    BOTTLES DRAWN AEROBIC ONLY Blood Culture adequate volume Performed at [facility] [address], [place], [place] [address]    Culture   Final    NO GROWTH 3 DAYS Performed at [facility], [place] [address], [place], [place] [address]    Report Status PENDING  Incomplete  MRSA Next Gen by PCR, Nasal     Status: None   Collection Time: 07/27/20  5:51 PM   Specimen: Nasal Mucosa; Nasal Swab  Result Value Ref Range Status   MRSA by PCR Next Gen NOT DETECTED NOT DETECTED Final    Comment: (NOTE) The GeneXpert MRSA Assay (FDA approved for NASAL specimens only), is one component of a comprehensive MRSA colonization surveillance program. It is not intended to diagnose MRSA infection nor to guide or monitor treatment for MRSA infections. Test performance is not FDA approved in patients less than 3 years old. Performed at [facility], [place] [address], [place], [place] [address]      Labs: BNP (last 3 results) No results for input(s): BNP in the last 8760 hours. Basic Metabolic Panel: Recent Labs  Lab  07/27/20 2213 07/28/20 0254 07/28/20 0631 07/28/20 1025 07/28/20 2021 07/29/20 0400 07/30/20 0518  NA 140 141 140  --   --  140 138  K 3.9 4.5 3.7  --   --  3.3* 3.6  CL 100 100 98  --   --  100 103  CO2 23 29 31   --   --  31 31  GLUCOSE 270* 152* 106*  --   --  170* 117*  BUN 59* 56* 50*  --   --  37* 21  CREATININE 2.18* 1.91* 1.87*  --   --  1.37* 0.97  CALCIUM 8.5* 8.5* 8.2*  --   --  8.0* 8.3*  MG  --  2.4  --  2.1 2.3 2.3 2.0   Liver Function Tests: Recent Labs  Lab 07/30/20 0518  AST 19  ALT 19  ALKPHOS 81  BILITOT 0.7  PROT 4.9*  ALBUMIN 2.5*   No results for input(s): LIPASE, AMYLASE in the last 168 hours. No results for input(s): AMMONIA in the last 168 hours. CBC: Recent Labs  Lab 07/27/20 1018 07/27/20 1029 07/27/20 1405 07/28/20 0254 07/29/20 0400 07/30/20 0518  WBC 23.7*  --  28.9* 27.0* 17.4* 12.0*  NEUTROABS 18.6*  --   --   --   --   --   HGB 12.5 14.3 12.4 12.4 10.8* 11.2*  HCT 42.1 42.0 42.9 37.7 32.4* 33.4*  MCV 97.2  --  100.2* 86.9 87.3 87.0  PLT 281  --  246 167 181 162   Cardiac Enzymes: No results for input(s): CKTOTAL, CKMB, CKMBINDEX, TROPONINI in the last 168 hours. BNP: Invalid input(s): POCBNP CBG: Recent Labs  Lab 07/29/20 1248 07/29/20 1724 07/29/20 2002 07/30/20 0425 07/30/20 0729  GLUCAP 335* 285* 281* 130* 90   D-Dimer No results for input(s):  DDIMER in the last 72 hours. Hgb A1c Recent Labs    07/27/20 1405  HGBA1C 12.9*   Lipid Profile No results for input(s): CHOL, HDL, LDLCALC, TRIG, CHOLHDL, LDLDIRECT in the last 72 hours. Thyroid function studies No results for input(s): TSH, T4TOTAL, T3FREE, THYROIDAB in the last 72 hours.  Invalid input(s): FREET3 Anemia work up No results for input(s): VITAMINB12, FOLATE, FERRITIN, TIBC, IRON, RETICCTPCT in the last 72 hours. Urinalysis    Component Value Date/Time   COLORURINE YELLOW 07/29/2020 1302   APPEARANCEUR CLEAR 07/29/2020 1302   LABSPEC 1.017  07/29/2020 1302   PHURINE 7.0 07/29/2020 1302   GLUCOSEU 150 (A) 07/29/2020 1302   HGBUR NEGATIVE 07/29/2020 1302   BILIRUBINUR NEGATIVE 07/29/2020 1302   KETONESUR 20 (A) 07/29/2020 1302   PROTEINUR NEGATIVE 07/29/2020 1302   UROBILINOGEN 0.2 04/09/2012 1648   NITRITE NEGATIVE 07/29/2020 1302   LEUKOCYTESUR NEGATIVE 07/29/2020 1302   Sepsis Labs Invalid input(s): PROCALCITONIN,  WBC,  LACTICIDVEN Microbiology Recent Results (from the past 240 hour(s))  Resp Panel by RT-PCR (Flu A&B, Covid) Nasopharyngeal Swab     Status: None   Collection Time: 07/27/20 11:09 AM   Specimen: Nasopharyngeal Swab; Nasopharyngeal(NP) swabs in vial transport medium  Result Value Ref Range Status   SARS Coronavirus 2 by RT PCR NEGATIVE NEGATIVE Final    Comment: (NOTE) SARS-CoV-2 target nucleic acids are NOT DETECTED.  The SARS-CoV-2 RNA is generally detectable in upper respiratory specimens during the acute phase of infection. The lowest concentration of SARS-CoV-2 viral copies this assay can detect is 138 copies/mL. A negative result does not preclude SARS-Cov-2 infection and should not be used as the sole basis for treatment or other patient management decisions. A negative result may occur with  improper specimen collection/handling, submission of specimen other than nasopharyngeal swab, presence of viral mutation(s) within the areas targeted by this assay, and inadequate number of viral copies(<138 copies/mL). A negative result must be combined with clinical observations, patient history, and epidemiological information. The expected result is Negative.  Fact Sheet for Patients:  EntrepreneurPulse.com.au  Fact Sheet for Healthcare Providers:  IncredibleEmployment.be  This test is no t yet approved or cleared by the [place] FDA and  has been authorized for detection and/or diagnosis of SARS-CoV-2 by FDA under an Emergency Use Authorization (EUA).  This EUA will remain  in effect (meaning this test can be used) for the duration of the COVID-19 declaration under Section 564(b)(1) of the Act, 21 U.S.C.section 360bbb-3(b)(1), unless the authorization is terminated  or revoked sooner.       Influenza A by PCR NEGATIVE NEGATIVE Final   Influenza B by PCR NEGATIVE NEGATIVE Final    Comment: (NOTE) The Xpert Xpress SARS-CoV-2/FLU/RSV plus assay is intended as an aid in the diagnosis of influenza from Nasopharyngeal swab specimens and should not be used as a sole basis for treatment. Nasal washings and aspirates are unacceptable for Xpert Xpress SARS-CoV-2/FLU/RSV testing.  Fact Sheet for Patients: EntrepreneurPulse.com.au  Fact Sheet for Healthcare Providers: IncredibleEmployment.be  This test is not yet approved or cleared by the [place] FDA and has been authorized for detection and/or diagnosis of SARS-CoV-2 by FDA under an Emergency Use Authorization (EUA). This EUA will remain in effect (meaning this test can be used) for the duration of the COVID-19 declaration under Section 564(b)(1) of the Act, 21 U.S.C. section 360bbb-3(b)(1), unless the authorization is terminated or revoked.  Performed at [facility], [place] [address], [place], [place] [address]  Culture, blood (Routine X 2) w Reflex to ID Panel     Status: None (Preliminary result)   Collection Time: 07/27/20  1:40 PM   Specimen: BLOOD  Result Value Ref Range Status   Specimen Description   Final    BLOOD BLOOD RIGHT HAND Performed at [facility] [address], [place], [place] [address]    Special Requests   Final    BOTTLES DRAWN AEROBIC AND ANAEROBIC Blood Culture results may not be optimal due to an inadequate volume of blood received in culture bottles Performed at [facility] [address], [place], [place] [address]    Culture   Final    NO GROWTH 3  DAYS Performed at [facility], [place] [address] [place], [place] [address]    Report Status PENDING  Incomplete  Culture, blood (Routine X 2) w Reflex to ID Panel     Status: None (Preliminary result)   Collection Time: 07/27/20  2:05 PM   Specimen: BLOOD  Result Value Ref Range Status   Specimen Description   Final    BLOOD BLOOD LEFT HAND Performed at [facility] [address] [place], [place] [address]    Special Requests   Final    BOTTLES DRAWN AEROBIC ONLY Blood Culture adequate volume Performed at [place] [facility] [address], [place], [place] [address]    Culture   Final    NO GROWTH 3 DAYS Performed at [facility], [place] [address], [place], [place] [address]    Report Status PENDING  Incomplete  MRSA Next Gen by PCR, Nasal     Status: None   Collection Time: 07/27/20  5:51 PM   Specimen: Nasal Mucosa; Nasal Swab  Result Value Ref Range Status   MRSA by PCR Next Gen NOT DETECTED NOT DETECTED Final    Comment: (NOTE) The GeneXpert MRSA Assay (FDA approved for NASAL specimens only), is one component of a comprehensive MRSA colonization surveillance program. It is not intended to diagnose MRSA infection nor to guide or monitor treatment for MRSA infections. Test performance is not FDA approved in patients less than 76 years old. Performed at [facility], [place] [address], [place],  [address]      Time coordinating discharge:  39 minutes  SIGNED:   Georgette Shell, MD  [facility] 07/30/2020, 11:50 AM

## 2020-07-31 ENCOUNTER — Encounter: Payer: Self-pay | Admitting: Student

## 2020-08-01 LAB — CULTURE, BLOOD (ROUTINE X 2)
Culture: NO GROWTH
Culture: NO GROWTH
Special Requests: ADEQUATE

## 2020-08-04 ENCOUNTER — Other Ambulatory Visit (HOSPITAL_COMMUNITY): Payer: Self-pay

## 2020-08-04 ENCOUNTER — Other Ambulatory Visit: Payer: Self-pay

## 2020-08-04 ENCOUNTER — Encounter: Payer: Self-pay | Admitting: Student

## 2020-08-04 ENCOUNTER — Ambulatory Visit (INDEPENDENT_AMBULATORY_CARE_PROVIDER_SITE_OTHER): Payer: Self-pay | Admitting: Student

## 2020-08-04 VITALS — BP 141/74 | HR 98 | Temp 98.6°F | Ht 63.0 in | Wt 170.8 lb

## 2020-08-04 DIAGNOSIS — E11649 Type 2 diabetes mellitus with hypoglycemia without coma: Secondary | ICD-10-CM

## 2020-08-04 DIAGNOSIS — E1165 Type 2 diabetes mellitus with hyperglycemia: Secondary | ICD-10-CM

## 2020-08-04 DIAGNOSIS — I152 Hypertension secondary to endocrine disorders: Secondary | ICD-10-CM

## 2020-08-04 DIAGNOSIS — E1159 Type 2 diabetes mellitus with other circulatory complications: Secondary | ICD-10-CM

## 2020-08-04 DIAGNOSIS — E08319 Diabetes mellitus due to underlying condition with unspecified diabetic retinopathy without macular edema: Secondary | ICD-10-CM

## 2020-08-04 LAB — CBC
HCT: 34.7 % — ABNORMAL LOW (ref 36.0–46.0)
Hemoglobin: 11.3 g/dL — ABNORMAL LOW (ref 12.0–15.0)
MCH: 28.8 pg (ref 26.0–34.0)
MCHC: 32.6 g/dL (ref 30.0–36.0)
MCV: 88.5 fL (ref 80.0–100.0)
Platelets: 302 10*3/uL (ref 150–400)
RBC: 3.92 MIL/uL (ref 3.87–5.11)
RDW: 14.7 % (ref 11.5–15.5)
WBC: 10.1 10*3/uL (ref 4.0–10.5)
nRBC: 0 % (ref 0.0–0.2)

## 2020-08-04 LAB — BASIC METABOLIC PANEL
Anion gap: 10 (ref 5–15)
BUN: 21 mg/dL (ref 8–23)
CO2: 24 mmol/L (ref 22–32)
Calcium: 8.9 mg/dL (ref 8.9–10.3)
Chloride: 98 mmol/L (ref 98–111)
Creatinine, Ser: 1.3 mg/dL — ABNORMAL HIGH (ref 0.44–1.00)
GFR, Estimated: 45 mL/min — ABNORMAL LOW (ref >60)
Glucose, Bld: 349 mg/dL — ABNORMAL HIGH (ref 70–99)
Potassium: 4.4 mmol/L (ref 3.5–5.1)
Sodium: 132 mmol/L — ABNORMAL LOW (ref 135–145)

## 2020-08-04 LAB — GLUCOSE, CAPILLARY
Glucose-Capillary: 399 mg/dL — ABNORMAL HIGH (ref 70–99)
Glucose-Capillary: 244 mg/dL — ABNORMAL HIGH (ref 70–99)

## 2020-08-04 MED ORDER — GLUCOSE BLOOD VI STRP
ORAL_STRIP | 12 refills | Status: AC
  Filled 2020-08-04: qty 75, 25d supply, fill #0
  Filled 2020-08-04: qty 100, fill #0

## 2020-08-04 MED ORDER — FREESTYLE LANCETS MISC
12 refills | Status: DC
  Filled 2020-08-04: qty 100, fill #0
  Filled 2020-08-04: qty 100, 30d supply, fill #0

## 2020-08-04 NOTE — Patient Instructions (Addendum)
Thank you, Mandy Mullen for allowing [name] to provide your care today. Today we discussed your diabetes and high blood pressure.    Diabetes - Your blood sugar today was 399, which is very high. We have provided you with a continuous glucose meter (CGM) to help check your sugars. Please continue your medication regimen of Novolog 70/30 20 units at 10 AM (before breakfast) and 6 PM (before dinner), and the sliding scale insulin 3 times a day after meals. We called the endocrinologist and let them know that you need to be seen soon. For your blurry vision, we will schedule you for a retinal scan here. We cannot refer you to ophthalmology until you get Medicare Part B, so please work on getting that and let us know when you do!  High blood pressure - Your blood pressure today was a little high, but when we checked orthostatic vitals, your blood pressure dropped when you stood up. We are going to hold off on re-starting your blood pressure medications right now as they may make you more dizzy. Please continue taking the rotuvastatin (Crestor 20mg ) for your cholesterol.   I have ordered the following labs for you:   Lab Orders  Glucose, capillary  BMP w Anion Gap (STAT/Sunquest-performed on-site)  CBC no Diff    Referrals ordered today:    Referral Orders  Amb Referral to Clinical Pharmacist    I have ordered the following medication/changed the following medications:   Stop the following medications: Medications Discontinued During This Encounter  Medication Reason   glucose blood (CONTOUR NEXT TEST) test strip    MICROLET LANCETS MISC    ONE TOUCH LANCETS MISC      Start the following medications: Meds ordered this encounter  Medications   glucose blood test strip    Sig: Use 1 strip to check blood glucose 3 times daily.    Dispense:  100 each    Refill:  12    IM program - testing 3 times daily per md via secured chat 7/19   Lancets (FREESTYLE) lancets    Sig: Use 1 lancet to  check blood glucose 3 times daily.    Dispense:  100 each    Refill:  12    IM program- testing 3 times daily per md via secured chat 7/19     Follow up:  1 week     Remember: Make sure you administer 20 units of Novolin 70/30, not 2!   Should you have any questions or concerns please call the internal medicine clinic at [phone].     [place]

## 2020-08-04 NOTE — Assessment & Plan Note (Addendum)
Extremely poor control. BG today is 399, rechecked at end of appointment and was 244. She was recently hospitalized for DKA for second time this year. States she was taking Novolin 70/30 30 units in the AM and 15 units in the PM prior to hospitalization. She states she is now taking Novolin 70/30 20 units BID and Novolog SSI. While hospitalized, a referral was sent to endocrinology and current status is under review. Endorses dizziness and blurred vision today.   Plan: Continue current regimen of Novolog 70/30 20 units BID and Novolog SSI after each meal. We called Endocrinology to stress the urgency of the referral, was told the message would be passed along to the physician. Also made an urgent pharmacy referral to help her review her medications, because at one point she stated she was taking 2 units of Novolog instead of 20. Also sent order for retinal scanner to be conducted here since she cannot be referred to ophthalmology due to lack of insurance.  Follow-up in 1 week.

## 2020-08-04 NOTE — Progress Notes (Signed)
This is a Careers information officer Note.  The care of the patient was discussed with Dr. Collene Gobble and the assessment and plan was formulated with their assistance.  Please see their note for official documentation of the patient encounter.     Subjective:     Patient ID: Mandy Mullen, female   DOB: 1950-08-30, 70 y.o.   MRN: [id number]  Mandy Mullen is a 70 year old female with diabetes, hypertension, and hyperlipidemia who presents for follow-up after recent hospitalization for DKA.   Mandy Mullen was hospitalized from 7/11-7/14 after presenting to the ED with confusion and altered mental status. This was her second hospitalization this year for DKA. Her blood glucose was found to be in the 800s and her beta hydroxybutyric acid was 15.9. She was given IV fluids and insulin, and she developed an acute kidney injury with creatinine of 2.2. She states she was taking her insulin as prescribed and denies any problems accessing her medications today. She states that her current insulin regimen is Novolin 70/30 20 units BID (10 AM and 6 PM) with sliding scale Novolog. She did not bring her medications or her glucometer today. She checked her blood sugar after breakfast this morning and it was in the 500s, so she administered 9 units of Novolog. She states she is scared to eat because she is worried about her blood sugar. She reports eating all vegetables for dinner last night but that her blood sugar was still elevated to the 370s. She endorses feeling woozy, dizzy/lightheaded and having blurred vision, all of which started right before her hospitalization and have improved since but are still present today. She also endorses polyuria and mild polydipsia, although states she only drinks about 5-6 bottles of small water bottles daily. She has not had an eye exam this year. She has not obtained Medicare Part B but states she will work on it this week. Her Losartan/HCTZ and amlodipine were stopped in the hospital.      Review of Systems  Constitutional:  Positive for fatigue. Negative for chills and fever.  HENT:  Negative for rhinorrhea and sore throat.   Eyes:  Positive for visual disturbance.  Respiratory:  Negative for chest tightness and shortness of breath.   Cardiovascular:  Negative for chest pain and leg swelling.  Gastrointestinal:  Negative for abdominal pain.  Endocrine: Positive for polydipsia and polyuria.  Genitourinary:  Positive for frequency.  Neurological:  Positive for dizziness and light-headedness.      Objective:    Vitals:   08/04/20 1316  BP: (!) 141/74  Pulse: 98  Temp: 98.6 F (37 C)  SpO2: 100%   Physical Exam Constitutional:      Appearance: Normal appearance.  HENT:     Head: Normocephalic and atraumatic.     Nose: Nose normal.  Eyes:     Conjunctiva/sclera: Conjunctivae normal.  Cardiovascular:     Rate and Rhythm: Normal rate.     Heart sounds: Murmur heard.     Comments: Systolic murmur heard in aortic area Pulmonary:     Effort: Pulmonary effort is normal.     Breath sounds: Normal breath sounds.  Abdominal:     General: Abdomen is flat. Bowel sounds are normal.     Palpations: Abdomen is soft.  Musculoskeletal:        General: Normal range of motion.     Cervical back: Normal range of motion.  Skin:    General: Skin is warm and dry.  Capillary Refill: Capillary refill takes 2 to 3 seconds.  Neurological:     Mental Status: She is alert and oriented to person, place, and time.       Assessment:     70 year old female who presents for hospital follow-up of DKA.     Plan:     Please see problem-based charting for assessment and plan.

## 2020-08-04 NOTE — Progress Notes (Signed)
Met with patient briefly at the request of the nurse. I confirmed that she does not have insurance that covers diabetes supplies or medications and that she can use the [facility] IM program. A new Contour Next One meter was supplied to patient today and she verbalized understanding of how to use it and to bring it to all visits here.  She says she is checking her blood sugar 4-5 times a day. Request prescription for Contour Next strips and lancets be sent to the [facility] with IM program in comments.  Debera Lat, RD 08/04/2020 2:22 PM.

## 2020-08-05 ENCOUNTER — Other Ambulatory Visit (HOSPITAL_COMMUNITY): Payer: Self-pay

## 2020-08-08 NOTE — Progress Notes (Signed)
Internal Medicine Clinic Attending ? ?Case discussed with Dr. Braswell  At the time of the visit.  We reviewed the resident?s history and exam and pertinent patient test results.  I agree with the assessment, diagnosis, and plan of care documented in the resident?s note.  ?

## 2020-08-11 ENCOUNTER — Encounter: Payer: Self-pay | Admitting: Internal Medicine

## 2020-08-11 ENCOUNTER — Telehealth: Payer: Self-pay

## 2020-08-11 NOTE — Telephone Encounter (Signed)
Phone call to patient about appointment today she thought it was tomorrow. The Patient will call back and reschedule her appointment [name], Mandy Mullen C7/26/20224:04 PM

## 2020-08-12 ENCOUNTER — Encounter: Payer: Self-pay | Admitting: Internal Medicine

## 2020-08-12 ENCOUNTER — Other Ambulatory Visit (HOSPITAL_COMMUNITY): Payer: Self-pay

## 2020-08-12 ENCOUNTER — Ambulatory Visit (INDEPENDENT_AMBULATORY_CARE_PROVIDER_SITE_OTHER): Payer: Self-pay | Admitting: Internal Medicine

## 2020-08-12 ENCOUNTER — Other Ambulatory Visit: Payer: Self-pay

## 2020-08-12 VITALS — BP 150/82 | HR 89 | Ht 63.0 in | Wt 164.8 lb

## 2020-08-12 DIAGNOSIS — E1165 Type 2 diabetes mellitus with hyperglycemia: Secondary | ICD-10-CM

## 2020-08-12 DIAGNOSIS — E785 Hyperlipidemia, unspecified: Secondary | ICD-10-CM

## 2020-08-12 MED ORDER — ROSUVASTATIN CALCIUM 20 MG PO TABS
20.0000 mg | ORAL_TABLET | Freq: Every day | ORAL | 3 refills | Status: DC
  Filled 2020-08-12: qty 30, 30d supply, fill #0

## 2020-08-12 NOTE — Patient Instructions (Signed)
-   STOP Novolin-N  - START Toujeo/ Basaglar  30 units ONCE daily  - Novolog/Humalog 6 units with each meal  - Novolog correctional insulin: ADD extra units on insulin to your meal-time Novolog dose if your blood sugars are higher than 180. Use the scale below to help guide you:   Blood sugar before meal Number of units to inject  Less than 180 0 unit  181 -  230 1 units  231 -  280 2 units  281 -  330 3 units  331 -  380 4 units  381 -  430 5 units  431 -  480 6 units  481 -  530 7 units  531 -  580 8 units      HOW TO TREAT LOW BLOOD SUGARS (Blood sugar LESS THAN 70 MG/DL) Please follow the RULE OF 15 for the treatment of hypoglycemia treatment (when your (blood sugars are less than 70 mg/dL)   STEP 1: Take 15 grams of carbohydrates when your blood sugar is low, which includes:  3-4 GLUCOSE TABS  OR 3-4 OZ OF JUICE OR REGULAR SODA OR ONE TUBE OF GLUCOSE GEL    STEP 2: RECHECK blood sugar in 15 MINUTES STEP 3: If your blood sugar is still low at the 15 minute recheck --> then, go back to STEP 1 and treat AGAIN with another 15 grams of carbohydrates.

## 2020-08-12 NOTE — Progress Notes (Signed)
Name: Mandy Mullen  MRN/ DOB: [id number], 1950/10/22   Age/ Sex: 70 y.o., female    PCP: Lucious Groves, DO   Reason for Endocrinology Evaluation: Type 2 Diabetes Mellitus     Date of Initial Endocrinology Visit: 08/12/2020     PATIENT IDENTIFIER: Mandy Mullen is a 70 y.o. female with a past medical history of T2DM and Dyslipidemia . The patient presented for initial endocrinology clinic visit on 08/12/2020 for consultative assistance with her diabetes management.    HPI: Ms. Goodbar was    Diagnosed with DM in 2008 Prior Medications tried/Intolerance: Metformin- rash , xultophy -cost ,  Jardiance , Januvia  Currently checking blood sugars 3 x / day Hypoglycemia episodes : yes               Symptoms: yes                 Frequency: 1/ week Hemoglobin A1c has ranged from 9.9% in 2011, peaking at >14.0% in 2021. Patient required assistance for hypoglycemia: no  Patient has required hospitalization within the last 1 year from hyper or hypoglycemia: DKA 07/2020  In terms of diet, the patient eats 3 meals  a day, snacks at night   Has part B Medicare only   Lives with son and grandson    HOME DIABETES REGIMEN: Novolin- N 20 units twice a day  Novolog  per ss   Statin: Has rosuvastatin on med list but she is not taking it  ACE-I/ARB: no Prior Diabetic Education:yes    METER DOWNLOAD SUMMARY: Unable to download     DIABETIC COMPLICATIONS: Microvascular complications:   Denies: neuropathy, CKD, retinopathy  Last eye exam: does not recall   Macrovascular complications:   Denies: CAD, PVD, CVA   PAST HISTORY: Past Medical History:  Past Medical History:  Diagnosis Date   History of blood transfusion 1991   "related to hysterectomy/left ovary OR" (11/28/2017)   History of herpes genitalis    Hyperlipidemia    Hypertension    MRSA (methicillin resistant Staphylococcus aureus)    Reccurent MRSA abscesses   Type II diabetes mellitus ([place])    Past  Surgical History:  Past Surgical History:  Procedure Laterality Date   ABDOMINAL HYSTERECTOMY  1991   "w/left ovary"   INCISION AND DRAINAGE ABSCESS ANAL  04/2004   LEFT OOPHORECTOMY Left 1991    Social History:  reports that she has never smoked. She has never used smokeless tobacco. She reports previous alcohol use. She reports that she does not use drugs. Family History:  Family History  Problem Relation Age of Onset   Coronary artery disease Father    Diabetes Sister    Stroke Sister      HOME MEDICATIONS: Allergies as of 08/12/2020       Reactions   Lisinopril Itching, Swelling, Rash   Angioedema and diffuse body rash   Bactrim [sulfamethoxazole-trimethoprim] Other (See Comments)   Erythroderma like reaction over 80% of her body    Eggs Or Egg-derived Products Nausea Only, Other (See Comments)   REACTION: Nausea. Chills/fevers x 6 months (after a flu shot in the 1990s).   Metformin And Related Rash   Shellfish Allergy Hives, Itching, Swelling, Rash        Medication List        Accurate as of August 12, 2020 11:57 AM. If you have any questions, ask your nurse or doctor.          ferrous  sulfate 325 (65 FE) MG tablet Take 325 mg by mouth daily.   freestyle lancets Use 1 lancet to check blood glucose 3 times daily.   glucose blood test strip Use 1 strip to check blood glucose 3 times daily.   insulin aspart 100 UNIT/ML FlexPen Commonly known as: NOVOLOG CBG 70 - 120: 0 units CBG 121 - 150: 1 unit CBG 151 - 200: 2 units CBG 201 - 250: 3 units CBG 251 - 300: 5 units CBG 301 - 350: 7 units CBG 351 - 400: 9 units   INSULIN SYRINGE .3CC/31GX5/16" 31G X 5/16" 0.3 ML Misc 1 Dose by Does not apply route 2 (two) times daily. IM program   NovoLIN 70/30 (70-30) 100 UNIT/ML injection Generic drug: insulin NPH-regular Human Inject 20 units into the skin 2 times daily   PEN NEEDLES 29GX1/2" 29G X 12MM Misc To use for novolog injection TIDAC.   rosuvastatin  20 MG tablet Commonly known as: Crestor Take 1 tablet (20 mg total) by mouth daily.   VITAMIN C PO Take 1 tablet by mouth daily.         ALLERGIES: Allergies  Allergen Reactions   Lisinopril Itching, Swelling and Rash    Angioedema and diffuse body rash   Bactrim [Sulfamethoxazole-Trimethoprim] Other (See Comments)    Erythroderma like reaction over 80% of her body    Eggs Or Egg-Derived Products Nausea Only and Other (See Comments)    REACTION: Nausea. Chills/fevers x 6 months (after a flu shot in the 1990s).   Metformin And Related Rash   Shellfish Allergy Hives, Itching, Swelling and Rash     REVIEW OF SYSTEMS: A comprehensive ROS was conducted with the patient and is negative except as per HPI and below:  Review of Systems  Gastrointestinal:  Negative for diarrhea and nausea.  Neurological:  Positive for dizziness.     OBJECTIVE:   VITAL SIGNS: BP (!) 150/82 (BP Location: Left Arm, Patient Position: Sitting, Cuff Size: Normal)   Pulse 89   Ht '5\' 3"'$  (1.6 m)   Wt 164 lb 12.8 oz (74.8 kg)   SpO2 97%   BMI 29.19 kg/m    PHYSICAL EXAM:  General: Pt appears well and is in NAD  Neck: General: Supple without adenopathy or carotid bruits. Thyroid: Thyroid size normal.  No goiter or nodules appreciated.   Lungs: Clear with good BS bilat with no rales, rhonchi, or wheezes  Heart: RRR   Abdomen: Normoactive bowel sounds, soft, nontender, without masses or organomegaly palpable  Extremities:  Lower extremities - No pretibial edema. No lesions.  Skin: Normal texture and temperature to palpation. No rash noted.  Neuro: MS is good with appropriate affect, pt is alert and Ox3    DM foot exam: 08/12/2020  The skin of the feet is intact without sores or ulcerations. The pedal pulses are 2+ on right and 2+ on left. The sensation is intact to a screening 5.07, 10 gram monofilament bilaterally   DATA REVIEWED:  Lab Results  Component Value Date   HGBA1C 12.9 (H)  07/27/2020   HGBA1C 13.6 (A) 03/12/2020   HGBA1C >14.0 (A) 07/25/2019   Lab Results  Component Value Date   MICROALBUR 0.61 06/25/2013   LDLCALC 164 (H) 03/12/2020   CREATININE 1.30 (H) 08/04/2020   Lab Results  Component Value Date   MICRALBCREAT 5.8 07/26/2016    Lab Results  Component Value Date   CHOL 248 (H) 03/12/2020   HDL 59 03/12/2020  LDLCALC 164 (H) 03/12/2020   TRIG 140 03/12/2020   CHOLHDL 4.2 03/12/2020        ASSESSMENT / PLAN / RECOMMENDATIONS:   1) Type 2 Diabetes Mellitus, poorly controlled, With out complications - Most recent A1c of 12.9%. Goal A1c <7.0%.    Plan: GENERAL: I have discussed with the patient the pathophysiology of diabetes. We went over the natural progression of the disease. We talked about both insulin resistance and insulin deficiency. We stressed the importance of lifestyle changes including diet and exercise. I explained the complications associated with diabetes including retinopathy, nephropathy, neuropathy as well as increased risk of cardiovascular disease. We went over the benefit seen with glycemic control.   I explained to the patient that diabetic patients are at higher than normal risk for amputations.  Major barrier to diabetes self-care there is lack of health insurance.  She is currently working on medication coverage In the meantime we are going to apply for patient assistance for Basaglar, Humalog, and Trulicity Patient with recurrent DKA, and I think in the future it is worth testing her for auto antibodies Today she was provided with Toujeo samples to place the Novolin and She will also be provided with a correction scale as well as standing dose of prandial insulin, she has NovoLog at home and she is not sure if she has qualify for patient assistance program for it or not. She was on GLP-1 agonist, she does not recall any intolerance issues, she does recall that it has worked for her Patient is NOT a candidate for SGLT2  inhibitors due to acidosis She is allergic to metformin  MEDICATIONS: STOP Novolin-N  START Toujeo/ Basaglar  30 units ONCE daily  Novolog/Humalog 6 units with each meal  CF: NovoLog (BG -130/40)  EDUCATION / INSTRUCTIONS: BG monitoring instructions: Patient is instructed to check her blood sugars 3 times a day, before meals. Call [facility] if: BG persistently < 70  I reviewed the Rule of 15 for the treatment of hypoglycemia in detail with the patient. Literature supplied.   2) Diabetic complications:  Eye: Does not have known diabetic retinopathy.  Patient urged to have an eye exam Neuro/ Feet: Does not have known diabetic peripheral neuropathy. Renal: Patient does not have known baseline CKD. She is not on an ACEI/ARB at present.  3)Dyslipidemia:  LDL above goal, she has rosuvastatin on her medication list but she is not sure if she is taking it or not.  We will refill     Continue atorvastatin 20 mg daily   Follow-up in 3 months Signed electronically by: Mack Guise, MD  [facility] End[facility]  [facility] [address] [place], [place] [address] Phone: [phone] FAX: [phone]   CC: Lucious Groves, DO [place]  [place] [address] Phone: [phone]  Fax: [phone]    Return to Endocrinology clinic as below: No future appointments.

## 2020-08-14 ENCOUNTER — Telehealth: Payer: Self-pay | Admitting: Internal Medicine

## 2020-08-14 ENCOUNTER — Other Ambulatory Visit (HOSPITAL_COMMUNITY): Payer: Self-pay

## 2020-08-14 ENCOUNTER — Other Ambulatory Visit: Payer: Self-pay | Admitting: Internal Medicine

## 2020-08-14 MED ORDER — INSULIN PEN NEEDLE 32G X 4 MM MISC
1.0000 | Freq: Four times a day (QID) | 3 refills | Status: DC
  Filled 2020-08-14: qty 100, 25d supply, fill #0

## 2020-08-14 MED ORDER — TOUJEO MAX SOLOSTAR 300 UNIT/ML ~~LOC~~ SOPN
30.0000 [IU] | PEN_INJECTOR | Freq: Every day | SUBCUTANEOUS | 4 refills | Status: DC
  Filled 2020-08-14: qty 3, 30d supply, fill #0

## 2020-08-14 MED ORDER — LANTUS SOLOSTAR 100 UNIT/ML ~~LOC~~ SOPN
30.0000 [IU] | PEN_INJECTOR | Freq: Every day | SUBCUTANEOUS | 11 refills | Status: DC
  Filled 2020-08-14: qty 15, 50d supply, fill #0
  Filled 2020-08-14: qty 9, 28d supply, fill #0

## 2020-08-14 NOTE — Telephone Encounter (Signed)
[facility] patient pharmacy is stated that the patient voiced that she is supposed to be starting on Toujeo. [facility] does not have a Prescription for this.

## 2020-08-17 ENCOUNTER — Telehealth: Payer: Self-pay

## 2020-08-17 ENCOUNTER — Other Ambulatory Visit (HOSPITAL_COMMUNITY): Payer: Self-pay

## 2020-08-17 DIAGNOSIS — E11649 Type 2 diabetes mellitus with hypoglycemia without coma: Secondary | ICD-10-CM

## 2020-08-17 NOTE — Telephone Encounter (Signed)
I thought part of the problem here is that she does have Medicare part A but has chosen not to sign up/ pay for part B/D, I have preveously been told that we cannot use IM Program if they do have Medicare (is that only if they have part B?)

## 2020-08-17 NOTE — Telephone Encounter (Signed)
Received TC from Audrea Muscat, pharmacist at [facility] who states patient was prescribed Lantus, 30 units daily by Dr. Kelton Pillar, Endocrinologist on 08/14/20.  Audrea Muscat states the patient is uninsured, Richardson Landry spoke with her and she informed Richardson Landry she is working, but there are not a lot of people employed at [employer], no insurance offered.  Richardson Landry approved 1st fill through IM program.  Pharmacy team wanting to know if you want to resend RX for the Lantus so she can continue to receive this under IM program.  Will forward to PCP to advise.  As of note, patient was a no-show at [facility] scheduled w/ Dr. Howie Ill on 08/11/20. SChaplin, RN,BSN

## 2020-08-18 ENCOUNTER — Other Ambulatory Visit (HOSPITAL_COMMUNITY): Payer: Self-pay

## 2020-08-18 ENCOUNTER — Telehealth: Payer: Self-pay | Admitting: *Deleted

## 2020-08-18 MED ORDER — INSULIN ASPART 100 UNIT/ML FLEXPEN
6.0000 [IU] | PEN_INJECTOR | Freq: Three times a day (TID) | SUBCUTANEOUS | 11 refills | Status: DC
  Filled 2020-08-18: qty 12, 28d supply, fill #0

## 2020-08-18 MED ORDER — LANTUS SOLOSTAR 100 UNIT/ML ~~LOC~~ SOPN
30.0000 [IU] | PEN_INJECTOR | Freq: Every day | SUBCUTANEOUS | 11 refills | Status: DC
  Filled 2020-08-18: qty 15, 30d supply, fill #0
  Filled 2020-10-02: qty 9, 30d supply, fill #0

## 2020-08-18 NOTE — Chronic Care Management (AMB) (Signed)
  Care Management   Outreach Note  08/18/2020 Name: Mandy Mullen MRN: [id number] DOB: 23-Feb-1950  Referred by: Lucious Groves, DO Reason for referral : Care Coordination (Initial outreach to schedule referral with BSW )   An unsuccessful telephone outreach was attempted today. The patient was referred to the case management team for assistance with care management and care coordination.   Follow Up Plan: The care management team will reach out to the patient again over the next 7 days.  If patient returns call to provider office, please advise to call [place]  at [phone].  [place] [facility] Management  Direct Dial: [phone]

## 2020-08-18 NOTE — Telephone Encounter (Signed)
Please advise 

## 2020-08-18 NOTE — Telephone Encounter (Signed)
That is my understanding as well.  However, Stanton Kidney from [facility] told me that Richardson Landry spoke with the patient and was told she has no insurance.  I asked how this could be since she is old enough to apply for medicare and was told by Stanton Kidney "I don't know".   Would you like to wait and discuss this at her next appt?  Thanks, [employer]

## 2020-08-18 NOTE — Telephone Encounter (Signed)
Resent Dr Quin Hoop Rx for Novolog and Lantus into [facility] with IM program.  Also, will place referral for CC management to assist her in signing up for Medicare B/D extra help for better long term access to medication/healthcare.

## 2020-08-21 NOTE — Telephone Encounter (Signed)
I have likely solved the problem at least for now with using the IM program but I agree she will likely continue to struggle.  I have placed a SW consult but will add pharmacy to Dr Georgina Peer to help out with ensuring she is able to get access to medications.

## 2020-08-21 NOTE — Chronic Care Management (AMB) (Signed)
  Care Management   Outreach Note  08/21/2020 Name: Mandy Mullen MRN: [id number] DOB: [birth year]  Referred by: Lucious Groves, DO Reason for referral : Care Coordination (Initial outreach to schedule referral with BSW )   A second unsuccessful telephone outreach was attempted today. The patient was referred to the case management team for assistance with care management and care coordination.   Follow Up Plan: The care management team will reach out to the patient again over the next 7-14 days.  If patient returns call to provider office, please advise to call [place] at [phone].  [place] Management  Direct Dial: [phone]

## 2020-08-24 ENCOUNTER — Other Ambulatory Visit (HOSPITAL_COMMUNITY): Payer: Self-pay

## 2020-08-24 ENCOUNTER — Other Ambulatory Visit: Payer: Self-pay

## 2020-08-24 ENCOUNTER — Encounter: Payer: Self-pay | Admitting: Internal Medicine

## 2020-08-24 ENCOUNTER — Ambulatory Visit (INDEPENDENT_AMBULATORY_CARE_PROVIDER_SITE_OTHER): Payer: Self-pay | Admitting: Internal Medicine

## 2020-08-24 ENCOUNTER — Ambulatory Visit: Payer: Self-pay | Admitting: Dietician

## 2020-08-24 ENCOUNTER — Encounter: Payer: Self-pay | Admitting: Dietician

## 2020-08-24 VITALS — BP 135/82 | HR 84 | Temp 98.9°F | Ht 63.0 in | Wt 166.7 lb

## 2020-08-24 DIAGNOSIS — E11649 Type 2 diabetes mellitus with hypoglycemia without coma: Secondary | ICD-10-CM

## 2020-08-24 DIAGNOSIS — Z794 Long term (current) use of insulin: Secondary | ICD-10-CM

## 2020-08-24 DIAGNOSIS — E785 Hyperlipidemia, unspecified: Secondary | ICD-10-CM

## 2020-08-24 DIAGNOSIS — E089 Diabetes mellitus due to underlying condition without complications: Secondary | ICD-10-CM

## 2020-08-24 MED ORDER — ROSUVASTATIN CALCIUM 20 MG PO TABS
20.0000 mg | ORAL_TABLET | Freq: Every day | ORAL | 3 refills | Status: DC
  Filled 2020-08-24: qty 30, 30d supply, fill #0
  Filled 2020-08-26 – 2020-10-13 (×2): qty 90, 90d supply, fill #0
  Filled 2020-10-13: qty 30, 30d supply, fill #0
  Filled 2020-11-18: qty 30, 30d supply, fill #1

## 2020-08-24 NOTE — Patient Instructions (Signed)
Mandy Mullen, it was a pleasure seeing you today!  Today we discussed: Diabetes medications- Please continue Toujeo/ Basaglar 30 units once a day and Novolog 6 units with meals.  It is important to take the Novolog 6 units about 30 minutes before you plan to eat your meal.  If you do not feel hungry than you can skip the with meals insulin.  We will get blood work at your next visit in October to see how your blood sugar is doing.    Cholesterol- Please start Crestor 20 mg.  I've sent that into the [facility].  We will get some blood work at your visit in October to see what level your cholesterol is at.  I have ordered the following labs today:  Lab Orders  No laboratory test(s) ordered today     Tests ordered today:  None  Referrals ordered today:   Referral Orders  No referral(s) requested today     I have ordered the following medication/changed the following medications:   Stop the following medications: There are no discontinued medications.   Start the following medications: No orders of the defined types were placed in this encounter.    Follow-up: 3 months   Please make sure to arrive 15 minutes prior to your next appointment. If you arrive late, you may be asked to reschedule.   We look forward to seeing you next time. Please call our clinic at [phone] if you have any questions or concerns. The best time to call is Monday-Friday from 9am-4pm, but there is someone available 24/7. If after hours or the weekend, call the main hospital number and ask for the Internal Medicine Resident On-Call. If you need medication refills, please notify your pharmacy one week in advance and they will send [name] a request.  Thank you for letting us take part in your care. Wishing you the best!  Thank you, Dr. Heloise [facility]

## 2020-08-24 NOTE — Progress Notes (Signed)
   CC: follow-up for uncontrolled type 2 diabetes mellitus  HPI:  [name] is a 70 y.o. with medical history as below presenting to [facility] for follow-up for uncontrolled type 2 diabetes mellitus.  Please see problem-based list for further details, assessments, and plans.  Past Medical History:  Diagnosis Date   History of blood transfusion 1991   "related to hysterectomy/left ovary OR" (11/28/2017)   History of herpes genitalis    Hyperlipidemia    Hypertension    MRSA (methicillin resistant Staphylococcus aureus)    Reccurent MRSA abscesses   Type II diabetes mellitus ([place])    Review of Systems:   Constitutional: Negative for chills, dizziness, malaise/fatigue and weight loss. Gastrointestinal: Negative for abdominal pain, constipation, diarrhea, nausea and vomiting. Psychiatric/Behavioral: The patient is not nervous/anxious and does not have insomnia.   Physical Exam:  Vitals:   08/24/20 1421 08/24/20 1427  BP: (!) 158/84 135/82  Pulse: 88 84  Temp: 98.9 F (37.2 C)   TempSrc: Oral   SpO2: 100%   Weight: 166 lb 11.2 oz (75.6 kg)   Height: '5\' 3"'$  (1.6 m)     General: Well-developed, well-nourished HENT: NCAT, no lesions noted Eyes: sclera non-icteric, conjunctiva clear CV: No murmurs, normal rate Pulm: CTAB, normal pulmonary effort GI:no tenderness present, normal bowel sounds MSK: No peripheral edema present, gait normal Skin: warm and dry Psych: normal mood and affect  Assessment & Plan:   See Encounters Tab for problem based charting.  Patient seen with Dr. Philipp Ovens

## 2020-08-24 NOTE — Progress Notes (Signed)
Unable to obtain adequate retinal images today. Patient agrees to a referral to an eye doctor

## 2020-08-25 NOTE — Assessment & Plan Note (Signed)
Patients states that she has not been taking her Crestor 20 mg.  ASCVD 29.3%. She has not has any side effects with the medication, but feels that she has had a lot going on medically and wasn't worried about the Crestor.  Refilled Crestor and educated patient on potential side effects.  -Repeat lipid panel in 3 months

## 2020-08-25 NOTE — Assessment & Plan Note (Addendum)
Medications:   Glucose-Capillary  Date/Time Value Ref Range Status  08/04/2020 03:43 PM 244 (H) 70 - 99 mg/dL Final    Comment:    Glucose reference range applies only to samples taken after fasting for at least 8 hours.    Last A1c was  Lab Results  Component Value Date   HGBA1C 12.9 (H) 07/27/2020  .   A/P: 1. Counseling: A. Patient recently went to endocrinology 7/27.  Her insulin regimen was changed to Glargine 30 units qd and Aspart 6 units AC.  She states that she had been injecting her AC insulin after eating.  We talked about the importance of injecting insulin about 30 minutes prior to eating.  She also had a couple of hypoglycemia episodes.  One of them was around 2am and her BG was 40.  When asked about this episode she said that she remembers that she took her AC insulin that evening, but did not have much of an appetite.  Cautioned patient that if she felt that she was not going to be able to eat much its ok to skip her AC insulin. Overall, she states that she feels much better with her BG lower.  She was having episodes of dizziness and felt unstable on her feet, that has improved.  She also said that her fatigue has improved with the improved BG control. Retinal/ Fundus photography completed today.  2. Medications: Glargine 30 units qd, Insulin Aspart 6 units TID before meals 3. Goals:  A. daily monitoring and recording of CBG B. CBG - 100-120  C. Hgb A1c: <7 D. Follow-up in 3 months

## 2020-08-26 ENCOUNTER — Other Ambulatory Visit (HOSPITAL_COMMUNITY): Payer: Self-pay

## 2020-08-28 NOTE — Progress Notes (Signed)
Internal Medicine Clinic Attending  I saw and evaluated the patient.  I personally confirmed the key portions of the history and exam documented by Dr. Masters and I reviewed pertinent patient test results.  The assessment, diagnosis, and plan were formulated together and I agree with the documentation in the resident's note.  

## 2020-09-01 NOTE — Addendum Note (Signed)
Addended by: Lucious Groves on: 09/01/2020 05:12 PM   Modules accepted: Orders

## 2020-09-02 NOTE — Chronic Care Management (AMB) (Signed)
  Care Management   Note  09/02/2020 Name: Mandy Mullen MRN: [id number] DOB: 07-Oct-1950  Mandy Mullen is a 70 y.o. year old female who is a primary care patient of Lucious Groves, DO. I reached out to Mandy Mullen by phone today in response to a referral sent by Mandy Mullen's Lucious Groves, DO.    Mandy Mullen was given information about care management services today including:  Care management services include personalized support from designated clinical staff supervised by her physician, including individualized plan of care and coordination with other care providers 24/7 contact phone numbers for assistance for urgent and routine care needs. The patient may stop care management services at any time by phone call to the office staff.  Patient agreed to services and verbal consent obtained.   Follow up plan: Telephone appointment with care management team member scheduled for:09/08/20  [place] Management  Direct Dial: [phone]

## 2020-09-08 ENCOUNTER — Ambulatory Visit: Payer: Self-pay | Admitting: Licensed Clinical Social Worker

## 2020-09-08 NOTE — Patient Instructions (Signed)
Visit Information  Instructions: patient will work with SW to address concerns related to financial constraints.  Patient was given the following information about care management and care coordination services today, agreed to services, and gave verbal consent: 1.care management/care coordination services include personalized support from designated clinical staff supervised by their physician, including individualized plan of care and coordination with other care providers 2. 24/7 contact phone numbers for assistance for urgent and routine care needs. 3. The patient may stop care management/care coordination services at any time by phone call to the office staff.  Patient verbalizes understanding of instructions provided today and agrees to view in [place].   The care management team will reach out to the patient again over the next 30 days.   [name], [place]  Social Worker [facility] Management  [phone]

## 2020-09-08 NOTE — Chronic Care Management (AMB) (Signed)
  Care Management   Social Work Visit Note  09/08/2020 Name: Mandy Mullen MRN: [id number] DOB: Oct 25, 1950  Mandy Mullen is a 70 y.o. year old female who sees Lucious Groves, DO for primary care. The care management team was consulted for assistance with care management and care coordination needs related to [facility] Resources    Patient was given the following information about care management and care coordination services today, agreed to services, and gave verbal consent: 1.care management/care coordination services include personalized support from designated clinical staff supervised by their physician, including individualized plan of care and coordination with other care providers 2. 24/7 contact phone numbers for assistance for urgent and routine care needs. 3. The patient may stop care management/care coordination services at any time by phone call to the office staff.  Engaged with patient by telephone for initial visit in response to provider referral for social work chronic care management and care coordination services.  Assessment: Review of patient history, allergies, and health status during evaluation of patient need for care management/care coordination services.    Interventions:  Patient interviewed and appropriate assessments performed Collaborated with clinical team regarding patient needs  SW completed SDOH screening. Patient declined needing assistance with; Food, transportation or housing.  Patient advised she is needing assistance with establishing payment plans for her medical bills. SW gave patient information to contact financial counseling through [facility].  SW will continue to outreach patient per patient request each month.  SDOH (Social Determinants of Health) assessments performed: Yes     Plan:  patient will work with BSW to address needs related to [employer]or will follow up with patient within 30 days.   [name],  [place]  Social Worker [facility] Management  [phone]

## 2020-09-16 ENCOUNTER — Encounter: Payer: Self-pay | Admitting: Pharmacist

## 2020-09-22 ENCOUNTER — Ambulatory Visit (INDEPENDENT_AMBULATORY_CARE_PROVIDER_SITE_OTHER): Payer: Self-pay | Admitting: Pharmacist

## 2020-09-22 DIAGNOSIS — E11649 Type 2 diabetes mellitus with hypoglycemia without coma: Secondary | ICD-10-CM

## 2020-09-22 NOTE — Patient Instructions (Signed)
Miss Legg it was a pleasure seeing you today.   Please do the following:  Increase Lantus from 30 units once daily in the morning to 36 units once daily in the morning as directed today during your appointment. Please make sure to take your Novolog 30 minutes BEFORE a meal. Continue to use your sliding scale. If you have any questions or if you believe something has occurred because of this change, call me or your doctor to let one of [name] know.  Continue checking blood sugars at home. It's really important that you record these and bring these in to your next doctor's appointment. Please check your blood sugars first thing in the morning in addition to the other times of day you have already been checking Continue making the lifestyle changes we've discussed together during our visit. Diet and exercise play a significant role in improving your blood sugars.  We are completing patient assistance paperwork to help you obtain your medications. If you are approved for patient assistance you will take Trulicity 0.'75mg'$  once weekly on the same day each week and continue to take your insulins as prescribed. The Basaglar will be replacing your Lantus and the Humalog will be replacing your Novolog. Please continue to take the insulin you already have until you receive the new ones in the mail. Follow-up with me in three to four weeks.   Hypoglycemia or low blood sugar:   Low blood sugar can happen quickly and may become an emergency if not treated right away.   While this shouldn't happen often, it can be brought upon if you skip a meal or do not eat enough. Also, if your insulin or other diabetes medications are dosed too high, this can cause your blood sugar to go to low.   Warning signs of low blood sugar include: Feeling shaky or dizzy Feeling weak or tired  Excessive hunger Feeling anxious or upset  Sweating even when you aren't exercising  What to do if I experience low blood sugar? Follow the  Rule of 15 Check your blood sugar with your meter. If lower than 70, proceed to step 2.  Treat with 15 grams of fast acting carbs which is found in 3-4 glucose tablets. If none are available you can try hard candy, 1 tablespoon of sugar or honey,4 ounces of fruit juice, or 6 ounces of REGULAR soda.  Re-check your sugar in 15 minutes. If it is still below 70, do what you did in step 2 again. If your blood sugar has come back up, go ahead and eat a snack or small meal made up of complex carbs (ex. Whole grains) and protein at this time to avoid recurrence of low blood sugar.

## 2020-09-22 NOTE — Progress Notes (Signed)
Subjective:    Patient ID: Mandy Mullen, female    DOB: May 24, 1950, 70 y.o.   MRN: [id number]  HPI Patient is a 70 y.o. female who presents for diabetes management. She is in good spirits and presents without assistance. Patient was referred on 08/21/20 and last seen by provider, Dr. Howie Ill, on 08/24/20.  Insurance coverage/medication affordability: No insurance, only Part A, getting insulin for $4 from [facility] through the IM program.  Current diabetes medications include: insulin aspart (Novolog) 6-14 units TID with meals - after eating (sliding scale - 6 units with meals plus additional above 180 CBG), insulin glargine (Lantus) 30 units daily in the morning. Current hyperlipidemia medications include: rosuvastatin 20 mg daily Patient states that She is taking her medications as prescribed. Patient reports adherence with medications. Patient states that she misses her medications nearly never.  Do you feel that your medications are working for you?  yes  Have you been experiencing any side effects to the medications prescribed? no  Do you have any problems obtaining medications due to transportation or finances?  So far getting meds from the outpatient pharmacy for cheap, but worries could become expensive since no ins   Patient reported dietary habits:  Eats 3 meals/day and 1-2 snacks/day; Boluses with 3 meals/day. Breakfast:oatmeal Lunch:bowl of soup, leftover dinner, salad Dinner:starch, meat, vegetable Prefers to eat meals at home (eat out 4x a month) Snacks:fruit (plum, watermelon, grapes, peaches) Drinks:water  Patient-reported exercise habits: walks every day, for about 10 minutes   Patient denies hypoglycemic events. No hypoglycemic events since discharged from hospital in beginning of August Patient denies polyuria (increased urination).  Patient reports polyphagia (increased appetite).  Patient denies polydipsia (increased thirst).  Patient denies  neuropathy (nerve pain). Patient reports visual changes. Left eye very blurry. Patient reports self foot exams.   Home fasting blood sugars: does not check 2 hour post-meal/random blood sugars: 200-500s  Objective:   Labs:   Physical Exam Constitutional:      Appearance: Normal appearance.  Neurological:     Mental Status: She is alert.  Psychiatric:        Mood and Affect: Mood normal.        Behavior: Behavior normal.    Review of Systems  All other systems reviewed and are negative.  Lab Results  Component Value Date   HGBA1C 12.9 (H) 07/27/2020   HGBA1C 13.6 (A) 03/12/2020   HGBA1C >14.0 (A) 07/25/2019    Lab Results  Component Value Date   MICRALBCREAT 5.8 07/26/2016    Lipid Panel     Component Value Date/Time   CHOL 248 (H) 03/12/2020 0948   TRIG 140 03/12/2020 0948   HDL 59 03/12/2020 0948   CHOLHDL 4.2 03/12/2020 0948   CHOLHDL 3.4 06/25/2013 1652   VLDL 26 06/25/2013 1652   LDLCALC 164 (H) 03/12/2020 0948    Assessment/Plan:   T2DM is not controlled likely due to lack of understanding on how to administer meal time insulin. Medication adherence appears optimal. Additional pharmacotherapy is recommended. Patient with a history of intolerance to metformin with blisters all over her body. Will apply for patient assistance for Trulicity (dulaglutide) 0.75 mg weekly. Patient educated on purpose, proper use and potential adverse effects of Trulicity (dulaglutide). Following instruction patient verbalized understanding of treatment plan.    Increase basal insulin Lantus (insulin glargine). Patient will now inject 36 units daily in the morning. Continued  rapid insulin Novolog (insulin aspart) at sliding scale doses. Patient  was instructed to inject Novolog (insulin aspart) 30 minutes before her meal, instead of after. Applying for patient assistance for GLP-1 Trulicity (generic name: dulglutide) at 0.75 mg weekly.  Also instructed to start checking blood  sugar in the morning right after waking up. Next A1C anticipated in October 2022.  Follow-up appointment 9/29 to review sugar readings. Written patient instructions provided.  This appointment required 42 minutes of direct patient care.  Thank you for involving pharmacy to assist in providing this patient's care.  Patient seen with Meyer Russel, Pharmacy Student.

## 2020-09-23 MED ORDER — INSULIN LISPRO 100 UNIT/ML IJ SOLN: INTRAMUSCULAR | 0 refills | Status: DC

## 2020-09-23 MED ORDER — TRULICITY 0.75 MG/0.5ML ~~LOC~~ SOAJ: 0.7500 mg | SUBCUTANEOUS | 0 refills | Status: DC

## 2020-09-23 MED ORDER — BASAGLAR KWIKPEN 100 UNIT/ML ~~LOC~~ SOPN: 36.0000 [IU] | PEN_INJECTOR | Freq: Every day | SUBCUTANEOUS | 0 refills | Status: DC

## 2020-09-23 NOTE — Assessment & Plan Note (Signed)
T2DM is not controlled likely due to lack of understanding on how to administer meal time insulin. Medication adherence appears optimal. Additional pharmacotherapy is recommended. Patient with a history of intolerance to metformin with blisters all over her body. Will apply for patient assistance for Trulicity (dulaglutide) 0.75 mg weekly. Patient educated on purpose, proper use and potential adverse effects of Trulicity (dulaglutide). Following instruction patient verbalized understanding of treatment plan.    1. Increase basal insulin Lantus (insulin glargine). Patient will now inject 36 units daily in the morning. 2. Continued  rapid insulin Novolog (insulin aspart) at sliding scale doses. Patient was instructed to inject Novolog (insulin aspart) 30 minutes before her meal, instead of after. 3. Applying for patient assistance for GLP-1 Trulicity (generic name: dulglutide) at 0.75 mg weekly.  4. Also instructed to start checking blood sugar in the morning right after waking up. 5. Next A1C anticipated in October 2022.

## 2020-09-23 NOTE — Progress Notes (Signed)
Submitted application for TRULICITY 0.75MG /0.5ML to [employer] for patient assistance.   Phone: [phone]

## 2020-09-24 NOTE — Progress Notes (Signed)
Received notification from [place] regarding approval for TRULICITY 0.'75MG'$ /0.5ML. Patient assistance approved from 09/23/20 to 09/23/21.  MEDICATION WILL SHIP TO PT'S HOME  Phone: [phone]

## 2020-10-02 ENCOUNTER — Other Ambulatory Visit (HOSPITAL_COMMUNITY): Payer: Self-pay

## 2020-10-13 ENCOUNTER — Other Ambulatory Visit (HOSPITAL_COMMUNITY): Payer: Self-pay

## 2020-10-14 ENCOUNTER — Ambulatory Visit: Payer: Self-pay | Admitting: Licensed Clinical Social Worker

## 2020-10-14 ENCOUNTER — Telehealth: Payer: Self-pay | Admitting: Licensed Clinical Social Worker

## 2020-10-14 NOTE — Chronic Care Management (AMB) (Signed)
  Care Management   Social Work Visit Note  10/14/2020 Name: Mandy Mullen MRN: [id number] DOB: 1950-11-06  Mandy Mullen is a 70 y.o. year old female who sees Lucious Groves, DO for primary care. The care management team was consulted for assistance with care management and care coordination needs related to [facility] Resources    Patient was given the following information about care management and care coordination services today, agreed to services, and gave verbal consent: 1.care management/care coordination services include personalized support from designated clinical staff supervised by their physician, including individualized plan of care and coordination with other care providers 2. 24/7 contact phone numbers for assistance for urgent and routine care needs. 3. The patient may stop care management/care coordination services at any time by phone call to the office staff.  Engaged with patient face to face for follow up visit in response to provider referral for social work chronic care management and care coordination services.  Assessment: Review of patient history, allergies, and health status during evaluation of patient need for care management/care coordination services.    Interventions:  Patient interviewed and appropriate assessments performed Collaborated with clinical team regarding patient needs  Patient came into clinic on today in error. SW spoke with patient face to face. Patient advised she is still working with financial assistance. Patient advised no other needs at this time.   SDOH (Social Determinants of Health) assessments performed: Yes     Plan:  patient will work with BSW to address needs related to Financial constraints  The Managed Medicaid care management team will reach out to the patient again over the next 30 days.   [name], [place]  Social Worker [facility] Management  [phone]

## 2020-10-14 NOTE — Telephone Encounter (Signed)
  Care Management   Follow Up Note   10/14/2020 Name: Mandy Mullen MRN: [id number] DOB: 07/27/1950   Referred by: Lucious Groves, DO Reason for referral : No chief complaint on file.   An unsuccessful telephone outreach was attempted today. The patient was referred to the case management team for assistance with care management and care coordination.   Follow Up Plan: The care management team will reach out to the patient again over the next 30 days.   [name], [place]  Social Worker [facility] Management  [phone]

## 2020-10-14 NOTE — Patient Instructions (Signed)
Visit Information  Instructions: patient will work with SW to address concerns related to financial assistance.  Patient was given the following information about care management and care coordination services today, agreed to services, and gave verbal consent: 1.care management/care coordination services include personalized support from designated clinical staff supervised by their physician, including individualized plan of care and coordination with other care providers 2. 24/7 contact phone numbers for assistance for urgent and routine care needs. 3. The patient may stop care management/care coordination services at any time by phone call to the office staff.  Patient verbalizes understanding of instructions provided today and agrees to view in [place].   The care management team will reach out to the patient again over the next 30 days.  [name], [place]  Social Worker [facility] Management  [phone]

## 2020-10-15 ENCOUNTER — Encounter: Payer: Self-pay | Admitting: Pharmacist

## 2020-11-09 ENCOUNTER — Ambulatory Visit: Payer: Self-pay | Attending: Family

## 2020-11-09 ENCOUNTER — Telehealth: Payer: Self-pay | Admitting: Pharmacist

## 2020-11-09 DIAGNOSIS — Z23 Encounter for immunization: Secondary | ICD-10-CM

## 2020-11-09 NOTE — Progress Notes (Signed)
   Covid-19 Vaccination Clinic  Name:  Mandy Mullen    MRN: [id number] DOB: 07-31-1950  11/09/2020  Mandy Mullen was observed post Covid-19 immunization for 15 minutes without incident. She was provided with Vaccine Information Sheet and instruction to access the V-Safe system.   Mandy Mullen was instructed to call 911 with any severe reactions post vaccine: Difficulty breathing  Swelling of face and throat  A fast heartbeat  A bad rash all over body  Dizziness and weakness   Immunizations Administered     Name Date Dose VIS Date Route   Moderna Covid-19 vaccine Bivalent Booster 11/09/2020  4:15 PM 0.25 mL 08/29/2020 Intramuscular   Manufacturer: Moderna   Lot: [id number]   [place]: [id number]

## 2020-11-12 ENCOUNTER — Encounter (INDEPENDENT_AMBULATORY_CARE_PROVIDER_SITE_OTHER): Payer: Self-pay | Admitting: Internal Medicine

## 2020-11-12 NOTE — Progress Notes (Deleted)
Name: Mandy Mullen  MRN/ DOB: [id number], [birth year]   Age/ Sex: 70 y.o., female    PCP: Gust Rung, DO   Reason for Endocrinology Evaluation: Type 2 Diabetes Mellitus     Date of Initial Endocrinology Visit: 7/272022    PATIENT IDENTIFIER: Mandy Mullen is a 70 y.o. female with a past medical history of T2DM and Dyslipidemia . The patient presented for initial endocrinology clinic visit on 08/12/2020 for consultative assistance with her diabetes management.    DIABETIC HISTORY:  Mandy Mullen  was diagnosed with DM in 2008, Metformin- rash , xultophy -cost ,  Jardiance , Januvia . Her  hemoglobin A1c has ranged from 9.9% in 2011, peaking at >14.0% in 2021.   She has a Hx of DKA in 07/2020 Lives with son and grandson   SUBJECTIVE:   During the last visit (08/12/2020): A1c 12.9% We switched her basal insulin and continued prandial   Today (11/12/2020): Mandy Mullen is here for a follow up on diabetes management. She  checks her blood sugars *** times daily, preprandial to breakfast and ***. The patient has *** had hypoglycemic episodes since the last clinic visit.  Has part B Medicare only   HOME DIABETES REGIMEN: Novolin- N 20 units twice a day  Novolog  per ss   Statin: Has rosuvastatin on med list but she is not taking it  ACE-I/ARB: no Prior Diabetic Education:yes    METER DOWNLOAD SUMMARY: Unable to download     DIABETIC COMPLICATIONS: Microvascular complications:   Denies: neuropathy, CKD, retinopathy  Last eye exam: does not recall   Macrovascular complications:   Denies: CAD, PVD, CVA   PAST HISTORY: Past Medical History:  Past Medical History:  Diagnosis Date   History of blood transfusion 1991   "related to hysterectomy/left ovary OR" (11/28/2017)   History of herpes genitalis    Hyperlipidemia    Hypertension    MRSA (methicillin resistant Staphylococcus aureus)    Reccurent MRSA abscesses   Type II diabetes mellitus (HCC)    Past  Surgical History:  Past Surgical History:  Procedure Laterality Date   ABDOMINAL HYSTERECTOMY  1991   "w/left ovary"   INCISION AND DRAINAGE ABSCESS ANAL  04/2004   LEFT OOPHORECTOMY Left 1991    Social History:  reports that she has never smoked. She has never used smokeless tobacco. She reports that she does not currently use alcohol. She reports that she does not use drugs. Family History:  Family History  Problem Relation Age of Onset   Coronary artery disease Father    Diabetes Sister    Stroke Sister      HOME MEDICATIONS: Allergies as of 11/12/2020       Reactions   Lisinopril Itching, Swelling, Rash   Angioedema and diffuse body rash   Bactrim [sulfamethoxazole-trimethoprim] Other (See Comments)   Erythroderma like reaction over 80% of her body    Eggs Or Egg-derived Products Nausea Only, Other (See Comments)   REACTION: Nausea. Chills/fevers x 6 months (after a flu shot in the 1990s).   Metformin And Related Rash   Shellfish Allergy Hives, Itching, Swelling, Rash        Medication List        Accurate as of November 12, 2020  7:30 AM. If you have any questions, ask your nurse or doctor.          ferrous sulfate 325 (65 FE) MG tablet Take 325 mg by mouth daily.   freestyle  lancets Use 1 lancet to check blood glucose 3 times daily.   glucose blood test strip Use 1 strip to check blood glucose 3 times daily.   insulin aspart 100 UNIT/ML FlexPen Commonly known as: NOVOLOG Inject 6-14 Units into the skin 3 (three) times daily with meals. Inject 6 units with meals plus additional sliding scale for CBG above 180.   insulin lispro 100 UNIT/ML injection Commonly known as: HumaLOG Use to inject via sliding scale. This is a patient assistance medication. Patient may not be approved and/or have medication. Please ask and verify when performing med review.   Insulin Pen Needle 32G X 4 MM Misc Use in the morning, at noon, in the evening, and at bedtime.    INSULIN SYRINGE .3CC/31GX5/16" 31G X 5/16" 0.3 ML Misc 1 Dose by Does not apply route 2 (two) times daily. IM program   Lantus SoloStar 100 UNIT/ML Solostar Pen Generic drug: insulin glargine Inject 30 Units into the skin daily.   Basaglar KwikPen 100 UNIT/ML Inject 36 Units into the skin daily. This is a patient assistance medication. Patient may not be approved and/or have medication. Please ask and verify when performing med review.   rosuvastatin 20 MG tablet Commonly known as: Crestor Take 1 tablet (20 mg total) by mouth daily.   Trulicity 0.75 MG/0.5ML Sopn Generic drug: Dulaglutide Inject 0.75 mg into the skin once a week. This is a patient assistance medication. Patient may not be approved and/or have medication. Please ask and verify when performing med review.   VITAMIN C PO Take 1 tablet by mouth daily.         ALLERGIES: Allergies  Allergen Reactions   Lisinopril Itching, Swelling and Rash    Angioedema and diffuse body rash   Bactrim [Sulfamethoxazole-Trimethoprim] Other (See Comments)    Erythroderma like reaction over 80% of her body    Eggs Or Egg-Derived Products Nausea Only and Other (See Comments)    REACTION: Nausea. Chills/fevers x 6 months (after a flu shot in the 1990s).   Metformin And Related Rash   Shellfish Allergy Hives, Itching, Swelling and Rash     REVIEW OF SYSTEMS: A comprehensive ROS was conducted with the patient and is negative except as per HPI and below:  Review of Systems  Gastrointestinal:  Negative for diarrhea and nausea.  Neurological:  Positive for dizziness.     OBJECTIVE:   VITAL SIGNS: There were no vitals taken for this visit.   PHYSICAL EXAM:  General: Pt appears well and is in NAD  Neck: General: Supple without adenopathy or carotid bruits. Thyroid: Thyroid size normal.  No goiter or nodules appreciated.   Lungs: Clear with good BS bilat with no rales, rhonchi, or wheezes  Heart: RRR   Abdomen: Normoactive  bowel sounds, soft, nontender, without masses or organomegaly palpable  Extremities:  Lower extremities - No pretibial edema. No lesions.  Skin: Normal texture and temperature to palpation. No rash noted.  Neuro: MS is good with appropriate affect, pt is alert and Ox3    DM foot exam: 08/12/2020  The skin of the feet is intact without sores or ulcerations. The pedal pulses are 2+ on right and 2+ on left. The sensation is intact to a screening 5.07, 10 gram monofilament bilaterally   DATA REVIEWED:  Lab Results  Component Value Date   HGBA1C 12.9 (H) 07/27/2020   HGBA1C 13.6 (A) 03/12/2020   HGBA1C >14.0 (A) 07/25/2019   Lab Results  Component Value Date  MICROALBUR 0.61 06/25/2013   LDLCALC 164 (H) 03/12/2020   CREATININE 1.30 (H) 08/04/2020   Lab Results  Component Value Date   MICRALBCREAT 5.8 07/26/2016    Lab Results  Component Value Date   CHOL 248 (H) 03/12/2020   HDL 59 03/12/2020   LDLCALC 164 (H) 03/12/2020   TRIG 140 03/12/2020   CHOLHDL 4.2 03/12/2020        ASSESSMENT / PLAN / RECOMMENDATIONS:   1) Type 2 Diabetes Mellitus, poorly controlled, With out complications - Most recent A1c of 12.9%. Goal A1c <7.0%.    Plan: GENERAL: I have discussed with the patient the pathophysiology of diabetes. We went over the natural progression of the disease. We talked about both insulin resistance and insulin deficiency. We stressed the importance of lifestyle changes including diet and exercise. I explained the complications associated with diabetes including retinopathy, nephropathy, neuropathy as well as increased risk of cardiovascular disease. We went over the benefit seen with glycemic control.   I explained to the patient that diabetic patients are at higher than normal risk for amputations.  Major barrier to diabetes self-care there is lack of health insurance.  She is currently working on medication coverage In the meantime we are going to apply for patient  assistance for Basaglar, Humalog, and Trulicity Patient with recurrent DKA, and I think in the future it is worth testing her for auto antibodies Today she was provided with Toujeo samples to place the Novolin and She will also be provided with a correction scale as well as standing dose of prandial insulin, she has NovoLog at home and she is not sure if she has qualify for patient assistance program for it or not. She was on GLP-1 agonist, she does not recall any intolerance issues, she does recall that it has worked for her Patient is NOT a candidate for SGLT2 inhibitors due to acidosis She is allergic to metformin  MEDICATIONS: STOP Novolin-N  START Toujeo/ Basaglar  30 units ONCE daily  Novolog/Humalog 6 units with each meal  CF: NovoLog (BG -130/40)  EDUCATION / INSTRUCTIONS: BG monitoring instructions: Patient is instructed to check her blood sugars 3 times a day, before meals. Call [facility] if: BG persistently < 70  I reviewed the Rule of 15 for the treatment of hypoglycemia in detail with the patient. Literature supplied.   2) Diabetic complications:  Eye: Does not have known diabetic retinopathy.  Patient urged to have an eye exam Neuro/ Feet: Does not have known diabetic peripheral neuropathy. Renal: Patient does not have known baseline CKD. She is not on an ACEI/ARB at present.  3)Dyslipidemia:  LDL above goal, she has rosuvastatin on her medication list but she is not sure if she is taking it or not.  We will refill     Continue atorvastatin 20 mg daily   Follow-up in 3 months Signed electronically by: Lyndle Herrlich, MD  [facility] Endocrin[facility]  [facility] [address] [place] [place], [place] [address] Phone: [phone] FAX: [phone]   CC: Gust Rung, DO [address] [place] [address] Phone: [phone]  Fax: [phone]    Return to Endocrinology clinic as below: Future Appointments   Date Time Provider Department Center  11/12/2020 10:50 AM Wesam Gearhart, Konrad Dolores, MD [facility] None  11/16/2020  2:00 PM [facility] SOCIAL WORK [facility]  11/30/2020 10:00 AM Cheral Almas, RPH-CPP [facility]

## 2020-11-13 ENCOUNTER — Inpatient Hospital Stay (HOSPITAL_COMMUNITY)
Admission: EM | Admit: 2020-11-13 | Discharge: 2020-11-16 | DRG: 638 | Disposition: A | Discharge status: Home / Self Care | Payer: Medicare Other | Attending: Family Medicine | Admitting: Family Medicine

## 2020-11-13 ENCOUNTER — Other Ambulatory Visit: Payer: Self-pay

## 2020-11-13 ENCOUNTER — Encounter (HOSPITAL_COMMUNITY): Payer: Self-pay | Admitting: Emergency Medicine

## 2020-11-13 DIAGNOSIS — E873 Alkalosis: Secondary | ICD-10-CM | POA: Diagnosis present

## 2020-11-13 DIAGNOSIS — N183 Chronic kidney disease, stage 3 unspecified: Secondary | ICD-10-CM | POA: Diagnosis present

## 2020-11-13 DIAGNOSIS — Z91012 Allergy to eggs: Secondary | ICD-10-CM | POA: Diagnosis not present

## 2020-11-13 DIAGNOSIS — E860 Dehydration: Secondary | ICD-10-CM | POA: Diagnosis present

## 2020-11-13 DIAGNOSIS — Z8614 Personal history of Methicillin resistant Staphylococcus aureus infection: Secondary | ICD-10-CM | POA: Diagnosis not present

## 2020-11-13 DIAGNOSIS — Z888 Allergy status to other drugs, medicaments and biological substances status: Secondary | ICD-10-CM | POA: Diagnosis not present

## 2020-11-13 DIAGNOSIS — N1831 Chronic kidney disease, stage 3a: Secondary | ICD-10-CM | POA: Diagnosis present

## 2020-11-13 DIAGNOSIS — E875 Hyperkalemia: Secondary | ICD-10-CM | POA: Diagnosis present

## 2020-11-13 DIAGNOSIS — E0810 Diabetes mellitus due to underlying condition with ketoacidosis without coma: Secondary | ICD-10-CM

## 2020-11-13 DIAGNOSIS — E1159 Type 2 diabetes mellitus with other circulatory complications: Secondary | ICD-10-CM | POA: Diagnosis present

## 2020-11-13 DIAGNOSIS — E1169 Type 2 diabetes mellitus with other specified complication: Secondary | ICD-10-CM | POA: Diagnosis present

## 2020-11-13 DIAGNOSIS — Z91013 Allergy to seafood: Secondary | ICD-10-CM

## 2020-11-13 DIAGNOSIS — Z794 Long term (current) use of insulin: Secondary | ICD-10-CM

## 2020-11-13 DIAGNOSIS — Z881 Allergy status to other antibiotic agents status: Secondary | ICD-10-CM | POA: Diagnosis not present

## 2020-11-13 DIAGNOSIS — Z79899 Other long term (current) drug therapy: Secondary | ICD-10-CM | POA: Diagnosis not present

## 2020-11-13 DIAGNOSIS — Z20822 Contact with and (suspected) exposure to covid-19: Secondary | ICD-10-CM | POA: Diagnosis present

## 2020-11-13 DIAGNOSIS — D509 Iron deficiency anemia, unspecified: Secondary | ICD-10-CM | POA: Diagnosis present

## 2020-11-13 DIAGNOSIS — N179 Acute kidney failure, unspecified: Secondary | ICD-10-CM | POA: Diagnosis present

## 2020-11-13 DIAGNOSIS — E1110 Type 2 diabetes mellitus with ketoacidosis without coma: Secondary | ICD-10-CM

## 2020-11-13 DIAGNOSIS — Z8249 Family history of ischemic heart disease and other diseases of the circulatory system: Secondary | ICD-10-CM | POA: Diagnosis not present

## 2020-11-13 DIAGNOSIS — Z7985 Long-term (current) use of injectable non-insulin antidiabetic drugs: Secondary | ICD-10-CM | POA: Diagnosis not present

## 2020-11-13 DIAGNOSIS — I152 Hypertension secondary to endocrine disorders: Secondary | ICD-10-CM | POA: Diagnosis present

## 2020-11-13 DIAGNOSIS — Z833 Family history of diabetes mellitus: Secondary | ICD-10-CM

## 2020-11-13 DIAGNOSIS — Z90721 Acquired absence of ovaries, unilateral: Secondary | ICD-10-CM

## 2020-11-13 DIAGNOSIS — E1122 Type 2 diabetes mellitus with diabetic chronic kidney disease: Secondary | ICD-10-CM | POA: Diagnosis present

## 2020-11-13 DIAGNOSIS — E785 Hyperlipidemia, unspecified: Secondary | ICD-10-CM | POA: Diagnosis present

## 2020-11-13 DIAGNOSIS — Z90710 Acquired absence of both cervix and uterus: Secondary | ICD-10-CM

## 2020-11-13 HISTORY — DX: Type 2 diabetes mellitus with ketoacidosis without coma: E11.10

## 2020-11-13 LAB — CBC WITH DIFFERENTIAL/PLATELET
Abs Immature Granulocytes: 0.6 10*3/uL — ABNORMAL HIGH (ref 0.00–0.07)
Basophils Absolute: 0 10*3/uL (ref 0.0–0.1)
Basophils Relative: 0 %
Eosinophils Absolute: 0 10*3/uL (ref 0.0–0.5)
Eosinophils Relative: 0 %
HCT: 34.2 % — ABNORMAL LOW (ref 36.0–46.0)
Hemoglobin: 10.9 g/dL — ABNORMAL LOW (ref 12.0–15.0)
Immature Granulocytes: 2 %
Lymphocytes Relative: 6 %
Lymphs Abs: 1.4 10*3/uL (ref 0.7–4.0)
MCH: 30 pg (ref 26.0–34.0)
MCHC: 31.9 g/dL (ref 30.0–36.0)
MCV: 94.2 fL (ref 80.0–100.0)
Monocytes Absolute: 1.4 10*3/uL — ABNORMAL HIGH (ref 0.1–1.0)
Monocytes Relative: 6 %
Neutro Abs: 21.2 10*3/uL — ABNORMAL HIGH (ref 1.7–7.7)
Neutrophils Relative %: 86 %
Platelets: 295 10*3/uL (ref 150–400)
RBC: 3.63 MIL/uL — ABNORMAL LOW (ref 3.87–5.11)
RDW: 13.5 % (ref 11.5–15.5)
WBC: 24.7 10*3/uL — ABNORMAL HIGH (ref 4.0–10.5)
nRBC: 0 % (ref 0.0–0.2)

## 2020-11-13 LAB — COMPREHENSIVE METABOLIC PANEL
ALT: 52 U/L — ABNORMAL HIGH (ref 0–44)
AST: 53 U/L — ABNORMAL HIGH (ref 15–41)
Albumin: 3.6 g/dL (ref 3.5–5.0)
Alkaline Phosphatase: 116 U/L (ref 38–126)
Anion gap: 30 — ABNORMAL HIGH (ref 5–15)
BUN: 104 mg/dL — ABNORMAL HIGH (ref 8–23)
CO2: 7 mmol/L — ABNORMAL LOW (ref 22–32)
Calcium: 7.5 mg/dL — ABNORMAL LOW (ref 8.9–10.3)
Chloride: 90 mmol/L — ABNORMAL LOW (ref 98–111)
Creatinine, Ser: 3.85 mg/dL — ABNORMAL HIGH (ref 0.44–1.00)
GFR, Estimated: 12 mL/min — ABNORMAL LOW (ref >60)
Glucose, Bld: 1046 mg/dL (ref 70–99)
Potassium: 6.7 mmol/L (ref 3.5–5.1)
Sodium: 127 mmol/L — ABNORMAL LOW (ref 135–145)
Total Bilirubin: 1.4 mg/dL — ABNORMAL HIGH (ref 0.3–1.2)
Total Protein: 6.6 g/dL (ref 6.5–8.1)

## 2020-11-13 LAB — CBG MONITORING, ED
Glucose-Capillary: >600 mg/dL (ref 70–99)
Glucose-Capillary: >600 mg/dL (ref 70–99)
Glucose-Capillary: >600 mg/dL (ref 70–99)

## 2020-11-13 LAB — RESP PANEL BY RT-PCR (FLU A&B, COVID) ARPGX2
Influenza A by PCR: NEGATIVE
Influenza B by PCR: NEGATIVE
SARS Coronavirus 2 by RT PCR: NEGATIVE

## 2020-11-13 LAB — I-STAT CHEM 8, ED
BUN: 106 mg/dL — ABNORMAL HIGH (ref 8–23)
Calcium, Ion: 0.88 mmol/L — CL (ref 1.15–1.40)
Chloride: 97 mmol/L — ABNORMAL LOW (ref 98–111)
Creatinine, Ser: 3.4 mg/dL — ABNORMAL HIGH (ref 0.44–1.00)
Glucose, Bld: >700 mg/dL (ref 70–99)
HCT: 36 % (ref 36.0–46.0)
Hemoglobin: 12.2 g/dL (ref 12.0–15.0)
Potassium: 6.5 mmol/L (ref 3.5–5.1)
Sodium: 125 mmol/L — ABNORMAL LOW (ref 135–145)
TCO2: 10 mmol/L — ABNORMAL LOW (ref 22–32)

## 2020-11-13 LAB — TYPE AND SCREEN
ABO/RH(D): A POS
Antibody Screen: NEGATIVE

## 2020-11-13 LAB — BLOOD GAS, VENOUS
Acid-base deficit: 21.4 mmol/L — ABNORMAL HIGH (ref 0.0–2.0)
Bicarbonate: 6.7 mmol/L — ABNORMAL LOW (ref 20.0–28.0)
O2 Saturation: 91.4 %
Patient temperature: 37
pCO2, Ven: 21.1 mmHg — ABNORMAL LOW (ref 44.0–60.0)
pH, Ven: 7.126 — CL (ref 7.250–7.430)
pO2, Ven: 80.4 mmHg — ABNORMAL HIGH (ref 32.0–45.0)

## 2020-11-13 LAB — BETA-HYDROXYBUTYRIC ACID: Beta-Hydroxybutyric Acid: 12.04 mmol/L — ABNORMAL HIGH (ref 0.05–0.27)

## 2020-11-13 LAB — GLUCOSE, CAPILLARY
Glucose-Capillary: >600 mg/dL (ref 70–99)
Glucose-Capillary: >600 mg/dL (ref 70–99)

## 2020-11-13 MED ORDER — INSULIN ASPART 100 UNIT/ML IV SOLN
10.0000 [IU] | Freq: Once | INTRAVENOUS | Status: AC
  Administered 2020-11-13: 10 [IU] via INTRAVENOUS
  Filled 2020-11-13: qty 0.1

## 2020-11-13 MED ORDER — SODIUM CHLORIDE 0.9 % IV BOLUS
1000.0000 mL | Freq: Once | INTRAVENOUS | Status: AC
  Administered 2020-11-13: 1000 mL via INTRAVENOUS

## 2020-11-13 MED ORDER — DEXTROSE-NACL 5-0.45 % IV SOLN: INTRAVENOUS | Status: DC

## 2020-11-13 MED ORDER — CALCIUM GLUCONATE-NACL 1-0.675 GM/50ML-% IV SOLN
1.0000 g | Freq: Once | INTRAVENOUS | Status: AC
  Administered 2020-11-13: 1000 mg via INTRAVENOUS
  Filled 2020-11-13: qty 50

## 2020-11-13 MED ORDER — INSULIN REGULAR(HUMAN) IN NACL 100-0.9 UT/100ML-% IV SOLN
INTRAVENOUS | Status: DC
  Administered 2020-11-13: 10 [IU]/h via INTRAVENOUS
  Administered 2020-11-14: 13 [IU]/h via INTRAVENOUS
  Filled 2020-11-13: qty 100

## 2020-11-13 MED ORDER — SODIUM CHLORIDE 0.9 % IV SOLN: INTRAVENOUS | Status: DC

## 2020-11-13 MED ORDER — DEXTROSE 50 % IV SOLN: 0.0000 mL | INTRAVENOUS | Status: DC | PRN

## 2020-11-13 MED ORDER — SODIUM CHLORIDE 0.9 % IV BOLUS: 20.0000 mL/kg | Freq: Once | INTRAVENOUS | Status: DC

## 2020-11-13 MED ORDER — SODIUM CHLORIDE 0.9 % IV BOLUS
1000.0000 mL | INTRAVENOUS | Status: AC
  Administered 2020-11-13: 1000 mL via INTRAVENOUS

## 2020-11-13 MED ORDER — DEXTROSE IN LACTATED RINGERS 5 % IV SOLN: INTRAVENOUS | Status: DC

## 2020-11-13 MED ORDER — SODIUM BICARBONATE 8.4 % IV SOLN
50.0000 meq | Freq: Once | INTRAVENOUS | Status: AC
  Administered 2020-11-13: 50 meq via INTRAVENOUS
  Filled 2020-11-13: qty 50

## 2020-11-13 MED ORDER — CHLORHEXIDINE GLUCONATE CLOTH 2 % EX PADS
6.0000 | MEDICATED_PAD | Freq: Every day | CUTANEOUS | Status: DC
  Administered 2020-11-14: 6 via TOPICAL

## 2020-11-13 MED ORDER — LACTATED RINGERS IV SOLN: INTRAVENOUS | Status: DC

## 2020-11-13 MED ORDER — SODIUM BICARBONATE 8.4 % IV SOLN: 50.0000 meq | Freq: Once | INTRAVENOUS | Status: DC

## 2020-11-13 MED ORDER — ENOXAPARIN SODIUM 30 MG/0.3ML IJ SOSY
30.0000 mg | PREFILLED_SYRINGE | Freq: Every day | INTRAMUSCULAR | Status: DC
  Administered 2020-11-14: 30 mg via SUBCUTANEOUS
  Filled 2020-11-13: qty 0.3

## 2020-11-13 MED ORDER — ORAL CARE MOUTH RINSE
15.0000 mL | Freq: Two times a day (BID) | OROMUCOSAL | Status: DC
  Administered 2020-11-14 (×2): 15 mL via OROMUCOSAL

## 2020-11-13 MED ORDER — INSULIN REGULAR(HUMAN) IN NACL 100-0.9 UT/100ML-% IV SOLN
INTRAVENOUS | Status: DC
  Filled 2020-11-13: qty 100

## 2020-11-13 MED ORDER — PANTOPRAZOLE SODIUM 40 MG IV SOLR
40.0000 mg | Freq: Once | INTRAVENOUS | Status: AC
  Administered 2020-11-13: 40 mg via INTRAVENOUS
  Filled 2020-11-13: qty 40

## 2020-11-13 NOTE — ED Triage Notes (Signed)
Pt BIB EMS from home c/o hyperglycemia, n/v x 1 week. Denies abdominal pain. Report normal Bms. Hx of dka. EMS reports a cbg of > 600

## 2020-11-13 NOTE — ED Provider Notes (Signed)
[facility] DEPT Provider Note   CSN: [id number] Arrival date & time: 11/13/20  1858     History Chief Complaint  Patient presents with   Hyperglycemia   Emesis    Mandy Mullen is a 70 y.o. female.  Patient states she has been vomiting for couple days and she has been vomiting up some black material.  Patient has a history of DKA  The history is provided by the patient and medical records. No language interpreter was used.  Hyperglycemia Severity:  Moderate Onset quality:  Sudden Duration:  2 days Timing:  Constant Progression:  Worsening Chronicity:  New Diabetes status:  Controlled with insulin Context: not change in medication   Relieved by:  Nothing Ineffective treatments:  None tried Associated symptoms: vomiting   Associated symptoms: no abdominal pain, no chest pain and no fatigue   Emesis Associated symptoms: no abdominal pain, no cough, no diarrhea and no headaches       Past Medical History:  Diagnosis Date   History of blood transfusion 1991   "related to hysterectomy/left ovary OR" (11/28/2017)   History of herpes genitalis    Hyperlipidemia    Hypertension    MRSA (methicillin resistant Staphylococcus aureus)    Reccurent MRSA abscesses   Type II diabetes mellitus ([place])     Patient Active Problem List   Diagnosis Date Noted   DKA, type 2 ([place]) 11/13/2020   Normocytic anemia 04/24/2019   Lipoma of head 12/28/2018   CKD stage 3 due to type 2 diabetes mellitus ([place]) 07/29/2016   Healthcare maintenance 02/26/2014   Hypertension associated with diabetes ([place]) 12/17/2007   Hyperlipidemia 01/13/2006   Uncontrolled type 2 diabetes mellitus 12/20/2005   TOTAL ABDOMINAL HYSTERECTOMY, HX OF 12/20/2005    Past Surgical History:  Procedure Laterality Date   ABDOMINAL HYSTERECTOMY  1991   "w/left ovary"   INCISION AND DRAINAGE ABSCESS ANAL  04/2004   LEFT OOPHORECTOMY Left 1991     OB History   No obstetric  history on file.     Family History  Problem Relation Age of Onset   Coronary artery disease Father    Diabetes Sister    Stroke Sister     Social History   Tobacco Use   Smoking status: Never   Smokeless tobacco: Never  Vaping Use   Vaping Use: Never used  Substance Use Topics   Alcohol use: Not Currently    Comment: 11/28/2017 "last drink was in 2006; never had problem w/alcohol".   Drug use: Never    Home Medications Prior to Admission medications   Medication Sig Start Date End Date Taking? Authorizing Provider  Ascorbic Acid (VITAMIN C PO) Take 1 tablet by mouth daily.    [provider]  Dulaglutide (TRULICITY) 2.42 PN/3.6RW SOPN Inject 0.75 mg into the skin once a week. This is a patient assistance medication. Patient may not be approved and/or have medication. Please ask and verify when performing med review. 09/23/20   Lucious Groves, DO  ferrous sulfate 325 (65 FE) MG tablet Take 325 mg by mouth daily.    [provider]  glucose blood test strip Use 1 strip to check blood glucose 3 times daily. 08/04/20   Sanjuan Dame, MD  insulin aspart (NOVOLOG) 100 UNIT/ML FlexPen Inject 6-14 Units into the skin 3 (three) times daily with meals. Inject 6 units with meals plus additional sliding scale for CBG above 180. 08/18/20   Lucious Groves, DO  Insulin Glargine (BASAGLAR KWIKPEN) 100 UNIT/ML Inject 36 Units into the skin daily. This is a patient assistance medication. Patient may not be approved and/or have medication. Please ask and verify when performing med review. 09/23/20   Lucious Groves, DO  insulin glargine (LANTUS SOLOSTAR) 100 UNIT/ML Solostar Pen Inject 30 Units into the skin daily. 08/18/20   Lucious Groves, DO  insulin lispro (HUMALOG) 100 UNIT/ML injection Use to inject via sliding scale. This is a patient assistance medication. Patient may not be approved and/or have medication. Please ask and verify when performing med review. 09/23/20   Lucious Groves, DO  Insulin Pen Needle 32G X 4 MM MISC Use in the morning, at noon, in the evening, and at bedtime. 08/14/20   Shamleffer, Melanie Crazier, MD  Insulin Syringe-Needle U-100 (INSULIN SYRINGE .3CC/31GX5/16") 31G X 5/16" 0.3 ML MISC 1 Dose by Does not apply route 2 (two) times daily. IM program 06/21/17   Ledell Noss, MD  Lancets (FREESTYLE) lancets Use 1 lancet to check blood glucose 3 times daily. 08/04/20   Sanjuan Dame, MD  rosuvastatin (CRESTOR) 20 MG tablet Take 1 tablet (20 mg total) by mouth daily. 08/24/20   Masters, Joellen Jersey, DO    Allergies    Lisinopril, Bactrim [sulfamethoxazole-trimethoprim], Eggs or egg-derived products, Metformin and related, and Shellfish allergy  Review of Systems   Review of Systems  Constitutional:  Negative for appetite change and fatigue.  HENT:  Negative for congestion, ear discharge and sinus pressure.   Eyes:  Negative for discharge.  Respiratory:  Negative for cough.   Cardiovascular:  Negative for chest pain.  Gastrointestinal:  Positive for vomiting. Negative for abdominal pain and diarrhea.  Genitourinary:  Negative for frequency and hematuria.  Musculoskeletal:  Negative for back pain.  Skin:  Negative for rash.  Neurological:  Negative for seizures and headaches.  Psychiatric/Behavioral:  Negative for hallucinations.    Physical Exam Updated Vital Signs BP (!) 117/54   Pulse 95   Temp 98.1 F (36.7 C) (Oral)   Resp 17   SpO2 99%   Physical Exam Vitals and nursing note reviewed.  Constitutional:      Appearance: She is well-developed.  HENT:     Head: Normocephalic.     Nose: Nose normal.  Eyes:     General: No scleral icterus.    Conjunctiva/sclera: Conjunctivae normal.  Neck:     Thyroid: No thyromegaly.  Cardiovascular:     Rate and Rhythm: Normal rate and regular rhythm.     Heart sounds: No murmur heard.   No friction rub. No gallop.  Pulmonary:     Breath sounds: No stridor. No wheezing or rales.  Chest:      Chest wall: No tenderness.  Abdominal:     General: There is no distension.     Tenderness: There is no abdominal tenderness. There is no rebound.  Musculoskeletal:        General: Normal range of motion.     Cervical back: Neck supple.  Lymphadenopathy:     Cervical: No cervical adenopathy.  Skin:    Findings: No erythema or rash.  Neurological:     Mental Status: She is alert and oriented to person, place, and time.     Motor: No abnormal muscle tone.     Coordination: Coordination normal.  Psychiatric:        Behavior: Behavior normal.    ED Results / Procedures / Treatments   Labs (all labs ordered  are listed, but only abnormal results are displayed) Labs Reviewed  CBC WITH DIFFERENTIAL/PLATELET - Abnormal; Notable for the following components:      Result Value   WBC 24.7 (*)    RBC 3.63 (*)    Hemoglobin 10.9 (*)    HCT 34.2 (*)    Neutro Abs 21.2 (*)    Monocytes Absolute 1.4 (*)    Abs Immature Granulocytes 0.60 (*)    All other components within normal limits  COMPREHENSIVE METABOLIC PANEL - Abnormal; Notable for the following components:   Sodium 127 (*)    Potassium 6.7 (*)    Chloride 90 (*)    CO2 7 (*)    Glucose, Bld 1,046 (*)    BUN 104 (*)    Creatinine, Ser 3.85 (*)    Calcium 7.5 (*)    AST 53 (*)    ALT 52 (*)    Total Bilirubin 1.4 (*)    GFR, Estimated 12 (*)    Anion gap 30 (*)    All other components within normal limits  BETA-HYDROXYBUTYRIC ACID - Abnormal; Notable for the following components:   Beta-Hydroxybutyric Acid 12.04 (*)    All other components within normal limits  BLOOD GAS, VENOUS - Abnormal; Notable for the following components:   pH, Ven 7.126 (*)    pCO2, Ven 21.1 (*)    pO2, Ven 80.4 (*)    Bicarbonate 6.7 (*)    Acid-base deficit 21.4 (*)    All other components within normal limits  I-STAT CHEM 8, ED - Abnormal; Notable for the following components:   Sodium 125 (*)    Potassium 6.5 (*)    Chloride 97 (*)    BUN  106 (*)    Creatinine, Ser 3.40 (*)    Glucose, Bld >700 (*)    Calcium, Ion 0.88 (*)    TCO2 10 (*)    All other components within normal limits  CBG MONITORING, ED - Abnormal; Notable for the following components:   Glucose-Capillary >600 (*)    All other components within normal limits  RESP PANEL BY RT-PCR (FLU A&B, COVID) ARPGX2  OCCULT BLOOD X 1 CARD TO LAB, STOOL  BETA-HYDROXYBUTYRIC ACID  URINALYSIS, ROUTINE W REFLEX MICROSCOPIC  BASIC METABOLIC PANEL  BASIC METABOLIC PANEL  BASIC METABOLIC PANEL  BASIC METABOLIC PANEL  HEMOGLOBIN A1C  CBG MONITORING, ED  TYPE AND SCREEN    EKG None  Radiology No results found.  Procedures Procedures   Medications Ordered in ED Medications  0.9 %  sodium chloride infusion ( Intravenous New Bag/Given 11/13/20 2019)  insulin regular, human (MYXREDLIN) 100 units/ 100 mL infusion (10 Units/hr Intravenous New Bag/Given 11/13/20 2046)  dextrose 50 % solution 0-50 mL (has no administration in time range)  sodium chloride 0.9 % bolus 20 mL/kg (has no administration in time range)  dextrose 5 %-0.45 % sodium chloride infusion (0 mLs Intravenous Hold 11/13/20 2044)  enoxaparin (LOVENOX) injection 40 mg (has no administration in time range)  sodium chloride 0.9 % bolus 1,000 mL (1,000 mLs Intravenous New Bag/Given (Non-Interop) 11/13/20 1942)  pantoprazole (PROTONIX) injection 40 mg (40 mg Intravenous Given 11/13/20 2050)  sodium chloride 0.9 % bolus 1,000 mL (1,000 mLs Intravenous New Bag/Given (Non-Interop) 11/13/20 2037)  insulin aspart (novoLOG) injection 10 Units (10 Units Intravenous Given 11/13/20 2055)  sodium bicarbonate injection 50 mEq (50 mEq Intravenous Given 11/13/20 2047)  calcium gluconate 1 g/ 50 mL sodium chloride IVPB (0 mg Intravenous Stopped 11/13/20  2111)    ED Course  I have reviewed the triage vital signs and the nursing notes.  Pertinent labs & imaging results that were available during my care of the patient  were reviewed by me and considered in my medical decision making (see chart for details). CRITICAL CARE Performed by: Milton Ferguson Total critical care time: 40 minutes Critical care time was exclusive of separately billable procedures and treating other patients. Critical care was necessary to treat or prevent imminent or life-threatening deterioration. Critical care was time spent personally by me on the following activities: development of treatment plan with patient and/or surrogate as well as nursing, discussions with consultants, evaluation of patient's response to treatment, examination of patient, obtaining history from patient or surrogate, ordering and performing treatments and interventions, ordering and review of laboratory studies, ordering and review of radiographic studies, pulse oximetry and re-evaluation of patient's condition.   CRITICAL CARE Performed by: Milton Ferguson Total critical care time: 40 minutes Critical care time was exclusive of separately billable procedures and treating other patients. Critical care was necessary to treat or prevent imminent or life-threatening deterioration. Critical care was time spent personally by me on the following activities: development of treatment plan with patient and/or surrogate as well as nursing, discussions with consultants, evaluation of patient's response to treatment, examination of patient, obtaining history from patient or surrogate, ordering and performing treatments and interventions, ordering and review of laboratory studies, ordering and review of radiographic studies, pulse oximetry and re-evaluation of patient's condition.  MDM Rules/Calculators/A&P                          Patient with DKA.  She is started on insulin drip and her hyperkalemia was treated.  She is given plenty of fluids will be admitted to medicine  Final Clinical Impression(s) / ED Diagnoses Final diagnoses:  Diabetic ketoacidosis without coma  associated with diabetes mellitus due to underlying condition [facility])    Rx / DC Orders ED Discharge Orders     None        Milton Ferguson, MD 11/13/20 2116

## 2020-11-13 NOTE — ED Notes (Signed)
Admitting MD [place] Crane at bedside

## 2020-11-13 NOTE — ED Notes (Signed)
ENDOTOOL recommends to remain at 10 units/hr. No changes to current insulin infusion at this time.

## 2020-11-13 NOTE — H&P (Addendum)
History and Physical  Mandy Mullen KXF:[id number] DOB: 1951-01-05 DOA: 11/13/2020  Referring physician: Dr. Roderic Palau, [place]  PCP: Lucious Groves, DO  Outpatient Specialists: Endocrinology. Patient coming from: Home.  Chief Complaint: Nausea and vomiting.  HPI: Mandy Mullen is a 70 y.o. female with medical history significant for uncontrolled type 2 diabetes recently started on Trulicity, essential hypertension, hyperlipidemia, history of DKA who presented to the [facility] ED from home via EMS due to nausea vomiting x1 week associated with hyperglycemia.  Denies running out of her insulin.  States that since she started taking Trulicity she has been feeling poorly with frequent nausea.  History is limited as the patient continually drifts to sleep during the interview.  Denies diarrhea or constipation.  Last bowel movement was yesterday, states it was black.  Admits to abdominal tenderness with palpation at epigastric region.  Upon EMS arrival to her home her CBG was greater than 600.  She was brought into the ED for further evaluation and management of her symptomatology.    In the ED lab studies were remarkable for serum glucose greater than 700, i-STAT bicarb 10, potassium 6.5, BUN 106, creatinine 3.40, sodium 125.  WBC 24.7, hemoglobin 10.9, MCV 94.  UA is pending, COVID-19 screening test is also pending.  Patient received 1 g of IV calcium gluconate x1 and 10 units IV insulin for hyperkalemia.  She also received 1 amp of sodium bicarb and 2 L IV fluid boluses normal saline.  TRH, hospitalist team, was asked to admit.  ED Course: Temperature 98.1.  BP 101/81, pulse 95, respiration rate 19, O2 saturation 99% on room air.  Lab studies are remarkable for serum sodium 127, potassium 6.7, serum bicarb 7, glucose 1046, BUN 104, creatinine 3.85, calcium 7.5, anion gap 30.  AST 53, ALT 52, total bilirubin 1.4.  Review of Systems: Review of systems as noted in the HPI. All other systems reviewed and are  negative.   Past Medical History:  Diagnosis Date   History of blood transfusion 1991   "related to hysterectomy/left ovary OR" (11/28/2017)   History of herpes genitalis    Hyperlipidemia    Hypertension    MRSA (methicillin resistant Staphylococcus aureus)    Reccurent MRSA abscesses   Type II diabetes mellitus ([place])    Past Surgical History:  Procedure Laterality Date   ABDOMINAL HYSTERECTOMY  1991   "w/left ovary"   INCISION AND DRAINAGE ABSCESS ANAL  04/2004   LEFT OOPHORECTOMY Left 1991    Social History:  reports that she has never smoked. She has never used smokeless tobacco. She reports that she does not currently use alcohol. She reports that she does not use drugs.   Allergies  Allergen Reactions   Lisinopril Itching, Swelling and Rash    Angioedema and diffuse body rash   Bactrim [Sulfamethoxazole-Trimethoprim] Other (See Comments)    Erythroderma like reaction over 80% of her body    Eggs Or Egg-Derived Products Nausea Only and Other (See Comments)    REACTION: Nausea. Chills/fevers x 6 months (after a flu shot in the 1990s).   Metformin And Related Rash   Shellfish Allergy Hives, Itching, Swelling and Rash    Family History  Problem Relation Age of Onset   Coronary artery disease Father    Diabetes Sister    Stroke Sister       Prior to Admission medications   Medication Sig Start Date End Date Taking? Authorizing Provider  Ascorbic Acid (VITAMIN C PO) Take  1 tablet by mouth daily.    [provider]  Dulaglutide (TRULICITY) 6.94 WN/4.6EV SOPN Inject 0.75 mg into the skin once a week. This is a patient assistance medication. Patient may not be approved and/or have medication. Please ask and verify when performing med review. 09/23/20   Lucious Groves, DO  ferrous sulfate 325 (65 FE) MG tablet Take 325 mg by mouth daily.    [provider]  glucose blood test strip Use 1 strip to check blood glucose 3 times daily. 08/04/20   Sanjuan Dame, MD  insulin aspart (NOVOLOG) 100 UNIT/ML FlexPen Inject 6-14 Units into the skin 3 (three) times daily with meals. Inject 6 units with meals plus additional sliding scale for CBG above 180. 08/18/20   [place]ber [place], Rachel Moulds, DO  Insulin Glargine (BASAGLAR KWIKPEN) 100 UNIT/ML Inject 36 Units into the skin daily. This is a patient assistance medication. Patient may not be approved and/or have medication. Please ask and verify when performing med review. 09/23/20   Lucious Groves, DO  insulin glargine (LANTUS SOLOSTAR) 100 UNIT/ML Solostar Pen Inject 30 Units into the skin daily. 08/18/20   Lucious Groves, DO  insulin lispro (HUMALOG) 100 UNIT/ML injection Use to inject via sliding scale. This is a patient assistance medication. Patient may not be approved and/or have medication. Please ask and verify when performing med review. 09/23/20   Lucious Groves, DO  Insulin Pen Needle 32G X 4 MM MISC Use in the morning, at noon, in the evening, and at bedtime. 08/14/20   Shamleffer, Melanie Crazier, MD  Insulin Syringe-Needle U-100 (INSULIN SYRINGE .3CC/31GX5/16") 31G X 5/16" 0.3 ML MISC 1 Dose by Does not apply route 2 (two) times daily. IM program 06/21/17   Ledell Noss, MD  Lancets (FREESTYLE) lancets Use 1 lancet to check blood glucose 3 times daily. 08/04/20   Sanjuan Dame, MD  rosuvastatin (CRESTOR) 20 MG tablet Take 1 tablet (20 mg total) by mouth daily. 08/24/20   Masters, Joellen [place], DO    Physical Exam: BP 101/81 (BP Location: Right Arm)   Pulse 95   Temp 98.1 F (36.7 C) (Oral)   Resp 19   SpO2 99%   General: 70 y.o. year-old female well developed well nourished in no acute distress.  Somnolent but easily arousable to voices and answers to questions appropriately.  Oriented x3. Cardiovascular: Regular rate and rhythm with no rubs or gallops.  No thyromegaly or JVD noted.  No lower extremity edema. 2/4 pulses in all 4 extremities. Respiratory: Clear to auscultation with no wheezes or rales. Good  inspiratory effort. Abdomen: Soft mild tenderness with palpation epigastric region.  Nondistended with normal bowel sounds x4 quadrants. Muskuloskeletal: No cyanosis, clubbing or edema noted bilaterally Neuro: CN II-XII intact, strength, sensation, reflexes Skin: No ulcerative lesions noted or rashes Psychiatry: Judgement and insight appear normal. Mood is appropriate for condition and setting          Labs on Admission:  Basic Metabolic Panel: Recent Labs  Lab 11/13/20 1948  NA 125*  K 6.5*  CL 97*  GLUCOSE >700*  BUN 106*  CREATININE 3.40*   Liver Function Tests: No results for input(s): AST, ALT, ALKPHOS, BILITOT, PROT, ALBUMIN in the last 168 hours. No results for input(s): LIPASE, AMYLASE in the last 168 hours. No results for input(s): AMMONIA in the last 168 hours. CBC: Recent Labs  Lab 11/13/20 1948 11/13/20 2002  WBC  --  24.7*  NEUTROABS  --  21.2*  HGB 12.2 10.9*  HCT 36.0 34.2*  MCV  --  94.2  PLT  --  295   Cardiac Enzymes: No results for input(s): CKTOTAL, CKMB, CKMBINDEX, TROPONINI in the last 168 hours.  BNP (last 3 results) No results for input(s): BNP in the last 8760 hours.  ProBNP (last 3 results) No results for input(s): PROBNP in the last 8760 hours.  CBG: Recent Labs  Lab 11/13/20 1939  GLUCAP >600*    Radiological Exams on Admission: No results found.  EKG: I independently viewed the EKG done and my findings are as followed: Sinus rhythm rate of 85, peaked T wave.  QTC 482.  Assessment/Plan Present on Admission:  DKA, type 2 ([place])  Active Problems:   DKA, type 2 ([place])  Severe DKA type II Presented with nausea and vomiting x1 week associated with hyperglycemia Bicarb on presentation i-STAT 10 Serum bicarb 7, anion gap 30. VBG pH 7.126, PCO2 21.1. Beta hydroxybutyrate acid is pending Started on insulin drip and IV fluid hydration in the ED, continue. Continue phase 1 DKA protocol BMP every 4 hours, beta hydroxybutyrate acid  every 8 hours. Obtain hemoglobin A1c and fasting lipid panel in the morning. Diabetes coordinator consult for patient's education  Severe hyperkalemia Presented with serum potassium of 6.5 Personally reviewed twelve-lead EKG with peaked T waves. Received 1 g IV calcium gluconate x1, IV insulin 10 units x 1, 1 amp of sodium bicarb. Continue IV fluid hydration NS, hold off lactated ringer to prevent worsening hyperkalemia. Follow-up repeat CMP Continue insulin drip  Severe metabolic acidosis in the setting of DKA Continue to treat underlying condition, DKA Watch serum bicarb and anion gap. Follow-up with a hydroxybutyric acid.  AKI, suspect prerenal from dehydration in the setting of DKA Baseline creatinine appears to be 0.9 with GFR greater than 60. Presented with creatinine of 3.40 Continue IV fluid hydration Closely monitor urine output with strict I's and O's Avoid nephrotoxic agents, dehydration and hypotension Continue to closely monitor renal function with BMPs  Leukocytosis, suspect reactive in the setting of DKA Follow UA to rule out other sources O2 saturation 99% on room air, afebrile, no respiratory symptoms.  Iron deficiency anemia Resume home ferrous sulfate. Hemoglobin 10.9, reported black stool, could be from ferrous sulfate however we will obtain an FOBT. Monitor H&H closely  Pseudohyponatremia Sodium 125 correction for hyperglycemia greater than 700, calculated 135  Essential hypertension BPs are currently soft Hold off home oral antihypertensives. Closely monitor vital signs  Hyperlipidemia Resume home Crestor Follow fasting lipid panel in the morning.   Critical care time: 65 minutes.    DVT prophylaxis: Subcu Lovenox daily  Code Status: Full code  Family Communication: None at bedside  Disposition Plan: Admitted to stepdown unit  Consults called: None  Admission status: Inpatient status.  Patient will require at least 2 midnights for  further evaluation and treatment of present condition.   Status is: Inpatient       Kayleen Memos MD [facility] Pager [phone]  If 7PM-7AM, please contact night-coverage [email or web address] Password [facility]  11/13/2020, 8:44 PM

## 2020-11-14 DIAGNOSIS — E1122 Type 2 diabetes mellitus with diabetic chronic kidney disease: Secondary | ICD-10-CM

## 2020-11-14 DIAGNOSIS — E785 Hyperlipidemia, unspecified: Secondary | ICD-10-CM

## 2020-11-14 DIAGNOSIS — N1831 Chronic kidney disease, stage 3a: Secondary | ICD-10-CM

## 2020-11-14 DIAGNOSIS — N179 Acute kidney failure, unspecified: Secondary | ICD-10-CM

## 2020-11-14 DIAGNOSIS — N183 Chronic kidney disease, stage 3 unspecified: Secondary | ICD-10-CM

## 2020-11-14 LAB — BASIC METABOLIC PANEL
Anion gap: 24 — ABNORMAL HIGH (ref 5–15)
Anion gap: 13 (ref 5–15)
Anion gap: 6 (ref 5–15)
BUN: 102 mg/dL — ABNORMAL HIGH (ref 8–23)
BUN: 98 mg/dL — ABNORMAL HIGH (ref 8–23)
BUN: 83 mg/dL — ABNORMAL HIGH (ref 8–23)
CO2: 9 mmol/L — ABNORMAL LOW (ref 22–32)
CO2: 17 mmol/L — ABNORMAL LOW (ref 22–32)
CO2: 22 mmol/L (ref 22–32)
Calcium: 6.7 mg/dL — ABNORMAL LOW (ref 8.9–10.3)
Calcium: 7.1 mg/dL — ABNORMAL LOW (ref 8.9–10.3)
Calcium: 7.3 mg/dL — ABNORMAL LOW (ref 8.9–10.3)
Chloride: 100 mmol/L (ref 98–111)
Chloride: 103 mmol/L (ref 98–111)
Chloride: 109 mmol/L (ref 98–111)
Creatinine, Ser: 3.9 mg/dL — ABNORMAL HIGH (ref 0.44–1.00)
Creatinine, Ser: 3.4 mg/dL — ABNORMAL HIGH (ref 0.44–1.00)
Creatinine, Ser: 2.6 mg/dL — ABNORMAL HIGH (ref 0.44–1.00)
GFR, Estimated: 12 mL/min — ABNORMAL LOW (ref >60)
GFR, Estimated: 14 mL/min — ABNORMAL LOW (ref >60)
GFR, Estimated: 19 mL/min — ABNORMAL LOW (ref >60)
Glucose, Bld: 786 mg/dL (ref 70–99)
Glucose, Bld: 541 mg/dL (ref 70–99)
Glucose, Bld: 268 mg/dL — ABNORMAL HIGH (ref 70–99)
Potassium: 4.3 mmol/L (ref 3.5–5.1)
Potassium: 4.4 mmol/L (ref 3.5–5.1)
Potassium: 3.8 mmol/L (ref 3.5–5.1)
Sodium: 133 mmol/L — ABNORMAL LOW (ref 135–145)
Sodium: 133 mmol/L — ABNORMAL LOW (ref 135–145)
Sodium: 137 mmol/L (ref 135–145)

## 2020-11-14 LAB — GLUCOSE, CAPILLARY
Glucose-Capillary: 144 mg/dL — ABNORMAL HIGH (ref 70–99)
Glucose-Capillary: 145 mg/dL — ABNORMAL HIGH (ref 70–99)
Glucose-Capillary: 149 mg/dL — ABNORMAL HIGH (ref 70–99)
Glucose-Capillary: 164 mg/dL — ABNORMAL HIGH (ref 70–99)
Glucose-Capillary: 170 mg/dL — ABNORMAL HIGH (ref 70–99)
Glucose-Capillary: 215 mg/dL — ABNORMAL HIGH (ref 70–99)
Glucose-Capillary: 246 mg/dL — ABNORMAL HIGH (ref 70–99)
Glucose-Capillary: 268 mg/dL — ABNORMAL HIGH (ref 70–99)
Glucose-Capillary: 340 mg/dL — ABNORMAL HIGH (ref 70–99)
Glucose-Capillary: 352 mg/dL — ABNORMAL HIGH (ref 70–99)
Glucose-Capillary: 435 mg/dL — ABNORMAL HIGH (ref 70–99)
Glucose-Capillary: 437 mg/dL — ABNORMAL HIGH (ref 70–99)
Glucose-Capillary: 458 mg/dL — ABNORMAL HIGH (ref 70–99)
Glucose-Capillary: 537 mg/dL (ref 70–99)
Glucose-Capillary: 540 mg/dL (ref 70–99)
Glucose-Capillary: 560 mg/dL (ref 70–99)
Glucose-Capillary: 584 mg/dL (ref 70–99)
Glucose-Capillary: 93 mg/dL (ref 70–99)
Glucose-Capillary: >600 mg/dL (ref 70–99)
Glucose-Capillary: >600 mg/dL (ref 70–99)

## 2020-11-14 LAB — BETA-HYDROXYBUTYRIC ACID
Beta-Hydroxybutyric Acid: 3.41 mmol/L — ABNORMAL HIGH (ref 0.05–0.27)
Beta-Hydroxybutyric Acid: 0.21 mmol/L (ref 0.05–0.27)

## 2020-11-14 LAB — ABO/RH: ABO/RH(D): A POS

## 2020-11-14 LAB — CBC
HCT: 28.8 % — ABNORMAL LOW (ref 36.0–46.0)
Hemoglobin: 9.8 g/dL — ABNORMAL LOW (ref 12.0–15.0)
MCH: 30.1 pg (ref 26.0–34.0)
MCHC: 34 g/dL (ref 30.0–36.0)
MCV: 88.3 fL (ref 80.0–100.0)
Platelets: 248 10*3/uL (ref 150–400)
RBC: 3.26 MIL/uL — ABNORMAL LOW (ref 3.87–5.11)
RDW: 13.3 % (ref 11.5–15.5)
WBC: 25.3 10*3/uL — ABNORMAL HIGH (ref 4.0–10.5)
nRBC: 0 % (ref 0.0–0.2)

## 2020-11-14 LAB — MAGNESIUM: Magnesium: 2.4 mg/dL (ref 1.7–2.4)

## 2020-11-14 LAB — PHOSPHORUS: Phosphorus: 3.5 mg/dL (ref 2.5–4.6)

## 2020-11-14 LAB — MRSA NEXT GEN BY PCR, NASAL: MRSA by PCR Next Gen: NOT DETECTED

## 2020-11-14 MED ORDER — SODIUM CHLORIDE 0.45 % IV SOLN: INTRAVENOUS | Status: DC

## 2020-11-14 MED ORDER — INSULIN ASPART 100 UNIT/ML IJ SOLN
4.0000 [IU] | Freq: Three times a day (TID) | INTRAMUSCULAR | Status: DC
  Administered 2020-11-14 – 2020-11-16 (×6): 4 [IU] via SUBCUTANEOUS

## 2020-11-14 MED ORDER — INSULIN ASPART 100 UNIT/ML IJ SOLN
0.0000 [IU] | Freq: Three times a day (TID) | INTRAMUSCULAR | Status: DC
  Administered 2020-11-14 – 2020-11-15 (×2): 2 [IU] via SUBCUTANEOUS
  Administered 2020-11-15 – 2020-11-16 (×2): 5 [IU] via SUBCUTANEOUS
  Administered 2020-11-16: 11 [IU] via SUBCUTANEOUS

## 2020-11-14 MED ORDER — INSULIN GLARGINE-YFGN 100 UNIT/ML ~~LOC~~ SOLN
30.0000 [IU] | Freq: Every day | SUBCUTANEOUS | Status: DC
  Administered 2020-11-14 – 2020-11-16 (×3): 30 [IU] via SUBCUTANEOUS
  Filled 2020-11-14 (×3): qty 0.3

## 2020-11-14 NOTE — Progress Notes (Signed)
Inpatient Diabetes Program Recommendations  AACE/ADA: New Consensus Statement on Inpatient Glycemic Control   Target Ranges:  Prepandial:   less than 140 mg/dL      Peak postprandial:   less than 180 mg/dL (1-2 hours)      Critically ill patients:  140 - 180 mg/dL   Results for Mandy Mullen, Mandy Mullen (MRN [id number]) as of 11/14/2020 09:20  Ref. Range 11/14/2020 00:54 11/14/2020 01:36 11/14/2020 02:29 11/14/2020 03:06 11/14/2020 03:37 11/14/2020 04:14 11/14/2020 04:45 11/14/2020 05:15 11/14/2020 06:15 11/14/2020 07:17 11/14/2020 08:22  Glucose-Capillary Latest Ref Range: 70 - 99 mg/dL >600 (HH) 540 (HH) 584 (HH) 537 (HH) 560 (HH) 435 (H) 458 (H) 352 (H) 437 (H) 340 (H) 268 (H)  Results for Mandy Mullen, Mandy Mullen (MRN [id number]) as of 11/14/2020 09:20  Ref. Range 11/13/2020 20:02  Glucose Latest Ref Range: 70 - 99 mg/dL 1,046 [facility])  Results for Mandy Mullen, Mandy Mullen (MRN [id number]) as of 11/14/2020 09:20  Ref. Range 11/13/2020 20:03  Beta-Hydroxybutyric Acid Latest Ref Range: 0.05 - 0.27 mmol/L 12.04 (H)   Review of Glycemic Control  Diabetes history: DM2 Outpatient Diabetes medications: Basaglar 36 units daily, Novolog 6 units TID with meals plus additional units for correction, Trulicity 6.25 mg Qweek Current orders for Inpatient glycemic control: IV insulin  Inpatient Diabetes Program Recommendations:    Insulin: IV insulin should be continued until acidosis is corrected as determined by MD (venous CO2 > 19 mmol/L, normal anion gap (8-12), negative ketones (beta-hydroxybutyric acid < 0.5 mmol/L)). Once acidosis is completely resolved and provider is ready to transition from IV to SQ insulin, please consider ordering Semglee 15 units Q24H (based on 74.9 kg x 0.2 units) and Novolog 0-15 units Q4H. Once diet resumed, may also need meal coverage insulin.  Outpatient: Would recommend Trulicity be discontinued as outpatient and have patient follow up with Dr. Kelton Pillar shortly after discharged.  NOTE:  Noted consult for Diabetes Coordinator. Diabetes Coordinator is not on campus over the weekend but available by pager from 8am to 5pm for questions or concerns. Chart reviewed. Patient was last inpatient 07/27/20-07/30/20 and inpatient diabetes coordinator spoke with patient on 07/28/20 during that admission. Noted patient sees Dr. Kelton Pillar for DM management and was last seen 08/12/20. Noted patient seen Hughes Better, RPh on 09/22/20 and was asked to increase Lantus/Basaglar to 36 units daily, continue Novolog 6 units TID with meals plus additional units for correction, and was prescribed Trulicity. Per note on 09/23/20 patient was approved for medication assistance program for Trulicity on 06/20/87. Patient admitted with severe DKA with initial glucose of 1046 mg/dl and started on IV insulin. IV insulin should be continued until acidosis is completely resolved. Would recommend Trulicity be discontinued as outpatient and have patient follow up with Dr. Kelton Pillar shortly after discharged. Will follow along while inpatient.   Thanks, Barnie Alderman, RN, MSN, CDE Diabetes Coordinator Inpatient Diabetes Program [phone] (Team Pager from 8am to 5pm)

## 2020-11-14 NOTE — Progress Notes (Addendum)
PROGRESS NOTE    Mandy Mullen  WUJ:[id number] DOB: 12/27/1950 DOA: 11/13/2020 PCP: Lucious Groves, DO   Brief Narrative: Mandy Mullen is a 70 y.o. female with a history of diabetes, hyperlipidemia, hypertension. Patient presented secondary to nausea and vomiting and found to have DKA. Insulin IV started.   Assessment & Plan:   Principal Problem:   DKA, type 2 ([place]) Active Problems:   Hyperlipidemia   Hypertension associated with diabetes ([place])   CKD stage 3 due to type 2 diabetes mellitus ([place])   Acute renal failure superimposed on stage 3a chronic kidney disease ([place])   DKA Patient met criteria for severe DKA on admission. On admission, ABG 7.13/21.1/80.4/6.7, glucose of 1046, anion gap of 30 and beta-hydroxybutyric acid of 12.04.  Patient given 3 L NS bolus and started on insulin IV. Glucose, bicarb and anion gap improving on IV insulin.  -Continue insulin IV with transition to Semglee 30 units, Novolog 4 units TID and SSI once gap is persistently closed -Continue IV fluids  Diabetes mellitus type 2 Most recent hemoglobin A1C of 12.9%. Repeat hemoglobin A1C is pending. Medication reconciliation not complete.  Hyperkalemia Likely falsely elevated in setting of DKA. Resolved with treatment of DKA.  Metabolic acidosis Associated compensatory respiratory alkalosis. Secondary to DKA.  AKI on CKD stage IIIa Secondary to dehydration from DKA. Baseline creatinine of 1.3. Creatinine of 3.40 on admission.  Leukocytosis No evidence of infection. Likely reactive in setting of severe DKA.  Pseudohyponatremia Secondary to severe hyperglycemia.  Iron deficiency anemia Patient is on iron supplementation as an outpatient. Stable.  Hyperlipidemia -Resume Crestor once diet is advanced   DVT prophylaxis: Lovenox Code Status:   Code Status: Full Code Family Communication: None at bedside Disposition Plan: Discharge home likely in 1-3 days pending stability off insulin  drip, PT/OT recommendations   Consultants:  None  Procedures:  None  Antimicrobials: None    Subjective: No issues overnight. Feels better. Unable to tell me her insulin regimen at home.  Objective: Vitals:   11/14/20 0200 11/14/20 0300 11/14/20 0336 11/14/20 0400  BP: (!) 101/48 (!) 101/41  (!) 120/45  Pulse: 92 93  94  Resp: '16 12  13  ' Temp:   98.9 F (37.2 C)   TempSrc:   Axillary   SpO2: 97% 96%  97%  Weight:      Height:       No intake or output data in the 24 hours ending 11/14/20 0606 Filed Weights   11/13/20 2236  Weight: 74.9 kg    Examination:  General exam: Appears calm and comfortable  Respiratory system: Clear to auscultation. Respiratory effort normal. Cardiovascular system: S1 & S2 heard, RRR. No murmurs, rubs, gallops or clicks. Gastrointestinal system: Abdomen is nondistended, soft and nontender. No organomegaly or masses felt. Normal bowel sounds heard. Central nervous system: Somnolent but arouses easily and oriented to person and place. No focal neurological deficits. Musculoskeletal: No edema. No calf tenderness Skin: No cyanosis. No rashes Psychiatry: Judgement and insight appear normal. Mood & affect appropriate.     Data Reviewed: I have personally reviewed following labs and imaging studies  CBC Lab Results  Component Value Date   WBC 25.3 (H) 11/14/2020   RBC 3.26 (L) 11/14/2020   HGB 9.8 (L) 11/14/2020   HCT 28.8 (L) 11/14/2020   MCV 88.3 11/14/2020   MCH 30.1 11/14/2020   PLT 248 11/14/2020   MCHC 34.0 11/14/2020   RDW 13.3 11/14/2020   LYMPHSABS  1.4 11/13/2020   MONOABS 1.4 (H) 11/13/2020   EOSABS 0.0 11/13/2020   BASOSABS 0.0 01/74/9449     Last metabolic panel Lab Results  Component Value Date   NA 133 (L) 11/14/2020   K 4.4 11/14/2020   CL 103 11/14/2020   CO2 17 (L) 11/14/2020   BUN 98 (H) 11/14/2020   CREATININE 3.40 (H) 11/14/2020   GLUCOSE 541 (HH) 11/14/2020   GFRNONAA 14 (L) 11/14/2020   GFRAA 51  (L) 03/12/2020   CALCIUM 7.1 (L) 11/14/2020   PHOS 3.5 11/14/2020   PROT 6.6 11/13/2020   ALBUMIN 3.6 11/13/2020   BILITOT 1.4 (H) 11/13/2020   ALKPHOS 116 11/13/2020   AST 53 (H) 11/13/2020   ALT 52 (H) 11/13/2020   ANIONGAP 13 11/14/2020    CBG (last 3)  Recent Labs    11/14/20 0414 11/14/20 0445 11/14/20 0515  GLUCAP 435* 458* 352*     GFR: Estimated Creatinine Clearance: 15.1 mL/min (A) (by C-G formula based on SCr of 3.4 mg/dL (H)).  Coagulation Profile: No results for input(s): INR, PROTIME in the last 168 hours.  Recent Results (from the past 240 hour(s))  Resp Panel by RT-PCR (Flu A&B, Covid) Nasopharyngeal Swab     Status: None   Collection Time: 11/13/20  8:27 PM   Specimen: Nasopharyngeal Swab; Nasopharyngeal(NP) swabs in vial transport medium  Result Value Ref Range Status   SARS Coronavirus 2 by RT PCR NEGATIVE NEGATIVE Final    Comment: (NOTE) SARS-CoV-2 target nucleic acids are NOT DETECTED.  The SARS-CoV-2 RNA is generally detectable in upper respiratory specimens during the acute phase of infection. The lowest concentration of SARS-CoV-2 viral copies this assay can detect is 138 copies/mL. A negative result does not preclude SARS-Cov-2 infection and should not be used as the sole basis for treatment or other patient management decisions. A negative result may occur with  improper specimen collection/handling, submission of specimen other than nasopharyngeal swab, presence of viral mutation(s) within the areas targeted by this assay, and inadequate number of viral copies(<138 copies/mL). A negative result must be combined with clinical observations, patient history, and epidemiological information. The expected result is Negative.  Fact Sheet for Patients:  EntrepreneurPulse.com.au  Fact Sheet for Healthcare Providers:  IncredibleEmployment.be  This test is no t yet approved or cleared by the [place] FDA  and  has been authorized for detection and/or diagnosis of SARS-CoV-2 by FDA under an Emergency Use Authorization (EUA). This EUA will remain  in effect (meaning this test can be used) for the duration of the COVID-19 declaration under Section 564(b)(1) of the Act, 21 U.S.C.section 360bbb-3(b)(1), unless the authorization is terminated  or revoked sooner.       Influenza A by PCR NEGATIVE NEGATIVE Final   Influenza B by PCR NEGATIVE NEGATIVE Final    Comment: (NOTE) The Xpert Xpress SARS-CoV-2/FLU/RSV plus assay is intended as an aid in the diagnosis of influenza from Nasopharyngeal swab specimens and should not be used as a sole basis for treatment. Nasal washings and aspirates are unacceptable for Xpert Xpress SARS-CoV-2/FLU/RSV testing.  Fact Sheet for Patients: EntrepreneurPulse.com.au  Fact Sheet for Healthcare Providers: IncredibleEmployment.be  This test is not yet approved or cleared by the [place] FDA and has been authorized for detection and/or diagnosis of SARS-CoV-2 by FDA under an Emergency Use Authorization (EUA). This EUA will remain in effect (meaning this test can be used) for the duration of the COVID-19 declaration under Section 564(b)(1) of the Act, 21  U.S.C. section 360bbb-3(b)(1), unless the authorization is terminated or revoked.  Performed at [facility], [place] [address], [place], [place] [address]   MRSA Next Gen by PCR, Nasal     Status: None   Collection Time: 11/13/20 10:31 PM   Specimen: Nasal Mucosa; Nasal Swab  Result Value Ref Range Status   MRSA by PCR Next Gen NOT DETECTED NOT DETECTED Final    Comment: (NOTE) The GeneXpert MRSA Assay (FDA approved for NASAL specimens only), is one component of a comprehensive MRSA colonization surveillance program. It is not intended to diagnose MRSA infection nor to guide or monitor treatment for MRSA infections. Test performance is not FDA  approved in patients less than 12 years old. Performed at [facility], [place] [address], [place], [place] [address]         Radiology Studies: No results found.      Scheduled Meds:  Chlorhexidine Gluconate Cloth  6 each Topical Daily   enoxaparin (LOVENOX) injection  30 mg Subcutaneous Daily   mouth rinse  15 mL Mouth Rinse BID   sodium bicarbonate  50 mEq Intravenous Once   Continuous Infusions:  sodium chloride 125 mL/hr at 11/13/20 2019   dextrose 5 % and 0.45% NaCl Stopped (11/13/20 2044)   insulin 13 Units/hr (11/14/20 0457)   sodium chloride       LOS: 1 day     Cordelia Poche, MD [facility] 11/14/2020, 6:06 AM  If 7PM-7AM, please contact night-coverage [email or web address]

## 2020-11-14 NOTE — Plan of Care (Signed)
  Problem: Education: Goal: Knowledge of General Education information will improve Description: Including pain rating scale, medication(s)/side effects and non-pharmacologic comfort measures Outcome: Progressing   Problem: Health Behavior/Discharge Planning: Goal: Ability to manage health-related needs will improve Outcome: Not Progressing   Problem: Clinical Measurements: Goal: Respiratory complications will improve Outcome: Progressing   Problem: Nutrition: Goal: Adequate nutrition will be maintained Outcome: Not Progressing   Problem: Pain Managment: Goal: General experience of comfort will improve Outcome: Progressing

## 2020-11-15 LAB — CBC
HCT: 30.6 % — ABNORMAL LOW (ref 36.0–46.0)
Hemoglobin: 10.6 g/dL — ABNORMAL LOW (ref 12.0–15.0)
MCH: 30.3 pg (ref 26.0–34.0)
MCHC: 34.6 g/dL (ref 30.0–36.0)
MCV: 87.4 fL (ref 80.0–100.0)
Platelets: 224 10*3/uL (ref 150–400)
RBC: 3.5 MIL/uL — ABNORMAL LOW (ref 3.87–5.11)
RDW: 13.7 % (ref 11.5–15.5)
WBC: 12.7 10*3/uL — ABNORMAL HIGH (ref 4.0–10.5)
nRBC: 0 % (ref 0.0–0.2)

## 2020-11-15 LAB — GLUCOSE, CAPILLARY
Glucose-Capillary: 193 mg/dL — ABNORMAL HIGH (ref 70–99)
Glucose-Capillary: 230 mg/dL — ABNORMAL HIGH (ref 70–99)
Glucose-Capillary: 147 mg/dL — ABNORMAL HIGH (ref 70–99)
Glucose-Capillary: 112 mg/dL — ABNORMAL HIGH (ref 70–99)
Glucose-Capillary: 76 mg/dL (ref 70–99)

## 2020-11-15 LAB — BASIC METABOLIC PANEL
Anion gap: 10 (ref 5–15)
BUN: 50 mg/dL — ABNORMAL HIGH (ref 8–23)
CO2: 22 mmol/L (ref 22–32)
Calcium: 7.7 mg/dL — ABNORMAL LOW (ref 8.9–10.3)
Chloride: 108 mmol/L (ref 98–111)
Creatinine, Ser: 1.25 mg/dL — ABNORMAL HIGH (ref 0.44–1.00)
GFR, Estimated: 47 mL/min — ABNORMAL LOW (ref >60)
Glucose, Bld: 189 mg/dL — ABNORMAL HIGH (ref 70–99)
Potassium: 3.8 mmol/L (ref 3.5–5.1)
Sodium: 140 mmol/L (ref 135–145)

## 2020-11-15 MED ORDER — ADULT MULTIVITAMIN W/MINERALS CH
1.0000 | ORAL_TABLET | Freq: Every day | ORAL | Status: DC
  Administered 2020-11-15 – 2020-11-16 (×2): 1 via ORAL
  Filled 2020-11-15 (×2): qty 1

## 2020-11-15 MED ORDER — CALCIUM CARBONATE ANTACID 500 MG PO CHEW
1.0000 | CHEWABLE_TABLET | Freq: Three times a day (TID) | ORAL | Status: DC
  Administered 2020-11-15 – 2020-11-16 (×4): 200 mg via ORAL
  Filled 2020-11-15 (×4): qty 1

## 2020-11-15 MED ORDER — ENOXAPARIN SODIUM 40 MG/0.4ML IJ SOSY
40.0000 mg | PREFILLED_SYRINGE | Freq: Every day | INTRAMUSCULAR | Status: DC
  Administered 2020-11-16: 40 mg via SUBCUTANEOUS
  Filled 2020-11-15: qty 0.4

## 2020-11-15 MED ORDER — ENSURE MAX PROTEIN PO LIQD
11.0000 [oz_av] | Freq: Every day | ORAL | Status: DC
  Administered 2020-11-15: 11 [oz_av] via ORAL
  Filled 2020-11-15 (×2): qty 330

## 2020-11-15 MED ORDER — GLUCERNA SHAKE PO LIQD
237.0000 mL | Freq: Two times a day (BID) | ORAL | Status: DC
  Administered 2020-11-15 – 2020-11-16 (×4): 237 mL via ORAL
  Filled 2020-11-15 (×4): qty 237

## 2020-11-15 MED ORDER — SODIUM CHLORIDE 0.45 % IV SOLN: INTRAVENOUS | Status: AC

## 2020-11-15 NOTE — Progress Notes (Signed)
PROGRESS NOTE    Mandy Mullen  TNB:[id number] DOB: 1950/08/30 DOA: 11/13/2020 PCP: Lucious Groves, DO   Brief Narrative: Mandy Mullen is a 70 y.o. female with a history of diabetes, hyperlipidemia, hypertension. Patient presented secondary to nausea and vomiting and found to have DKA. Insulin IV started.   Assessment & Plan:   Principal Problem:   DKA, type 2 (Mandy Mullen) Active Problems:   Hyperlipidemia   Hypertension associated with diabetes (Mandy Mullen)   CKD stage 3 due to type 2 diabetes mellitus (Mandy Mullen)   Acute renal failure superimposed on stage 3a chronic kidney disease (Mandy Mullen)   DKA Patient met criteria for severe DKA on admission. On admission, ABG 7.13/21.1/80.4/6.7, glucose of 1046, anion gap of 30 and beta-hydroxybutyric acid of 12.04.  Patient given 3 L NS bolus and started on insulin IV. Glucose, bicarb and anion gap improving on IV insulin.  -Continue Semglee 30 units, Novolog 4 units TID and SSI -Continue IV fluids and discontinue in a few hours once patient continues improved oral intake  Diabetes mellitus type 2 Most recent hemoglobin A1C of 12.9%. Repeat hemoglobin A1C is pending. Patient states she was taken off Lantus and started on Ozempic. Requesting to restart Lantus.  Hyperkalemia Likely falsely elevated in setting of DKA. Calcium gluconate given. Resolved with treatment of DKA.  Metabolic acidosis Associated compensatory respiratory alkalosis. Secondary to DKA.  AKI on CKD stage IIIa Secondary to dehydration from DKA. Baseline creatinine of 1.3. Creatinine of 3.40 on admission.  Leukocytosis No evidence of infection. Likely reactive in setting of severe DKA.  Pseudohyponatremia Secondary to severe hyperglycemia. Resolved.  Iron deficiency anemia Patient is on iron supplementation as an outpatient. Stable.  Hyperlipidemia Patient states she does not take this secondary to preference.  Hypocalcemia -Tums TID   DVT prophylaxis: Lovenox Code  Status:   Code Status: Full Code Family Communication: None at bedside Disposition Plan: Discharge home likely in 24 hours pending resolution of AKI, PT/OT recommendations. Transfer to medical floor.   Consultants:  None  Procedures:  None  Antimicrobials: None    Subjective: Much better today. No concerns overnight.  Objective: Vitals:   11/15/20 0400 11/15/20 0600 11/15/20 0700 11/15/20 0812  BP: (!) 135/58 (!) 126/50 (!) 125/55 (!) 148/80  Pulse: 91 86 89 90  Resp: '16 17 19 20  ' Temp:    99 F (37.2 C)  TempSrc:    Oral  SpO2: 96% 94% 96% 97%  Weight:      Height:        Intake/Output Summary (Last 24 hours) at 11/15/2020 0922 Last data filed at 11/15/2020 0755 Gross per 24 hour  Intake 2271.51 ml  Output 2050 ml  Net 221.51 ml   Filed Weights   11/13/20 2236  Weight: 74.9 kg    Examination:  General exam: Appears calm and comfortable Respiratory system: Clear to auscultation. Respiratory effort normal. Cardiovascular system: S1 & S2 heard, RRR. No murmurs, rubs, gallops or clicks. Gastrointestinal system: Abdomen is nondistended, soft and nontender. No organomegaly or masses felt. Normal bowel sounds heard. Central nervous system: Alert and oriented. No focal neurological deficits. Musculoskeletal: No edema. No calf tenderness Skin: No cyanosis. No rashes Psychiatry: Judgement and insight appear normal. Mood & affect appropriate.     Data Reviewed: I have personally reviewed following labs and imaging studies  CBC Lab Results  Component Value Date   WBC 12.7 (H) 11/15/2020   RBC 3.50 (L) 11/15/2020   HGB 10.6 (L) 11/15/2020  HCT 30.6 (L) 11/15/2020   MCV 87.4 11/15/2020   MCH 30.3 11/15/2020   PLT 224 11/15/2020   MCHC 34.6 11/15/2020   RDW 13.7 11/15/2020   LYMPHSABS 1.4 11/13/2020   MONOABS 1.4 (H) 11/13/2020   EOSABS 0.0 11/13/2020   BASOSABS 0.0 93/79/0240     Last metabolic panel Lab Results  Component Value Date   NA 140  11/15/2020   K 3.8 11/15/2020   CL 108 11/15/2020   CO2 22 11/15/2020   BUN 50 (H) 11/15/2020   CREATININE 1.25 (H) 11/15/2020   GLUCOSE 189 (H) 11/15/2020   GFRNONAA 47 (L) 11/15/2020   GFRAA 51 (L) 03/12/2020   CALCIUM 7.7 (L) 11/15/2020   PHOS 3.5 11/14/2020   PROT 6.6 11/13/2020   ALBUMIN 3.6 11/13/2020   BILITOT 1.4 (H) 11/13/2020   ALKPHOS 116 11/13/2020   AST 53 (H) 11/13/2020   ALT 52 (H) 11/13/2020   ANIONGAP 10 11/15/2020    CBG (last 3)  Recent Labs    11/14/20 2129 11/15/20 0511 11/15/20 0743  GLUCAP 149* 193* 230*      GFR: Estimated Creatinine Clearance: 41.2 mL/min (A) (by C-G formula based on SCr of 1.25 mg/dL (H)).  Coagulation Profile: No results for input(s): INR, PROTIME in the last 168 hours.  Recent Results (from the past 240 hour(s))  Resp Panel by RT-PCR (Flu A&B, Covid) Nasopharyngeal Swab     Status: None   Collection Time: 11/13/20  8:27 PM   Specimen: Nasopharyngeal Swab; Nasopharyngeal(NP) swabs in vial transport medium  Result Value Ref Range Status   SARS Coronavirus 2 by RT PCR NEGATIVE NEGATIVE Final    Comment: (NOTE) SARS-CoV-2 target nucleic acids are NOT DETECTED.  The SARS-CoV-2 RNA is generally detectable in upper respiratory specimens during the acute phase of infection. The lowest concentration of SARS-CoV-2 viral copies this assay can detect is 138 copies/mL. A negative result does not preclude SARS-Cov-2 infection and should not be used as the sole basis for treatment or other patient management decisions. A negative result may occur with  improper specimen collection/handling, submission of specimen other than nasopharyngeal swab, presence of viral mutation(s) within the areas targeted by this assay, and inadequate number of viral copies(<138 copies/mL). A negative result must be combined with clinical observations, patient history, and epidemiological information. The expected result is Negative.  Fact Sheet for  Patients:  EntrepreneurPulse.com.au  Fact Sheet for Healthcare Providers:  IncredibleEmployment.be  This test is no t yet approved or cleared by the [place] FDA and  has been authorized for detection and/or diagnosis of SARS-CoV-2 by FDA under an Emergency Use Authorization (EUA). This EUA will remain  in effect (meaning this test can be used) for the duration of the COVID-19 declaration under Section 564(b)(1) of the Act, 21 U.S.C.section 360bbb-3(b)(1), unless the authorization is terminated  or revoked sooner.       Influenza A by PCR NEGATIVE NEGATIVE Final   Influenza B by PCR NEGATIVE NEGATIVE Final    Comment: (NOTE) The Xpert Xpress SARS-CoV-2/FLU/RSV plus assay is intended as an aid in the diagnosis of influenza from Nasopharyngeal swab specimens and should not be used as a sole basis for treatment. Nasal washings and aspirates are unacceptable for Xpert Xpress SARS-CoV-2/FLU/RSV testing.  Fact Sheet for Patients: EntrepreneurPulse.com.au  Fact Sheet for Healthcare Providers: IncredibleEmployment.be  This test is not yet approved or cleared by the [place] FDA and has been authorized for detection and/or diagnosis of SARS-CoV-2 by FDA under  an Emergency Use Authorization (EUA). This EUA will remain in effect (meaning this test can be used) for the duration of the COVID-19 declaration under Section 564(b)(1) of the Act, 21 U.S.C. section 360bbb-3(b)(1), unless the authorization is terminated or revoked.  Performed at [facility], [place] [address], [place], [place] [address]   MRSA Next Gen by PCR, Nasal     Status: None   Collection Time: 11/13/20 10:31 PM   Specimen: Nasal Mucosa; Nasal Swab  Result Value Ref Range Status   MRSA by PCR Next Gen NOT DETECTED NOT DETECTED Final    Comment: (NOTE) The GeneXpert MRSA Assay (FDA approved for NASAL specimens only), is  one component of a comprehensive MRSA colonization surveillance program. It is not intended to diagnose MRSA infection nor to guide or monitor treatment for MRSA infections. Test performance is not FDA approved in patients less than 80 years old. Performed at [facility], [place] [address] [place], [place] [address]          Radiology Studies: No results found.      Scheduled Meds:  Chlorhexidine Gluconate Cloth  6 each Topical Daily   enoxaparin (LOVENOX) injection  30 mg Subcutaneous Daily   feeding supplement (GLUCERNA SHAKE)  237 mL Oral BID BM   insulin aspart  0-15 Units Subcutaneous TID WC   insulin aspart  4 Units Subcutaneous TID WC   insulin glargine-yfgn  30 Units Subcutaneous Daily   mouth rinse  15 mL Mouth Rinse BID   multivitamin with minerals  1 tablet Oral Daily   Ensure Max Protein  11 oz Oral QHS   sodium bicarbonate  50 mEq Intravenous Once   Continuous Infusions:  sodium chloride     insulin Stopped (11/14/20 1427)   sodium chloride       LOS: 2 days     Cordelia Poche, MD [facility] 11/15/2020, 9:22 AM  If 7PM-7AM, please contact night-coverage [email or web address]

## 2020-11-15 NOTE — Progress Notes (Signed)
Pt developed nose bleed while attempting to get up to chair w/ OT. C/o slight dizziness. Resolved after 5 minutes with pressure and head in neutral position. Pt resting in bed. VSS. MD Nettey aware, and d/c lovenox. Will continue to monitor.

## 2020-11-15 NOTE — Progress Notes (Signed)
Pt stable on arrival to floor. No needs at this time. Pt is weak overall with unsteady gait. Assessment performed. Rn will continue to monitor.

## 2020-11-15 NOTE — Discharge Instructions (Signed)

## 2020-11-15 NOTE — Progress Notes (Signed)
Initial Nutrition Assessment  DOCUMENTATION CODES:   Not applicable  INTERVENTION:   -MVI with minerals daily -Glucerna Shake po BID, each supplement provides 220 kcal and 10 grams of protein  -Ensure Max po daily, each supplement provides 150 kcal and 30 grams of protein -Provided "Carbohydrate Counting for People with Diabetes" and "Plate Method" handout from [facility]'s Nutrition Care Manual -Referral to [facility] [place] for further support and reinforcement  NUTRITION DIAGNOSIS:   Inadequate oral intake related to nausea, poor appetite as evidenced by meal completion < 50%.  GOAL:   Patient will meet greater than or equal to 90% of their needs  MONITOR:   PO intake, Supplement acceptance, Labs, Weight trends, Skin, I & O's  REASON FOR ASSESSMENT:   Malnutrition Screening Tool    ASSESSMENT:   Mandy Mullen is a 70 y.o. female with medical history significant for uncontrolled type 2 diabetes recently started on Trulicity, essential hypertension, hyperlipidemia, history of DKA who presented to the [facility] ED from home via EMS due to nausea vomiting x1 week associated with hyperglycemia.  Denies running out of her insulin.  States that since she started taking Trulicity she has been feeling poorly with frequent nausea.  History is limited as the patient continually drifts to sleep during the interview.  Denies diarrhea or constipation.  Last bowel movement was yesterday, states it was black.  Admits to abdominal tenderness with palpation at epigastric region.  Upon EMS arrival to her home her CBG was greater than 600.  She was brought into the ED for further evaluation and management of her symptomatology.  Pt admitted with DKA.   Reviewed I/O's: +566 ml x 24 hours and +1.8 L since admission  UOP: 1.9 L x 24 hours   Pt unavailable at time of visit. Attempted to speak with pt via call to hospital room phone, however, unable to reach. RD unable  to obtain further nutrition-related history or complete nutrition-focused physical exam at this time.    Per H&P, pt complains of nausea PTA since starting Truclity.   Pt with poor oral intake. Noted meal completions 10-50%.   Reviewed wt hx; wt has been stable over the past 3 months.  Medications reviewed and include 0.45% sodium chloride infusion @ 100 ml/hr.   Lab Results  Component Value Date   HGBA1C 12.9 (H) 07/27/2020   PTA DM medications are 0.75 mg duaglitide weekly, 6-14 units insulin aspart TID with meals, and 36 units insulin glargine daily.   Labs reviewed: CBGS: 93-230 (inpatient orders for glycemic control are 0-15 units insulin aspart TID with meals, 4 units insulin aspart TID with meals, and 30 units insulin glargine-yfgn daily).    Diet Order:   Diet Order             Diet Carb Modified Fluid consistency: Thin; Room service appropriate? Yes  Diet effective now                   EDUCATION NEEDS:   No education needs have been identified at this time  Skin:  Skin Assessment: Reviewed RN Assessment  Last BM:  11/12/20  Height:   Ht Readings from Last 1 Encounters:  11/13/20 5\' 3"  (1.6 m)    Weight:   Wt Readings from Last 1 Encounters:  11/13/20 74.9 kg    Ideal Body Weight:  52.3 kg  BMI:  Body mass index is 29.25 kg/m.  Estimated Nutritional Needs:   Kcal:  1550-1750  Protein:  80-95 grams  Fluid:  > 1.5 L    Loistine Chance, RD, LDN, [place] Registered Dietitian II Certified Diabetes Care and Education Specialist Please refer to [facility] for RD and/or RD on-call/weekend/after hours pager

## 2020-11-15 NOTE — Evaluation (Signed)
Occupational Therapy Evaluation Patient Details Name: Mandy Mullen MRN: [id number] DOB: 1950/10/23 Today's Date: 11/15/2020   History of Present Illness Patient is a 70 year old female who presented to the hosptial with nausea and vomitting. patient was found to have SKA. PMH: DM, hyperlipidemia, HTN, CKD   Clinical Impression   Patient is a 70 year old female who was admitted for above. Patient was living at home with family while caring for son per patient report. Patient was noted to have had a decline in ability to participate in ADLs. Patients decline was noted in decreased functional activity tolerance, decreased endurance, and decreased standing. Patient would continue to benefit from skilled OT services at this time while admitted and after d/c to address noted deficits in order to improve overall safety and independence in ADLs.        Recommendations for follow up therapy are one component of a multi-disciplinary discharge planning process, led by the attending physician.  Recommendations may be updated based on patient status, additional functional criteria and insurance authorization.   Follow Up Recommendations  Home health OT    Assistance Recommended at Discharge Frequent or constant Supervision/Assistance  Functional Status Assessment  Patient has had a recent decline in their functional status and demonstrates the ability to make significant improvements in function in a reasonable and predictable amount of time.  Equipment Recommendations  None recommended by OT    Recommendations for Other Services       Precautions / Restrictions Precautions Precautions: Fall Restrictions Weight Bearing Restrictions: No      Mobility Bed Mobility Overal bed mobility: Needs Assistance Bed Mobility: Supine to Sit     Supine to sit: Min assist;HOB elevated     General bed mobility comments: with increased time to complete task.    Transfers                           Balance Overall balance assessment: Mild deficits observed, not formally tested                                         ADL either performed or assessed with clinical judgement   ADL Overall ADL's : Needs assistance/impaired Eating/Feeding: Modified independent;Sitting Eating/Feeding Details (indicate cue type and reason): patient was approached after breakfast on this date with noted only few bites taken. patietn was educated on importance of eating a little of each meal for health while healing. patient verbalized understanding but declined to eat anymore at this time. nurse made aware. Grooming: Wash/dry face;Sitting;Set up   Upper Body Bathing: Minimal assistance;Sitting Upper Body Bathing Details (indicate cue type and reason): patient upon sitting up on edge of bed was noted to have nose bleed with no change in status. patients nurse was called and patients nose was noted to continue to bleed with pressure and over a few minutes. patient returned to supine in bed with mod A x 2 with patient reporting increased dizziness with transition back to supine. Lower Body Bathing: Moderate assistance;Sitting/lateral leans   Upper Body Dressing : Set up;Sitting   Lower Body Dressing: Moderate assistance;Sitting/lateral leans     Toilet Transfer Details (indicate cue type and reason): unable to assess on this date with onset of nosebleed Toileting- Clothing Manipulation and Hygiene: Sit to/from stand;Maximal assistance  Vision Patient Visual Report: No change from baseline       Perception     Praxis      Pertinent Vitals/Pain Pain Assessment: No/denies pain     Hand Dominance Right   Extremity/Trunk Assessment Upper Extremity Assessment Upper Extremity Assessment: Generalized weakness   Lower Extremity Assessment Lower Extremity Assessment: Defer to PT evaluation   Cervical / Trunk Assessment Cervical / Trunk Assessment:  Normal   Communication Communication Communication: No difficulties   Cognition Arousal/Alertness: Awake/alert Behavior During Therapy: WFL for tasks assessed/performed Overall Cognitive Status: Within Functional Limits for tasks assessed                                       General Comments       Exercises     Shoulder Instructions      Home Living Family/patient expects to be discharged to:: Private residence Living Arrangements: Children;Other relatives Available Help at Discharge: Family Type of Home: House Home Access: Stairs to enter [employer] of Steps: 5 Entrance Stairs-Rails: Can reach both Home Layout: One level     Bathroom Shower/Tub: Teacher, early years/pre: Standard         Additional Comments: Pt lives with son and grandson. 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 Pt's grandson works Lobbyist day. Per pt, she has cousins who may provide assist at home if needed.      Prior Functioning/Environment Prior Level of Function : Independent/Modified Independent                        OT Problem List: Decreased strength;Decreased activity tolerance;Impaired balance (sitting and/or standing);Decreased safety awareness;Decreased knowledge of use of DME or AE      OT Treatment/Interventions: Self-care/ADL training;Therapeutic exercise;Neuromuscular education;Energy conservation;DME and/or AE instruction;Therapeutic activities;Balance training;Patient/family education    OT Goals(Current goals can be found in the care plan section) Acute Rehab OT Goals Patient Stated Goal: to get better OT Goal Formulation: With patient Time For Goal Achievement: 11/29/20 Potential to Achieve Goals: Good  OT Frequency: Min 2X/week   Barriers to D/C:    patient cares for son at home with help of other family       Co-evaluation              AM-PAC OT "6 Clicks" Daily Activity     Outcome Measure Help from  another person eating meals?: None Help from another person taking care of personal grooming?: A Little Help from another person toileting, which includes using toliet, bedpan, or urinal?: A Lot Help from another person bathing (including washing, rinsing, drying)?: A Lot Help from another person to put on and taking off regular upper body clothing?: A Little Help from another person to put on and taking off regular lower body clothing?: A Lot 6 Click Score: 16   End of Session Nurse Communication: Other (comment) (nurse cleared patient to participate, was present with patient for most of session after onset of nose bleed)  Activity Tolerance: Other (comment) (nose bleed) Patient left:    OT Visit Diagnosis: Unsteadiness on feet (R26.81)                Time: 4268-3419 OT Time Calculation (min): 23 min Charges:  OT General Charges $OT Visit: 1 Visit OT Evaluation $OT Eval Low Complexity: 1 Low OT Treatments $Self Care/Home Management :  8-22 mins  Jackelyn Poling OTR/L, [place] Acute Rehabilitation Department Office# [phone] Pager# [phone]   [place] 11/15/2020, 1:23 PM

## 2020-11-16 ENCOUNTER — Telehealth: Payer: Self-pay | Admitting: Licensed Clinical Social Worker

## 2020-11-16 ENCOUNTER — Other Ambulatory Visit (HOSPITAL_COMMUNITY): Payer: Self-pay

## 2020-11-16 LAB — BASIC METABOLIC PANEL
Anion gap: 4 — ABNORMAL LOW (ref 5–15)
BUN: 29 mg/dL — ABNORMAL HIGH (ref 8–23)
CO2: 25 mmol/L (ref 22–32)
Calcium: 7.9 mg/dL — ABNORMAL LOW (ref 8.9–10.3)
Chloride: 106 mmol/L (ref 98–111)
Creatinine, Ser: 1.15 mg/dL — ABNORMAL HIGH (ref 0.44–1.00)
GFR, Estimated: 52 mL/min — ABNORMAL LOW (ref >60)
Glucose, Bld: 243 mg/dL — ABNORMAL HIGH (ref 70–99)
Potassium: 4 mmol/L (ref 3.5–5.1)
Sodium: 135 mmol/L (ref 135–145)

## 2020-11-16 LAB — HEMOGLOBIN A1C
Hgb A1c MFr Bld: 11.9 % — ABNORMAL HIGH (ref 4.8–5.6)
Mean Plasma Glucose: 295 mg/dL

## 2020-11-16 LAB — GLUCOSE, CAPILLARY
Glucose-Capillary: 305 mg/dL — ABNORMAL HIGH (ref 70–99)
Glucose-Capillary: 230 mg/dL — ABNORMAL HIGH (ref 70–99)

## 2020-11-16 LAB — OCCULT BLOOD, POC DEVICE: Fecal Occult Bld: POSITIVE — AB

## 2020-11-16 MED ORDER — GLUCERNA SHAKE PO LIQD: 237.0000 mL | Freq: Two times a day (BID) | ORAL | 0 refills | Status: DC

## 2020-11-16 MED ORDER — LANTUS SOLOSTAR 100 UNIT/ML ~~LOC~~ SOPN
30.0000 [IU] | PEN_INJECTOR | Freq: Every day | SUBCUTANEOUS | 0 refills | Status: DC
  Filled 2020-11-16: qty 9, 30d supply, fill #0

## 2020-11-16 NOTE — Discharge Summary (Signed)
Physician Discharge Summary  Mandy Mullen HGD:[id number] DOB: August 04, 1950 DOA: 11/13/2020  PCP: Lucious Groves, DO  Admit date: 11/13/2020 Discharge date: 11/16/2020  Admitted From: Home Disposition: Home  Recommendations for Outpatient Follow-up:  Follow up with PCP in 1 week Please follow up on the following pending results: None  Home Health: None Equipment/Devices: None  Discharge Condition: Stable CODE STATUS: Full code Diet recommendation: Carb modified   Brief/Interim Summary:  Admission HPI written by Kayleen Memos, DO   HPI: Mandy Mullen is a 70 y.o. female with medical history significant for uncontrolled type 2 diabetes recently started on Trulicity, essential hypertension, hyperlipidemia, history of DKA who presented to the [facility] ED from home via EMS due to nausea vomiting x1 week associated with hyperglycemia.  Denies running out of her insulin.  States that since she started taking Trulicity she has been feeling poorly with frequent nausea.  History is limited as the patient continually drifts to sleep during the interview.  Denies diarrhea or constipation.  Last bowel movement was yesterday, states it was black.  Admits to abdominal tenderness with palpation at epigastric region.  Upon EMS arrival to her home her CBG was greater than 600.  She was brought into the ED for further evaluation and management of her symptomatology.     In the ED lab studies were remarkable for serum glucose greater than 700, i-STAT bicarb 10, potassium 6.5, BUN 106, creatinine 3.40, sodium 125.  WBC 24.7, hemoglobin 10.9, MCV 94.  UA is pending, COVID-19 screening test is also pending.  Patient received 1 g of IV calcium gluconate x1 and 10 units IV insulin for hyperkalemia.  She also received 1 amp of sodium bicarb and 2 L IV fluid boluses normal saline.  TRH, hospitalist team, was asked to admit.   Hospital course:  DKA Patient met criteria for severe DKA on admission. On  admission, ABG 7.13/21.1/80.4/6.7, glucose of 1046, anion gap of 30 and beta-hydroxybutyric acid of 12.04. Hemoglobin A1C of 11.9%. Patient given 3 L NS bolus and started on insulin IV. Glucose, bicarb and anion gap resolved on IV insulin. Patient transitioned to [facility] 30 units, Novolog 4 units TID with meals and SSI. On discharge, patient transitioned back to home Lantus 30 units. She preferred to return to previous regimen of Novolog rather than scheduled mealtime coverage. Continue Trulicity.   Diabetes mellitus type 2 Most recent hemoglobin A1C prior to admission of 12.9% with current hemoglobin A1C of 11.9%. Patient states she was taken off Lantus and started on Ozempic  (not noted on medication history). Requesting to restart Lantus. Medications as mentioned above.   Hyperkalemia Likely falsely elevated in setting of DKA. Calcium gluconate given. Resolved with treatment of DKA.   Metabolic acidosis Associated compensatory respiratory alkalosis. Secondary to DKA.   AKI on CKD stage IIIa Secondary to dehydration from DKA. Baseline creatinine of 1.3. Creatinine of 3.40 on admission. Resolved with IV fluids. Creatinine of 1.15 on day of discharge.   Leukocytosis No evidence of infection. Likely reactive in setting of severe DKA. Improved.   Pseudohyponatremia Secondary to severe hyperglycemia. Resolved.   Iron deficiency anemia Patient is on iron supplementation as an outpatient. Stable.   Hyperlipidemia Patient states she does not take this secondary to preference.   Hypocalcemia Supplementation with Tums  Discharge Diagnoses:  Principal Problem:   DKA, type 2 ([place]) Active Problems:   Hyperlipidemia   Hypertension associated with diabetes ([place])   CKD stage 3 due  to type 2 diabetes mellitus ([place])   Acute renal failure superimposed on stage 3a chronic kidney disease [facility])    Discharge Instructions  Discharge Instructions     Amb Referral to Nutrition and Diabetic  Education   Complete by: As directed    Increase activity slowly   Complete by: As directed       Allergies as of 11/16/2020       Reactions   Lisinopril Itching, Swelling, Rash   Angioedema and diffuse body rash   Bactrim [sulfamethoxazole-trimethoprim] Other (See Comments)   Erythroderma like reaction over 80% of her body    Eggs Or Egg-derived Products Nausea Only, Other (See Comments)   REACTION: Nausea. Chills/fevers x 6 months (after a flu shot in the 1990s).   Metformin And Related Rash   Shellfish Allergy Hives, Itching, Swelling, Rash        Medication List     STOP taking these medications    insulin lispro 100 UNIT/ML injection Commonly known as: HumaLOG   INSULIN SYRINGE .3CC/31GX5/16" 31G X 5/16" 0.3 ML Misc       TAKE these medications    feeding supplement (GLUCERNA SHAKE) Liqd Take 237 mLs by mouth 2 (two) times daily between meals.   ferrous sulfate 325 (65 FE) MG tablet Take 325 mg by mouth daily.   freestyle lancets Use 1 lancet to check blood glucose 3 times daily.   glucose blood test strip Use 1 strip to check blood glucose 3 times daily.   insulin aspart 100 UNIT/ML FlexPen Commonly known as: NOVOLOG Inject 6-14 Units into the skin 3 (three) times daily with meals. Inject 6 units with meals plus additional sliding scale for CBG above 180.   Insulin Pen Needle 32G X 4 MM Misc Use in the morning, at noon, in the evening, and at bedtime.   Lantus SoloStar 100 UNIT/ML Solostar Pen Generic drug: insulin glargine Inject 30 Units into the skin daily. What changed: Another medication with the same name was removed. Continue taking this medication, and follow the directions you see here.   rosuvastatin 20 MG tablet Commonly known as: Crestor Take 1 tablet (20 mg total) by mouth daily.   Trulicity 7.78 EU/2.3NT Sopn Generic drug: Dulaglutide Inject 0.75 mg into the skin once a week. This is a patient assistance medication. Patient may  not be approved and/or have medication. Please ask and verify when performing med review.   VITAMIN C PO Take 1 tablet by mouth daily.        Allergies  Allergen Reactions   Lisinopril Itching, Swelling and Rash    Angioedema and diffuse body rash   Bactrim [Sulfamethoxazole-Trimethoprim] Other (See Comments)    Erythroderma like reaction over 80% of her body    Eggs Or Egg-Derived Products Nausea Only and Other (See Comments)    REACTION: Nausea. Chills/fevers x 6 months (after a flu shot in the 1990s).   Metformin And Related Rash   Shellfish Allergy Hives, Itching, Swelling and Rash    Consultations: None   Procedures/Studies: No results found.    Subjective: No issues this morning or overnight.  Discharge Exam: Vitals:   11/16/20 0617 11/16/20 1300  BP: (!) 120/59 125/67  Pulse: 94 87  Resp: 18 16  Temp: 98.8 F (37.1 C) 98.3 F (36.8 C)  SpO2: 96% 100%   Vitals:   11/15/20 1829 11/15/20 2115 11/16/20 0617 11/16/20 1300  BP: (!) 110/56 125/71 (!) 120/59 125/67  Pulse: 95 88 94 87  Resp: _0 Temp: 100.1 F (37.8 C) 99.7 F (37.6 C) 98.8 F (37.1 C) 98.3 F (36.8 C)  TempSrc: Oral Oral Oral Oral  SpO2: 94% 98% 96% 100%  Weight:      Height:        General: Pt is alert, awake, not in acute distress Cardiovascular: RRR, S1/S2 +, no rubs, no gallops Respiratory: CTA bilaterally, no wheezing, no rhonchi Abdominal: Soft, NT, ND, bowel sounds + Extremities: no edema, no cyanosis    The results of significant diagnostics from this hospitalization (including imaging, microbiology, ancillary and laboratory) are listed below for reference.     Microbiology: Recent Results (from the past 240 hour(s))  Resp Panel by RT-PCR (Flu A&B, Covid) Nasopharyngeal Swab     Status: None   Collection Time: 11/13/20  8:27 PM   Specimen: Nasopharyngeal Swab; Nasopharyngeal(NP) swabs in vial transport medium  Result Value Ref Range Status   SARS  Coronavirus 2 by RT PCR NEGATIVE NEGATIVE Final    Comment: (NOTE) SARS-CoV-2 target nucleic acids are NOT DETECTED.  The SARS-CoV-2 RNA is generally detectable in upper respiratory specimens during the acute phase of infection. The lowest concentration of SARS-CoV-2 viral copies this assay can detect is 138 copies/mL. A negative result does not preclude SARS-Cov-2 infection and should not be used as the sole basis for treatment or other patient management decisions. A negative result may occur with  improper specimen collection/handling, submission of specimen other than nasopharyngeal swab, presence of viral mutation(s) within the areas targeted by this assay, and inadequate number of viral copies(<138 copies/mL). A negative result must be combined with clinical observations, patient history, and epidemiological information. The expected result is Negative.  Fact Sheet for Patients:  EntrepreneurPulse.com.au  Fact Sheet for Healthcare Providers:  IncredibleEmployment.be  This test is no t yet approved or cleared by the [place] FDA and  has been authorized for detection and/or diagnosis of SARS-CoV-2 by FDA under an Emergency Use Authorization (EUA). This EUA will remain  in effect (meaning this test can be used) for the duration of the COVID-19 declaration under Section 564(b)(1) of the Act, 21 U.S.C.section 360bbb-3(b)(1), unless the authorization is terminated  or revoked sooner.       Influenza A by PCR NEGATIVE NEGATIVE Final   Influenza B by PCR NEGATIVE NEGATIVE Final    Comment: (NOTE) The Xpert Xpress SARS-CoV-2/FLU/RSV plus assay is intended as an aid in the diagnosis of influenza from Nasopharyngeal swab specimens and should not be used as a sole basis for treatment. Nasal washings and aspirates are unacceptable for Xpert Xpress SARS-CoV-2/FLU/RSV testing.  Fact Sheet for  Patients: EntrepreneurPulse.com.au  Fact Sheet for Healthcare Providers: IncredibleEmployment.be  This test is not yet approved or cleared by the [place] FDA and has been authorized for detection and/or diagnosis of SARS-CoV-2 by FDA under an Emergency Use Authorization (EUA). This EUA will remain in effect (meaning this test can be used) for the duration of the COVID-19 declaration under Section 564(b)(1) of the Act, 21 U.S.C. section 360bbb-3(b)(1), unless the authorization is terminated or revoked.  Performed at [facility], [place] [address], [place], [place] [address]   MRSA Next Gen by PCR, Nasal     Status: None   Collection Time: 11/13/20 10:31 PM   Specimen: Nasal Mucosa; Nasal Swab  Result Value Ref Range Status   MRSA by PCR Next Gen NOT DETECTED NOT DETECTED Final    Comment: (NOTE) The GeneXpert MRSA  Assay (FDA approved for NASAL specimens only), is one component of a comprehensive MRSA colonization surveillance program. It is not intended to diagnose MRSA infection nor to guide or monitor treatment for MRSA infections. Test performance is not FDA approved in patients less than 40 years old. Performed at [facility], [place] [address] [place] , [place] [address]      Labs: BNP (last 3 results) No results for input(s): BNP in the last 8760 hours. Basic Metabolic Panel: Recent Labs  Lab 11/13/20 2358 11/14/20 0407 11/14/20 0905 11/15/20 0252 11/16/20 0321  NA 133* 133* 137 140 135  K 4.3 4.4 3.8 3.8 4.0  CL 100 103 109 108 106  CO2 9* 17* _0 GLUCOSE 786* 541* 268* 189* 243*  BUN 102* 98* 83* 50* 29*  CREATININE 3.90* 3.40* 2.60* 1.25* 1.15*  CALCIUM 6.7* 7.1* 7.3* 7.7* 7.9*  MG  --  2.4  --   --   --   PHOS  --  3.5  --   --   --    Liver Function Tests: Recent Labs  Lab 11/13/20 2002  AST 53*  ALT 52*  ALKPHOS 116  BILITOT 1.4*  PROT 6.6  ALBUMIN 3.6   No  results for input(s): LIPASE, AMYLASE in the last 168 hours. No results for input(s): AMMONIA in the last 168 hours. CBC: Recent Labs  Lab 11/13/20 1948 11/13/20 2002 11/14/20 0407 11/15/20 0823  WBC  --  24.7* 25.3* 12.7*  NEUTROABS  --  21.2*  --   --   HGB 12.2 10.9* 9.8* 10.6*  HCT 36.0 34.2* 28.8* 30.6*  MCV  --  94.2 88.3 87.4  PLT  --  295 248 224   Cardiac Enzymes: No results for input(s): CKTOTAL, CKMB, CKMBINDEX, TROPONINI in the last 168 hours. BNP: Invalid input(s): POCBNP CBG: Recent Labs  Lab 11/15/20 1113 11/15/20 1643 11/15/20 2108 11/16/20 0736 11/16/20 1211  GLUCAP 147* 112* 76 305* 230*   D-Dimer No results for input(s): DDIMER in the last 72 hours. Hgb A1c Recent Labs    11/14/20 0407  HGBA1C 11.9*   Lipid Profile No results for input(s): CHOL, HDL, LDLCALC, TRIG, CHOLHDL, LDLDIRECT in the last 72 hours. Thyroid function studies No results for input(s): TSH, T4TOTAL, T3FREE, THYROIDAB in the last 72 hours.  Invalid input(s): FREET3 Anemia work up No results for input(s): VITAMINB12, FOLATE, FERRITIN, TIBC, IRON, RETICCTPCT in the last 72 hours. Urinalysis    Component Value Date/Time   COLORURINE YELLOW 07/29/2020 1302   APPEARANCEUR CLEAR 07/29/2020 1302   LABSPEC 1.017 07/29/2020 1302   PHURINE 7.0 07/29/2020 1302   GLUCOSEU 150 (A) 07/29/2020 1302   HGBUR NEGATIVE 07/29/2020 1302   BILIRUBINUR NEGATIVE 07/29/2020 1302   KETONESUR 20 (A) 07/29/2020 1302   PROTEINUR NEGATIVE 07/29/2020 1302   UROBILINOGEN 0.2 04/09/2012 1648   NITRITE NEGATIVE 07/29/2020 1302   LEUKOCYTESUR NEGATIVE 07/29/2020 1302   Sepsis Labs Invalid input(s): PROCALCITONIN,  WBC,  LACTICIDVEN Microbiology Recent Results (from the past 240 hour(s))  Resp Panel by RT-PCR (Flu A&B, Covid) Nasopharyngeal Swab     Status: None   Collection Time: 11/13/20  8:27 PM   Specimen: Nasopharyngeal Swab; Nasopharyngeal(NP) swabs in vial transport medium  Result Value Ref  Range Status   SARS Coronavirus 2 by RT PCR NEGATIVE NEGATIVE Final    Comment: (NOTE) SARS-CoV-2 target nucleic acids are NOT DETECTED.  The SARS-CoV-2 RNA is generally detectable in upper respiratory specimens during the  acute phase of infection. The lowest concentration of SARS-CoV-2 viral copies this assay can detect is 138 copies/mL. A negative result does not preclude SARS-Cov-2 infection and should not be used as the sole basis for treatment or other patient management decisions. A negative result may occur with  improper specimen collection/handling, submission of specimen other than nasopharyngeal swab, presence of viral mutation(s) within the areas targeted by this assay, and inadequate number of viral copies(<138 copies/mL). A negative result must be combined with clinical observations, patient history, and epidemiological information. The expected result is Negative.  Fact Sheet for Patients:  EntrepreneurPulse.com.au  Fact Sheet for Healthcare Providers:  IncredibleEmployment.be  This test is no t yet approved or cleared by the [place] FDA and  has been authorized for detection and/or diagnosis of SARS-CoV-2 by FDA under an Emergency Use Authorization (EUA). This EUA will remain  in effect (meaning this test can be used) for the duration of the COVID-19 declaration under Section 564(b)(1) of the Act, 21 U.S.C.section 360bbb-3(b)(1), unless the authorization is terminated  or revoked sooner.       Influenza A by PCR NEGATIVE NEGATIVE Final   Influenza B by PCR NEGATIVE NEGATIVE Final    Comment: (NOTE) The Xpert Xpress SARS-CoV-2/FLU/RSV plus assay is intended as an aid in the diagnosis of influenza from Nasopharyngeal swab specimens and should not be used as a sole basis for treatment. Nasal washings and aspirates are unacceptable for Xpert Xpress SARS-CoV-2/FLU/RSV testing.  Fact Sheet for  Patients: EntrepreneurPulse.com.au  Fact Sheet for Healthcare Providers: IncredibleEmployment.be  This test is not yet approved or cleared by the [place] FDA and has been authorized for detection and/or diagnosis of SARS-CoV-2 by FDA under an Emergency Use Authorization (EUA). This EUA will remain in effect (meaning this test can be used) for the duration of the COVID-19 declaration under Section 564(b)(1) of the Act, 21 U.S.C. section 360bbb-3(b)(1), unless the authorization is terminated or revoked.  Performed at [facility], [place] [address], [place], [place] [address]   MRSA Next Gen by PCR, Nasal     Status: None   Collection Time: 11/13/20 10:31 PM   Specimen: Nasal Mucosa; Nasal Swab  Result Value Ref Range Status   MRSA by PCR Next Gen NOT DETECTED NOT DETECTED Final    Comment: (NOTE) The GeneXpert MRSA Assay (FDA approved for NASAL specimens only), is one component of a comprehensive MRSA colonization surveillance program. It is not intended to diagnose MRSA infection nor to guide or monitor treatment for MRSA infections. Test performance is not FDA approved in patients less than 16 years old. Performed at [facility], [place] [address] [place], [place] [address]      Time coordinating discharge: 35 minutes  SIGNED:   Cordelia Poche, MD [facility] 11/16/2020, 3:25 PM

## 2020-11-16 NOTE — Progress Notes (Signed)
Inpatient Diabetes Program Recommendations  AACE/ADA: New Consensus Statement on Inpatient Glycemic Control (2015)  Target Ranges:  Prepandial:   less than 140 mg/dL      Peak postprandial:   less than 180 mg/dL (1-2 hours)      Critically ill patients:  140 - 180 mg/dL   Lab Results  Component Value Date   GLUCAP 230 (H) 11/16/2020   HGBA1C 11.9 (H) 11/14/2020    Review of Glycemic Control  Diabetes history: DM2 Outpatient Diabetes medications: Basaglar 36 units QD, Novolog 6 units TID with meals plus additional units for correction, Trulicity 5.91 mg weekly Current orders for Inpatient glycemic control: Semglee 30 units QD, Novolog 0-15 units TID with  meals + 4 units TID with meals  HgbA1C - 11.9% - down from 12.9% on 07/27/20.  Inpatient Diabetes Program Recommendations:    Spoke with pt at bedside regarding HgbA1C lowered to 11.9% in the last 3 months. Pt wants to get back on her Basaglar and Novolog in which pt stopped taking when started on Trulicity.  Recs for home:   Basaglar 34 units QD Novolog (has sliding scale - pt states approx 5 units TID) F/U with Endo and take glucose meter for MD to review   Answered questions and pt appreciative of visit.  Thank you. Lorenda Peck, RD, LDN, CDE Inpatient Diabetes Coordinator [phone]

## 2020-11-16 NOTE — Evaluation (Signed)
Physical Therapy Evaluation Patient Details Name: Mandy Mullen MRN: [id number] DOB: 10-30-1950 Today's Date: 11/16/2020  History of Present Illness  Patient is a 70 year old female who presented to the hosptial with nausea and vomitting. patient was found to have DKA. PMH: DM, hyperlipidemia, HTN, CKD  Clinical Impression  Pt is mobilizing independently. She ambulated 240' without an assistive device, no loss of balance. She is ready to DC home from a PT standpoint, no further PT indicated, will sign off.        Recommendations for follow up therapy are one component of a multi-disciplinary discharge planning process, led by the attending physician.  Recommendations may be updated based on patient status, additional functional criteria and insurance authorization.  Follow Up Recommendations No PT follow up    Assistance Recommended at Discharge None  Functional Status Assessment Patient has not had a recent decline in their functional status  Equipment Recommendations  None recommended by PT    Recommendations for Other Services       Precautions / Restrictions Precautions Precautions: None Precaution Comments: denies h/o falls in past 6 months Restrictions Weight Bearing Restrictions: No      Mobility  Bed Mobility               General bed mobility comments: sitting up at EOB    Transfers Overall transfer level: Independent Equipment used: None                    Ambulation/Gait Ambulation/Gait assistance: Independent Gait Distance (Feet): 240 Feet Assistive device: None Gait Pattern/deviations: WFL(Within Functional Limits) Gait velocity: WNL   General Gait Details: no LOB  Stairs            Wheelchair Mobility    Modified Rankin (Stroke Patients Only)       Balance Overall balance assessment: Independent                                           Pertinent Vitals/Pain Pain Assessment: No/denies pain     Home Living Family/patient expects to be discharged to:: Private residence Living Arrangements: Children;Other relatives Available Help at Discharge: Family Type of Home: House Home Access: Stairs to enter Entrance Stairs-Rails: Can reach both Entrance Stairs-Number of Steps: 5   Home Layout: One level   Additional Comments: Pt lives with son and grandson. 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 Pt's grandson works during day. Per pt, she has cousins who may provide assist at home if needed.    Prior Function Prior Level of Function : Independent/Modified Independent;Driving             Mobility Comments: walks without AD, denies falls in past 6 months       Hand Dominance   Dominant Hand: Right    Extremity/Trunk Assessment   Upper Extremity Assessment Upper Extremity Assessment: Defer to OT evaluation    Lower Extremity Assessment Lower Extremity Assessment: Overall WFL for tasks assessed    Cervical / Trunk Assessment Cervical / Trunk Assessment: Normal  Communication   Communication: No difficulties  Cognition Arousal/Alertness: Awake/alert Behavior During Therapy: WFL for tasks assessed/performed Overall Cognitive Status: Within Functional Limits for tasks assessed  General Comments      Exercises     Assessment/Plan    PT Assessment Patient does not need any further PT services  PT Problem List         PT Treatment Interventions      PT Goals (Current goals can be found in the Care Plan section)  Acute Rehab PT Goals PT Goal Formulation: All assessment and education complete, DC therapy    Frequency     Barriers to discharge        Co-evaluation               AM-PAC PT "6 Clicks" Mobility  Outcome Measure Help needed turning from your back to your side while in a flat bed without using bedrails?: None Help needed moving from lying on your back to sitting on  the side of a flat bed without using bedrails?: None Help needed moving to and from a bed to a chair (including a wheelchair)?: None Help needed standing up from a chair using your arms (e.g., wheelchair or bedside chair)?: None Help needed to walk in hospital room?: None Help needed climbing 3-5 steps with a railing? : None 6 Click Score: 24    End of Session   Activity Tolerance: Patient tolerated treatment well Patient left: in chair;with call bell/phone within reach Nurse Communication: Mobility status      Time: 5003-7048 PT Time Calculation (min) (ACUTE ONLY): 8 min   Charges:   PT Evaluation $PT Eval Low Complexity: 1 Low         Philomena Doheny PT 11/16/2020  Acute Rehabilitation Services Pager [phone] Office [phone]

## 2020-11-17 ENCOUNTER — Ambulatory Visit: Payer: Self-pay

## 2020-11-17 ENCOUNTER — Ambulatory Visit: Payer: Self-pay | Admitting: Licensed Clinical Social Worker

## 2020-11-17 NOTE — Chronic Care Management (AMB) (Signed)
  Care Management   Social Work Visit Note  11/17/2020 Name: NAVJOT PILGRIM MRN: [id number] DOB: 08-24-1950  [name] is a 70 y.o. year old female who sees Lucious Groves, DO for primary care. The care management team was consulted for assistance with care management and care coordination needs related to [facility] Resources    Patient was given the following information about care management and care coordination services today, agreed to services, and gave verbal consent: 1.care management/care coordination services include personalized support from designated clinical staff supervised by their physician, including individualized plan of care and coordination with other care providers 2. 24/7 contact phone numbers for assistance for urgent and routine care needs. 3. The patient may stop care management/care coordination services at any time by phone call to the office staff.  Engaged with patient by telephone for follow up visit in response to provider referral for social work chronic care management and care coordination services.  Assessment: Review of patient history, allergies, and health status during evaluation of patient need for care management/care coordination services.    Interventions:  Patient interviewed and appropriate assessments performed Collaborated with clinical team regarding patient needs  Patient advised she had not yet reached out to financial assistance. Patient requested contact information for the department. Patient requested information to be texted. SW sent patient text message with all information.  Patient advised of no additional needs. Patient requested SW to contact her next month.   SDOH (Social Determinants of Health) assessments performed: No     Plan:  SW will follow up with patient within 30 days  [name], [place] Worker [facility] Care Management  [phone]

## 2020-11-17 NOTE — Chronic Care Management (AMB) (Signed)
Transition Care Management Follow-up Telephone Call Date of discharge and from where: 11/13/20-11/16/20@ [facility] How have you been since you were released from the hospital? "I occasionally feel a little woozy, but it is much better. Any questions or concerns? No  Items Reviewed: Did the pt receive and understand the discharge instructions provided? Yes  Medications obtained and verified? Yes - Patient stated she was discharged on Trulicity. She is refusing to take it since she stated that is why she ended up in the hospital. Other? No  Any new allergies since your discharge? No  Dietary orders reviewed? Yes Do you have support at home? Yes   Home Care and Equipment/Supplies: Were home health services ordered? no If so, what is the name of the agency? N/a  Has the agency set up a time to come to the patient's home? not applicable Were any new equipment or medical supplies ordered?  No What is the name of the medical supply agency? N/a Were you able to get the supplies/equipment? not applicable Do you have any questions related to the use of the equipment or supplies? No  Functional Questionnaire: (I = Independent and D = Dependent) ADLs: I  Bathing/Dressing- I  Meal Prep- I  Eating- I  Maintaining continence- I  Transferring/Ambulation- I  Managing Meds- I  Follow up appointments reviewed:  PCP Hospital f/u appt confirmed? No  - Pt to call when she verifies her son's ability to bring her to appointment. [facility] f/u appt confirmed? No  Are transportation arrangements needed? No  If their condition worsens, is the pt aware to call PCP or go to the Emergency Dept.? Yes Was the patient provided with contact information for the PCP's office or ED? Yes Was to pt encouraged to call back with questions or concerns? Yes  Johnney Killian, RN, BSN, CCM Care Management Coordinator [facility] Internal Medicine Phone: [phone]: [phone]

## 2020-11-17 NOTE — Patient Instructions (Signed)
Visit Information  Instructions: patient will work with SW to address concerns related to financial constraints.   Patient was given the following information about care management and care coordination services today, agreed to services, and gave verbal consent: 1.care management/care coordination services include personalized support from designated clinical staff supervised by their physician, including individualized plan of care and coordination with other care providers 2. 24/7 contact phone numbers for assistance for urgent and routine care needs. 3. The patient may stop care management/care coordination services at any time by phone call to the office staff.  Patient verbalizes understanding of instructions provided today and agrees to view in [place].   The care management team will reach out to the patient again over the next 30 days.   [name], [place]  Social Worker [facility] Care Management  [phone]

## 2020-11-18 ENCOUNTER — Other Ambulatory Visit (HOSPITAL_COMMUNITY): Payer: Self-pay

## 2020-11-27 ENCOUNTER — Other Ambulatory Visit (HOSPITAL_COMMUNITY): Payer: Self-pay

## 2020-11-30 ENCOUNTER — Encounter: Payer: Self-pay | Admitting: Internal Medicine

## 2020-11-30 ENCOUNTER — Ambulatory Visit (INDEPENDENT_AMBULATORY_CARE_PROVIDER_SITE_OTHER): Payer: Self-pay | Admitting: Pharmacist

## 2020-11-30 ENCOUNTER — Other Ambulatory Visit: Payer: Self-pay

## 2020-11-30 ENCOUNTER — Ambulatory Visit (INDEPENDENT_AMBULATORY_CARE_PROVIDER_SITE_OTHER): Payer: Self-pay | Admitting: Internal Medicine

## 2020-11-30 ENCOUNTER — Other Ambulatory Visit (HOSPITAL_COMMUNITY): Payer: Self-pay

## 2020-11-30 VITALS — BP 153/70 | HR 77 | Temp 98.3°F | Ht 63.0 in | Wt 170.0 lb

## 2020-11-30 DIAGNOSIS — E1159 Type 2 diabetes mellitus with other circulatory complications: Secondary | ICD-10-CM

## 2020-11-30 DIAGNOSIS — E1122 Type 2 diabetes mellitus with diabetic chronic kidney disease: Secondary | ICD-10-CM

## 2020-11-30 DIAGNOSIS — N183 Chronic kidney disease, stage 3 unspecified: Secondary | ICD-10-CM

## 2020-11-30 DIAGNOSIS — I152 Hypertension secondary to endocrine disorders: Secondary | ICD-10-CM

## 2020-11-30 DIAGNOSIS — Z794 Long term (current) use of insulin: Secondary | ICD-10-CM

## 2020-11-30 DIAGNOSIS — E785 Hyperlipidemia, unspecified: Secondary | ICD-10-CM

## 2020-11-30 DIAGNOSIS — E1165 Type 2 diabetes mellitus with hyperglycemia: Secondary | ICD-10-CM

## 2020-11-30 MED ORDER — LANTUS SOLOSTAR 100 UNIT/ML ~~LOC~~ SOPN
30.0000 [IU] | PEN_INJECTOR | Freq: Every day | SUBCUTANEOUS | 0 refills | Status: DC
  Filled 2020-11-30: qty 15, 50d supply, fill #0

## 2020-11-30 MED ORDER — ROSUVASTATIN CALCIUM 20 MG PO TABS
20.0000 mg | ORAL_TABLET | Freq: Every day | ORAL | 3 refills | Status: DC
  Filled 2020-11-30: qty 90, 90d supply, fill #0
  Filled 2021-01-12: qty 30, 30d supply, fill #0
  Filled 2021-03-22: qty 30, 30d supply, fill #1
  Filled 2021-04-23: qty 30, 30d supply, fill #2
  Filled 2021-10-05: qty 30, 30d supply, fill #3

## 2020-11-30 MED ORDER — AMLODIPINE BESYLATE 10 MG PO TABS
10.0000 mg | ORAL_TABLET | Freq: Every day | ORAL | 11 refills | Status: DC
  Filled 2020-11-30: qty 30, 30d supply, fill #0
  Filled 2021-02-04: qty 30, 30d supply, fill #1
  Filled 2021-04-22: qty 30, 30d supply, fill #2
  Filled 2021-07-12: qty 30, 30d supply, fill #3
  Filled 2021-10-05: qty 30, 30d supply, fill #4

## 2020-11-30 MED ORDER — LANTUS SOLOSTAR 100 UNIT/ML ~~LOC~~ SOPN
35.0000 [IU] | PEN_INJECTOR | Freq: Every day | SUBCUTANEOUS | 0 refills | Status: DC
  Filled 2020-11-30: qty 15, 42d supply, fill #0
  Filled 2020-12-18: qty 9, 25d supply, fill #0
  Filled 2021-01-19: qty 9, 25d supply, fill #1

## 2020-11-30 MED ORDER — AMLODIPINE BESYLATE 10 MG PO TABS
10.0000 mg | ORAL_TABLET | Freq: Every day | ORAL | 11 refills | Status: DC
  Filled 2020-11-30: qty 30, 30d supply, fill #0

## 2020-11-30 NOTE — Patient Instructions (Signed)
Mandy Mullen it was a pleasure seeing you today.   Please do the following:  Please take 30 units of Lantus once a day as directed today during your appointment. If you have any questions or if you believe something has occurred because of this change, call me or your doctor to let one of [name] know.  For your Novolog you will check your blood sugar BEFORE you are about to eat and then take 4 units and add the appropriate additional amount as shown below: Blood sugar before meal Number of units to add to 4 units  Less than 180 0 unit  181 -  230 1 units  231 -  280 2 units  281 -  330 3 units  331 -  380 4 units  381 -  430 5 units  431 -  480 6 units  481 -  530 7 units  531 -  580 8 units    Continue checking blood sugars at home. Check your blood sugars first thing in the morning and before each meal. It's really important that you record these and bring these in to your next doctor's appointment.  Continue making the lifestyle changes we've discussed together during our visit. Diet and exercise play a significant role in improving your blood sugars.  Follow-up with me December 1st at 2:30 PM  Hypoglycemia or low blood sugar:   Low blood sugar can happen quickly and may become an emergency if not treated right away.   While this shouldn't happen often, it can be brought upon if you skip a meal or do not eat enough. Also, if your insulin or other diabetes medications are dosed too high, this can cause your blood sugar to go to low.   Warning signs of low blood sugar include: Feeling shaky or dizzy Feeling weak or tired  Excessive hunger Feeling anxious or upset  Sweating even when you aren't exercising  What to do if I experience low blood sugar? Follow the Rule of 15 Check your blood sugar with your meter. If lower than 70, proceed to step 2.  Treat with 15 grams of fast acting carbs which is found in 3-4 glucose tablets. If none are available you can try hard candy, 1 tablespoon  of sugar or honey,4 ounces of fruit juice, or 6 ounces of REGULAR soda.  Re-check your sugar in 15 minutes. If it is still below 70, do what you did in step 2 again. If your blood sugar has come back up, go ahead and eat a snack or small meal made up of complex carbs (ex. Whole grains) and protein at this time to avoid recurrence of low blood sugar.

## 2020-11-30 NOTE — Patient Instructions (Addendum)
Thank you, [name] for allowing [name] to provide your care today. Today we discussed your diabetes regimen.    Please do the following per pharmacy:   Please take 35 units of Lantus once a day as directed today during your appointment. If you have any questions or if you believe something has occurred because of this change, call me or your doctor to let one of [name] know.  For your Novolog you will check your blood sugar BEFORE you are about to eat and then take 4 units and add the appropriate additional amount as shown below:  Blood sugar before meal Number of units to add to 4 units  Less than 180 0 unit  181 -  230 1 units  231 -  280 2 units  281 -  330 3 units  331 -  380 4 units  381 -  430 5 units  431 -  480 6 units  481 -  530 7 units  531 -  580 8 units    Continue checking blood sugars at home. Check your blood sugars first thing in the morning and before each meal. It's really important that you record these and bring these in to your next doctor's appointment.    I have ordered the following labs for you:  Lab Orders         BMP8+Anion Gap         Glucose Serum      Referrals ordered today:   Referral Orders  No referral(s) requested today   Follow up:  2 weeks    Should you have any questions or concerns please call the internal medicine clinic at [phone].

## 2020-11-30 NOTE — Assessment & Plan Note (Signed)
  T2DM is not controlled likely due to lack of understanding and concern for continued confusion surrounding medications. It is apparent that patient is not aware that she was hospitalized for DKA and elevated blood glucose. Spent time with patient discussing this and that Trulicity does not cause hyperglycemia. Stated cause of hyperglycemia was likely due to patient discontinuing Lantus the day she started Trulicity and then not taking any for the subsequent 4 days leading up to admission. Patient also states she was not aware that she was supposed to take 6 units of Novolog and add the correct amount of units based on the sliding scale chart provided to her as she was only taking the corrective units therefor some days she was not taking any. She also reports she continued to do this post-prandially rather than checking and administering insulin prior to food consumption. Reminded patient importance of checking glucose BEFORE a meal and administering Novolog based on the reading before she eats. Also discussed the difference between Trulicity and Lantus. Patient not interested in restarting Trulicity at this time. Following instruction patient verbalized understanding of treatment plan.    1. Continue basal insulin Lantus (insulin glargine) 30 units and then titrate up based on fasting glucose readings at next visit 2. Adjusted dose of rapid insulin Novolog (insulin aspart) to 4 units plus the appropriate additional amount based on sliding scale provided. Patient was instructed to inject Novolog (insulin aspart) 30 minutes before her meal, instead of after. 3. Also reminded patient to start checking blood sugar in the morning right after waking up. 4. Next A1C anticipated in January 2023  Follow-up appointment  two weeks to review sugar readings.

## 2020-11-30 NOTE — Progress Notes (Signed)
   CC: hospital follow-up  HPI:  Mandy Mullen is a 70 y.o. with past medical history as noted below who presents to the clinic today for hospital follow-up. Please see problem-based list for further details, assessments, and plans.  Past Medical History:  Diagnosis Date   History of blood transfusion 1991   "related to hysterectomy/left ovary OR" (11/28/2017)   History of herpes genitalis    Hyperlipidemia    Hypertension    MRSA (methicillin resistant Staphylococcus aureus)    Reccurent MRSA abscesses   Type II diabetes mellitus ([place])    Review of Systems:  Review of Systems  Constitutional: Negative.   HENT: Negative.    Eyes: Negative.   Respiratory: Negative.    Cardiovascular: Negative.   Gastrointestinal: Negative.   Genitourinary: Negative.   Musculoskeletal: Negative.   Skin: Negative.   Neurological:  Positive for weakness.       (Improving)   Endo/Heme/Allergies: Negative.   Psychiatric/Behavioral: Negative.     Physical Exam:  Vitals:   11/30/20 1115 11/30/20 1205  BP: (!) 155/79 (!) 153/70  Pulse: 84 77  Temp: 98.3 F (36.8 C)   TempSrc: Oral   SpO2: 100%   Weight: 170 lb (77.1 kg)   Height: 5\' 3"  (1.6 m)    Physical Exam Constitutional:      Appearance: Normal appearance.  HENT:     Head: Normocephalic and atraumatic.  Eyes:     Extraocular Movements: Extraocular movements intact.     Pupils: Pupils are equal, round, and reactive to light.  Cardiovascular:     Rate and Rhythm: Normal rate and regular rhythm.     Heart sounds: No murmur heard.   No friction rub. No gallop.  Pulmonary:     Effort: Pulmonary effort is normal.     Breath sounds: Normal breath sounds. No wheezing, rhonchi or rales.  Abdominal:     General: Abdomen is flat. There is no distension.     Palpations: Abdomen is soft.     Tenderness: There is no abdominal tenderness.  Musculoskeletal:        General: Normal range of motion.  Skin:    General: Skin is warm  and dry.  Neurological:     General: No focal deficit present.     Mental Status: She is alert and oriented to person, place, and time. Mental status is at baseline.  Psychiatric:        Mood and Affect: Mood normal.        Behavior: Behavior normal.     Assessment & Plan:   See Encounters Tab for problem based charting.  Patient seen with Dr. Dareen Piano

## 2020-11-30 NOTE — Assessment & Plan Note (Addendum)
Pt presents today for a hospital follow-up.  Patient has been on losartan/HCTZ and amlodipine in the past.  These were discontinued at hospitalization in July due to orthostasis.   Today, systolic BP x2 in 803O.  Patient is asymptomatic at this time.  She states that she has a blood pressure cuff at home.  P: Restarted amlodipine 10 mg daily today.  Patient instructed to monitor blood pressure at home and contact us with any worsening symptoms.

## 2020-11-30 NOTE — Assessment & Plan Note (Signed)
The patient is here today for hospital follow-up.  Per the patient, she was recently started on Trulicity (before her hospitalization), however the patient thought that her Trulicity would be replacing her Lantus regimen.  The next month, last October, patient was hospitalized for DKA.  She reports that she has been doing well and denies any symptoms such as nausea/vomiting, abdominal pain, dizziness.  She states that she feels somewhat weak still, however this has much improved since she was hospitalized.  P: Patient seen by pharmacy earlier today to rediscuss her regimen.  Patient does not wish to continue Trulicity at this time.  However, patient provided glucose readings at home.  Results showed that she was only in her goal glucose range 16% of the time.  She has had many elevated readings ranging from 200- 600s in the morning.  Therefore, we increased her Lantus regimen to 35 units daily.  She will continue NovoLog 4 units 3 times daily with sliding scale.  Follow-up in 2 weeks with pharmacy visit.

## 2020-11-30 NOTE — Addendum Note (Signed)
Addended by: Orvis Brill on: 11/30/2020 01:23 PM   Modules accepted: Orders

## 2020-11-30 NOTE — Progress Notes (Signed)
Subjective:    Patient ID: Mandy Mullen, female    DOB: [birth year], 70 y.o.   MRN: [id number]  HPI Patient is a 70 y.o. female who presents for diabetes management. She is in good spirits and presents without assistance. Patient was referred on 08/21/20 and last seen by provider, Dr. Howie Ill, on 08/24/20. Last seen in pharmacy clinic on 09/22/20.  Insurance coverage/medication affordability: No insurance, only Part A, getting insulin for $4 from [facility] through the IM program.  Patient reports that she was hospitalized recently because the Trulicity caused her glucose to go too low and that they had trouble waking her and she almost did not wake up. She states this episode scared her and that she would not like to continue this medication.  Current diabetes medications include: insulin aspart (Novolog) sliding scale (< 180 - 0 units, 181-230 - 1 unit, 231-280 - 2 units, 281-330 - 3 units, 331-380 - 4 units, 381-430 - 5 units, 481-530 - 7 units, 531-58 - 8 units),  insulin glargine (Lantus) 30 units daily in the morning. Current hyperlipidemia medications include: rosuvastatin 20 mg daily Patient states that She is taking her medications as prescribed. Patient reports adherence with medications. Patient states that she misses her medications nearly never.  Do you feel that your medications are working for you?  yes  Have you been experiencing any side effects to the medications prescribed? no  Do you have any problems obtaining medications due to transportation or finances?  So far getting meds from the outpatient pharmacy for cheap, but worries could become expensive since no ins   Patient reported dietary habits:  Eats 3 meals/day and 1-2 snacks/day; Boluses with 3 meals/day. Breakfast:oatmeal Lunch:bowl of soup, leftover dinner, salad Dinner:starch, meat, vegetable Prefers to eat meals at home (eat out 4x a month) Snacks:fruit (plum, watermelon, grapes, peaches), peanut  butter or cheese crackers Drinks:water  Patient-reported exercise habits: has not been walking as she has still been recovering from her recent hospital admission   Patient denies hypoglycemic events. No hypoglycemic events since discharged from hospital in beginning of August Patient denies polyuria (increased urination).  Patient reports polyphagia (increased appetite).  Patient denies polydipsia (increased thirst).  Patient denies neuropathy (nerve pain). Patient reports visual changes in left eye (blurry) which has been ongoing for around 6 months Patient reports self foot exams.   Home fasting blood sugars: does not check 2 hour post-meal/random blood sugars: 228, 159, 278, 181, 374, 152, 137, 99, 136, 257, 324, 370, 262, 191, 204, 267, 176, 264, 56, 159, 389, 283, 171, 364  Objective:   Labs:   Physical Exam Constitutional:      Appearance: Normal appearance.  Neurological:     Mental Status: She is alert.  Psychiatric:        Mood and Affect: Mood normal.        Behavior: Behavior normal.    Review of Systems  All other systems reviewed and are negative.  Lab Results  Component Value Date   HGBA1C 11.9 (H) 11/14/2020   HGBA1C 12.9 (H) 07/27/2020   HGBA1C 13.6 (A) 03/12/2020    Lab Results  Component Value Date   MICRALBCREAT 5.8 07/26/2016    Lipid Panel     Component Value Date/Time   CHOL 248 (H) 03/12/2020 0948   TRIG 140 03/12/2020 0948   HDL 59 03/12/2020 0948   CHOLHDL 4.2 03/12/2020 0948   CHOLHDL 3.4 06/25/2013 1652   VLDL 26 06/25/2013 1652  [place] 164 (H) 03/12/2020 2924    Assessment/Plan:   T2DM is not controlled likely due to lack of understanding and concern for continued confusion surrounding medications. It is apparent that patient is not aware that she was hospitalized for DKA and elevated blood glucose. Spent time with patient discussing this and that Trulicity does not cause hyperglycemia. Stated cause of hyperglycemia was likely  due to patient discontinuing Lantus the day she started Trulicity and then not taking any for the subsequent 4 days leading up to admission. Patient also states she was not aware that she was supposed to take 6 units of Novolog and add the correct amount of units based on the sliding scale chart provided to her as she was only taking the corrective units therefor some days she was not taking any. She also reports she continued to do this post-prandially rather than checking and administering insulin prior to food consumption. Reminded patient importance of checking glucose BEFORE a meal and administering Novolog based on the reading before she eats. Also discussed the difference between Trulicity and Lantus. Patient not interested in restarting Trulicity at this time. Following instruction patient verbalized understanding of treatment plan.    Continue basal insulin Lantus (insulin glargine) 30 units and then titrate up based on fasting glucose readings at next visit Adjusted dose of rapid insulin Novolog (insulin aspart) to 4 units plus the appropriate additional amount based on sliding scale provided. Patient was instructed to inject Novolog (insulin aspart) 30 minutes before her meal, instead of after. Also reminded patient to start checking blood sugar in the morning right after waking up. Next A1C anticipated in January 2023  Follow-up appointment  two weeks to review sugar readings. Written patient instructions provided.  This appointment required 45 minutes of direct patient care.  Thank you for involving pharmacy to assist in providing this patient's care.

## 2020-11-30 NOTE — Assessment & Plan Note (Addendum)
Continue rosuvastatin 20 mg daily. 

## 2020-11-30 NOTE — Assessment & Plan Note (Addendum)
We will obtain repeat BMP today to monitor her kidney function.

## 2020-12-01 LAB — BMP8+ANION GAP
Anion Gap: 14 mmol/L (ref 10.0–18.0)
BUN/Creatinine Ratio: 23 (ref 12–28)
BUN: 20 mg/dL (ref 8–27)
CO2: 24 mmol/L (ref 20–29)
Calcium: 9.2 mg/dL (ref 8.7–10.3)
Chloride: 102 mmol/L (ref 96–106)
Creatinine, Ser: 0.87 mg/dL (ref 0.57–1.00)
Glucose: 151 mg/dL — ABNORMAL HIGH (ref 70–99)
Potassium: 4.5 mmol/L (ref 3.5–5.2)
Sodium: 140 mmol/L (ref 134–144)
eGFR: 72 mL/min/{1.73_m2} (ref >59)

## 2020-12-04 NOTE — Progress Notes (Signed)
Evaluation and management procedures were performed by the Clinical Pharmacy Practitioner under my supervision and collaboration. I have reviewed the Practitioner's note and chart, and I agree with the management and plan as documented above. ° °

## 2020-12-17 ENCOUNTER — Telehealth: Payer: Self-pay | Admitting: Licensed Clinical Social Worker

## 2020-12-17 ENCOUNTER — Ambulatory Visit (INDEPENDENT_AMBULATORY_CARE_PROVIDER_SITE_OTHER): Payer: Self-pay | Admitting: Pharmacist

## 2020-12-17 ENCOUNTER — Other Ambulatory Visit (HOSPITAL_COMMUNITY): Payer: Self-pay

## 2020-12-17 DIAGNOSIS — Z794 Long term (current) use of insulin: Secondary | ICD-10-CM

## 2020-12-17 DIAGNOSIS — N183 Chronic kidney disease, stage 3 unspecified: Secondary | ICD-10-CM

## 2020-12-17 DIAGNOSIS — E1122 Type 2 diabetes mellitus with diabetic chronic kidney disease: Secondary | ICD-10-CM

## 2020-12-17 MED ORDER — INSULIN ASPART 100 UNIT/ML FLEXPEN
4.0000 [IU] | PEN_INJECTOR | Freq: Three times a day (TID) | SUBCUTANEOUS | 2 refills | Status: DC
  Filled 2020-12-17: qty 9, 25d supply, fill #0
  Filled 2021-02-19: qty 9, 25d supply, fill #1
  Filled 2021-04-14: qty 9, 25d supply, fill #2
  Filled 2021-08-27: qty 9, 25d supply, fill #3
  Filled 2021-10-05: qty 9, 25d supply, fill #4

## 2020-12-17 NOTE — Patient Instructions (Signed)
Miss Scarfo it was a pleasure seeing you today.   Please do the following:  Continue taking all of your insulin the same way you currently are and please re-start Trulicity once weekly as directed today during your appointment. If you have any questions or if you believe something has occurred because of this change, call me or your doctor to let one of [name] know.  Continue checking blood sugars at home. It's really important that you record these and bring these in to your next doctor's appointment.  Continue making the lifestyle changes we've discussed together during our visit. Diet and exercise play a significant role in improving your blood sugars.  Follow-up with me in two weeks.    Hypoglycemia or low blood sugar:   Low blood sugar can happen quickly and may become an emergency if not treated right away.   While this shouldn't happen often, it can be brought upon if you skip a meal or do not eat enough. Also, if your insulin or other diabetes medications are dosed too high, this can cause your blood sugar to go to low.   Warning signs of low blood sugar include: Feeling shaky or dizzy Feeling weak or tired  Excessive hunger Feeling anxious or upset  Sweating even when you aren't exercising  What to do if I experience low blood sugar? Follow the Rule of 15 Check your blood sugar with your meter. If lower than 70, proceed to step 2.  Treat with 15 grams of fast acting carbs which is found in 3-4 glucose tablets. If none are available you can try hard candy, 1 tablespoon of sugar or honey,4 ounces of fruit juice, or 6 ounces of REGULAR soda.  Re-check your sugar in 15 minutes. If it is still below 70, do what you did in step 2 again. If your blood sugar has come back up, go ahead and eat a snack or small meal made up of complex carbs (ex. Whole grains) and protein at this time to avoid recurrence of low blood sugar.

## 2020-12-17 NOTE — Telephone Encounter (Signed)
  Care Management   Follow Up Note   12/17/2020 Name: SHENIECE RUGGLES MRN: [id number] DOB: 09/06/1950   Referred by: Lucious Groves, DO Reason for referral : No chief complaint on file.   An unsuccessful telephone outreach was attempted today. The patient was referred to the case management team for assistance with care management and care coordination.   Follow Up Plan: The care management team will reach out to the patient again over the next 60 days.   [name], [place]  Social Worker [facility] Care Management  [phone]

## 2020-12-17 NOTE — Assessment & Plan Note (Signed)
T2DM is not controlled likely due to lack of understanding and concern for continued confusion surrounding medications. Medications appear to be taken appropriately at this time. Patient amendable to adding back on Trulicity 0.75mg  once weekly to help improve blood glucose. Will likely need to increase insulin in the future, but will start with Trulicity first as patient does not do well with multiple medication changes at once. Patient educated on purpose, proper use and potential adverse effects of Trulicity. Following instruction patient verbalized understanding of treatment plan.   1. Continue basal insulin Lantus (insulin glargine) 30 units and then titrate up based on fasting glucose readings at next visit 2. Continued rapid insulin Novolog (insulin aspart) to 4 units plus the appropriate additional amount based on sliding scale provided. Patient was instructed to inject Novolog (insulin aspart) 30 minutes before her meal, instead of after. 3. Re-started Trulicity 0.75mg  once weekly 4. Also reminded patient to start checking blood sugar in the morning right after waking up. 5. Next A1C anticipated in January 2023  Follow-up appointment two weeks to review sugar readings.

## 2020-12-17 NOTE — Progress Notes (Signed)
Subjective:    Patient ID: Mandy Mullen, female    DOB: [birth year], 70 y.o.   MRN: [id number]  HPI Patient is a 70 y.o. female who presents for diabetes management. She is in good spirits and presents without assistance. Patient was referred on 08/21/20 and last seen by provider, Dr. Lorin Glass, and in pharmacy clinic on 11/30/20.  Insurance coverage/medication affordability: No insurance, only Part A, getting insulin for $4 from [facility] through the IM program.  Patient has history of confusion surrounding medications and correct dosing, but reports correct insulin dosing today based on our previous instructions at last visit. Patient did not bring glucometer to confirm readings.  Current diabetes medications include: insulin aspart (Novolog) sliding scale (< 180 - 0 units, 181-230 - 1 unit, 231-280 - 2 units, 281-330 - 3 units, 331-380 - 4 units, 381-430 - 5 units, 481-530 - 7 units, 531-58 - 8 units),  insulin glargine (Lantus) 30 units daily in the morning. Current hyperlipidemia medications include: rosuvastatin 20 mg daily Patient states that She is taking her medications as prescribed. Patient reports adherence with medications. Patient states that she misses her medications nearly never.  Do you feel that your medications are working for you?  yes  Have you been experiencing any side effects to the medications prescribed? no  Do you have any problems obtaining medications due to transportation or finances?  So far getting meds from the outpatient pharmacy for cheap, but worries could become expensive since no insurance   Patient reported dietary habits:  Eats 3 meals/day and 1-2 snacks/day; Boluses with 3 meals/day. Breakfast:oatmeal Lunch:bowl of soup, leftover dinner, salad Dinner:starch, meat, vegetable Prefers to eat meals at home (eat out 4x a month) Snacks:fruit (plum, watermelon, grapes, peaches), peanut butter or cheese  crackers Drinks:water  Patient-reported exercise habits: has not been walking as she has still been recovering from her recent hospital admission   Patient denies hypoglycemic events. Patient denies polyuria (increased urination).  Patient denies polyphagia (increased appetite).  Patient denies polydipsia (increased thirst).  Patient denies neuropathy (nerve pain). Patient reports visual changes in left eye (blurry) which has been ongoing for around 6 months Patient reports self foot exams.   Home fasting blood sugars: 180-190's 2 hour post-meal/random blood sugars: 250's  Objective:   Labs:   Physical Exam Constitutional:      Appearance: Normal appearance.  Neurological:     Mental Status: She is alert.  Psychiatric:        Mood and Affect: Mood normal.        Behavior: Behavior normal.    Review of Systems  All other systems reviewed and are negative.  Lab Results  Component Value Date   HGBA1C 11.9 (H) 11/14/2020   HGBA1C 12.9 (H) 07/27/2020   HGBA1C 13.6 (A) 03/12/2020    Lab Results  Component Value Date   MICRALBCREAT 5.8 07/26/2016    Lipid Panel     Component Value Date/Time   CHOL 248 (H) 03/12/2020 0948   TRIG 140 03/12/2020 0948   HDL 59 03/12/2020 0948   CHOLHDL 4.2 03/12/2020 0948   CHOLHDL 3.4 06/25/2013 1652   VLDL 26 06/25/2013 1652   LDLCALC 164 (H) 03/12/2020 0948    Assessment/Plan:   T2DM is not controlled likely due to lack of understanding and concern for continued confusion surrounding medications. Medications appear to be taken appropriately at this time. Patient amendable to adding back on Trulicity 0.75mg  once weekly to help improve blood glucose.  Will likely need to increase insulin in the future, but will start with Trulicity first as patient does not do well with multiple medication changes at once. Patient educated on purpose, proper use and potential adverse effects of Trulicity. Following instruction patient verbalized  understanding of treatment plan.   Continue basal insulin Lantus (insulin glargine) 30 units and then titrate up based on fasting glucose readings at next visit Continued rapid insulin Novolog (insulin aspart) to 4 units plus the appropriate additional amount based on sliding scale provided. Patient was instructed to inject Novolog (insulin aspart) 30 minutes before her meal, instead of after. Re-started Trulicity 0.75mg  once weekly Also reminded patient to start checking blood sugar in the morning right after waking up. Next A1C anticipated in January 2023  Follow-up appointment two weeks to review sugar readings. Written patient instructions provided.  This appointment required 30 minutes of direct patient care.  Thank you for involving pharmacy to assist in providing this patient's care.

## 2020-12-18 ENCOUNTER — Other Ambulatory Visit (HOSPITAL_COMMUNITY): Payer: Self-pay

## 2020-12-18 NOTE — Progress Notes (Signed)
Evaluation and management procedures were performed by the Clinical Pharmacy Practitioner under my supervision and collaboration. I have reviewed the Practitioner's note and chart, and I agree with the management and plan as documented above. ° °

## 2020-12-31 ENCOUNTER — Encounter: Payer: Self-pay | Admitting: Pharmacist

## 2021-01-01 ENCOUNTER — Other Ambulatory Visit (HOSPITAL_COMMUNITY): Payer: Self-pay

## 2021-01-12 ENCOUNTER — Other Ambulatory Visit (HOSPITAL_COMMUNITY): Payer: Self-pay

## 2021-01-19 ENCOUNTER — Other Ambulatory Visit: Payer: Self-pay | Admitting: Internal Medicine

## 2021-01-19 ENCOUNTER — Other Ambulatory Visit (HOSPITAL_COMMUNITY): Payer: Self-pay

## 2021-01-19 MED ORDER — LANTUS SOLOSTAR 100 UNIT/ML ~~LOC~~ SOPN
35.0000 [IU] | PEN_INJECTOR | Freq: Every day | SUBCUTANEOUS | 0 refills | Status: DC
  Filled 2021-01-19: qty 9, 25d supply, fill #0
  Filled 2021-02-19: qty 6, 17d supply, fill #1

## 2021-01-19 NOTE — Telephone Encounter (Signed)
Error

## 2021-02-04 ENCOUNTER — Other Ambulatory Visit (HOSPITAL_COMMUNITY): Payer: Self-pay

## 2021-02-19 ENCOUNTER — Other Ambulatory Visit (HOSPITAL_COMMUNITY): Payer: Self-pay

## 2021-03-11 ENCOUNTER — Other Ambulatory Visit: Payer: Self-pay

## 2021-03-11 ENCOUNTER — Other Ambulatory Visit (HOSPITAL_COMMUNITY): Payer: Self-pay

## 2021-03-11 ENCOUNTER — Other Ambulatory Visit: Payer: Self-pay | Admitting: *Deleted

## 2021-03-12 ENCOUNTER — Other Ambulatory Visit (HOSPITAL_COMMUNITY): Payer: Self-pay

## 2021-03-12 ENCOUNTER — Other Ambulatory Visit: Payer: Self-pay | Admitting: Internal Medicine

## 2021-03-12 MED ORDER — LANTUS SOLOSTAR 100 UNIT/ML ~~LOC~~ SOPN
35.0000 [IU] | PEN_INJECTOR | Freq: Every day | SUBCUTANEOUS | 1 refills | Status: DC
  Filled 2021-03-12: qty 9, 25d supply, fill #0
  Filled 2021-04-13: qty 9, 25d supply, fill #1
  Filled 2021-05-24: qty 9, 25d supply, fill #2
  Filled 2021-06-16: qty 3, 8d supply, fill #3
  Filled 2021-07-12: qty 3, 8d supply, fill #4

## 2021-03-12 NOTE — Telephone Encounter (Signed)
Needs follow up appointment with me to recheck A1c

## 2021-03-22 ENCOUNTER — Other Ambulatory Visit (HOSPITAL_COMMUNITY): Payer: Self-pay

## 2021-04-13 ENCOUNTER — Other Ambulatory Visit (HOSPITAL_COMMUNITY): Payer: Self-pay

## 2021-04-14 ENCOUNTER — Other Ambulatory Visit (HOSPITAL_COMMUNITY): Payer: Self-pay

## 2021-04-23 ENCOUNTER — Other Ambulatory Visit (HOSPITAL_COMMUNITY): Payer: Self-pay

## 2021-05-05 ENCOUNTER — Ambulatory Visit (INDEPENDENT_AMBULATORY_CARE_PROVIDER_SITE_OTHER): Payer: Self-pay | Admitting: Internal Medicine

## 2021-05-05 ENCOUNTER — Telehealth: Payer: Self-pay | Admitting: *Deleted

## 2021-05-05 ENCOUNTER — Other Ambulatory Visit (HOSPITAL_COMMUNITY): Payer: Self-pay

## 2021-05-05 VITALS — BP 147/67 | HR 96 | Temp 98.6°F | Wt 162.6 lb

## 2021-05-05 DIAGNOSIS — E11649 Type 2 diabetes mellitus with hypoglycemia without coma: Secondary | ICD-10-CM

## 2021-05-05 DIAGNOSIS — Z5989 Other problems related to housing and economic circumstances: Secondary | ICD-10-CM

## 2021-05-05 DIAGNOSIS — Z794 Long term (current) use of insulin: Secondary | ICD-10-CM

## 2021-05-05 DIAGNOSIS — E1122 Type 2 diabetes mellitus with diabetic chronic kidney disease: Secondary | ICD-10-CM

## 2021-05-05 DIAGNOSIS — N183 Chronic kidney disease, stage 3 unspecified: Secondary | ICD-10-CM

## 2021-05-05 DIAGNOSIS — M1990 Unspecified osteoarthritis, unspecified site: Secondary | ICD-10-CM | POA: Insufficient documentation

## 2021-05-05 DIAGNOSIS — M159 Polyosteoarthritis, unspecified: Secondary | ICD-10-CM

## 2021-05-05 LAB — GLUCOSE, CAPILLARY: Glucose-Capillary: 360 mg/dL — ABNORMAL HIGH (ref 70–99)

## 2021-05-05 LAB — POCT GLYCOSYLATED HEMOGLOBIN (HGB A1C): Hemoglobin A1C: 10.3 % — AB (ref 4.0–5.6)

## 2021-05-05 MED ORDER — DICLOFENAC SODIUM 1 % EX GEL
2.0000 g | Freq: Four times a day (QID) | CUTANEOUS | 1 refills | Status: DC
  Filled 2021-05-05: qty 100, 20d supply, fill #0

## 2021-05-05 MED ORDER — NAPROXEN 500 MG PO TABS
500.0000 mg | ORAL_TABLET | Freq: Two times a day (BID) | ORAL | 0 refills | Status: DC
  Filled 2021-05-05: qty 14, 7d supply, fill #0

## 2021-05-05 MED ORDER — DICLOFENAC SODIUM 1 % EX GEL
2.0000 g | Freq: Four times a day (QID) | CUTANEOUS | 0 refills | Status: DC
  Filled 2021-05-05: qty 100, fill #0

## 2021-05-05 MED ORDER — TRULICITY 0.75 MG/0.5ML ~~LOC~~ SOAJ
1.5000 mg | SUBCUTANEOUS | 5 refills | Status: DC
  Filled 2021-05-05: qty 2, 28d supply, fill #0

## 2021-05-05 MED ORDER — TRULICITY 1.5 MG/0.5ML ~~LOC~~ SOAJ: 1.5000 mg | SUBCUTANEOUS | 0 refills | Status: DC

## 2021-05-05 MED ORDER — EMPAGLIFLOZIN 10 MG PO TABS
10.0000 mg | ORAL_TABLET | Freq: Every day | ORAL | 2 refills | Status: DC
  Filled 2021-05-05: qty 30, 30d supply, fill #0

## 2021-05-05 MED ORDER — NAPROXEN 500 MG PO TABS
500.0000 mg | ORAL_TABLET | Freq: Two times a day (BID) | ORAL | 0 refills | Status: AC
  Filled 2021-05-05: qty 14, 7d supply, fill #0

## 2021-05-05 MED ORDER — TRULICITY 0.75 MG/0.5ML ~~LOC~~ SOAJ
1.5000 mg | SUBCUTANEOUS | 5 refills | Status: DC
  Filled 2021-05-05: qty 2, 14d supply, fill #0

## 2021-05-05 MED ORDER — EMPAGLIFLOZIN 10 MG PO TABS
10.0000 mg | ORAL_TABLET | Freq: Every day | ORAL | 2 refills | Status: DC
  Filled 2021-05-05: qty 30, 30d supply, fill #0
  Filled 2021-06-16: qty 30, 30d supply, fill #1
  Filled 2021-07-19: qty 30, 30d supply, fill #2

## 2021-05-05 MED ORDER — TRULICITY 0.75 MG/0.5ML ~~LOC~~ SOAJ: 1.5000 mg | SUBCUTANEOUS | 5 refills | Status: DC

## 2021-05-05 NOTE — Progress Notes (Signed)
? ?CC: left leg pain and DM follow up ? ?HPI: ? ?[name] is a 71 y.o. female with hypertension, diabetes, and CKD 3 who presents to the [facility] for left leg pain and DM follow up. Please see problem-based list for further details, assessments, and plans. ? ? ?Past Medical History:  ?Diagnosis Date  ? History of blood transfusion 1991  ? "related to hysterectomy/left ovary OR" (11/28/2017)  ? History of herpes genitalis   ? Hyperlipidemia   ? Hypertension   ? MRSA (methicillin resistant Staphylococcus aureus)   ? Reccurent MRSA abscesses  ? Type II diabetes mellitus ([place])   ? ?Review of Systems:  Review of Systems  ?Constitutional:  Negative for chills and fever.  ?HENT: Negative.    ?Respiratory:  Negative for cough and shortness of breath.   ?Cardiovascular:  Negative for chest pain and leg swelling.  ?Genitourinary:  Negative for dysuria and hematuria.  ?Musculoskeletal:  Positive for joint pain. Negative for back pain, falls and neck pain.  ?Neurological:  Negative for dizziness, tingling, sensory change, weakness and headaches.  ? ? ?Physical Exam: ? ?Vitals:  ? 05/05/21 1004  ?BP: (!) 147/67  ?Pulse: 96  ?Temp: 98.6 ?F (37 ?C)  ?TempSrc: Oral  ?SpO2: 96%  ?Weight: 162 lb 9.6 oz (73.8 kg)  ? ?General: Pleasant, well-appearing female. No acute distress. ?CV: RRR. No murmurs, rubs, or gallops. No LE edema ?Pulmonary: Lungs CTAB. Normal effort.  ?Abdominal: Soft, nontender, nondistended.  ?Extremities: Palpable radial and DP pulses. Normal ROM. Negative Lachmans test. No palpable effusion in left knee.  ?Skin: Warm and dry.  ?Neuro: A&Ox3. No focal deficit. ?Psych: Normal mood and affect ? ? ?Assessment & Plan:  ? ?See Encounters Tab for problem based charting. ? ?Patient discussed with Dr. Dareen Piano ? ?Type 2 diabetes mellitus ([place]) ?A1c 11.9% In October and 10.3% today.  Patient has been taking Lantus 30 units daily, sliding scale with NovoLog 3 times daily, and Trulicity 6.19 mg weekly.  Patient states  that she checks her sugars multiple times a day because of her sliding scale and notes that in the mornings her sugars are in the mid 100s.  She states that in the morning she feels as if her sugars are too low which makes her feel poorly. Discussed that her A1c is still poorly controlled and that we will need to make adjustments to her medication regimen today. ? ?Plan: ?- Continue Lantus 30u daily  ?- Continue sliding scale insulin ?- Increase Trulicity to 1.5 mg once weekly ?- Start Jardiance 10 mg daily (IM program) ?- Next A1c July 2023 ? ?Osteoarthritis ?Patient presents today primarily to discuss isolated left hip and left knee pain that has been ongoing for the past few weeks.  She states that this pain has been coming out of nowhere and she denies any recent falls, injuries, or any other trauma.  The patient denies any tingling, sensory change, or weakness and just notes that the pain feels like a throbbing sensation.  ? ?On exam, patient has normal range of motion of her knee and hip joints.  She has no palpable effusion in her left knee however, upon extension and flexion of her left knee there is audible clicking of the joint.  Negative Lachman's test.  Of note, the patient has never had a DEXA scan as she does not have any insurance.  I suspect she may have some degree of osteoarthritis, however we will not obtain imaging as she does not  have any form of financial assistance/insurance at this time. ? ?Plan: ?- Referral to social work for insurance assistance ?- Naproxen 500 mg bid x 7 days ?- Advised to use heating pad and voltaren gel ?- Follow up in 1 week (could consider steroid injection at that time) ? ? ?

## 2021-05-05 NOTE — Addendum Note (Signed)
Addended by: Buddy Duty on: 05/05/2021 01:24 PM ? ? Modules accepted: Orders ? ?

## 2021-05-05 NOTE — Patient Instructions (Signed)
Thank you, [name] for allowing [name] to provide your care today. Today we discussed: ? ?Diabetes: Continue to use 30 units of lantus each day and continue to use your sliding scale 3 times a day. Increase your Trulicity to 1.5 mg per week. We are also starting a medication called jardiance (take 1 tablet each day). We will recheck your A1c in 3 months. ? ?Leg pain: Take naproxen twice a day for a week. You can use a heating pad on your low back/hip area and you can also use voltaren gel there and on your knee to help the pain. If your pain is not better in 1 week when we see you again on Wednesday, we can consider doing a steroid injection to help ? ?I have ordered the following labs for you: ? ? ?Lab Orders    ?     Glucose, capillary    ?     POC Hbg A1C     ? ?Tests ordered today: ? ?none ? ?Referrals ordered today:  ? ? ?Referral Orders    ?     AMB Referral to [facility]    ?     Referral to Nutrition and Diabetes Services     ? ?I have ordered the following medication/changed the following medications:  ? ?Stop the following medications: ?Medications Discontinued During This Encounter  ?Medication Reason  ? Dulaglutide (TRULICITY) 9.40 HW/8.0SU SOPN Reorder  ?  ? ?Start the following medications: ?Meds ordered this encounter  ?Medications  ? Dulaglutide (TRULICITY) 1.10 RP/5.9YV SOPN  ?  Sig: Inject 1.5 mg into the skin once a week. This is a patient assistance medication. Patient may not be approved and/or have medication. Please ask and verify when performing med review.  ?  Dispense:  2 mL  ?  Refill:  5  ?  This is a patient assistance medication. Patient may not be approved and/or have medication. Please ask and verify when performing med review.  ? empagliflozin (JARDIANCE) 10 MG TABS tablet  ?  Sig: Take 1 tablet (10 mg total) by mouth daily before breakfast.  ?  Dispense:  30 tablet  ?  Refill:  2  ?  IM program  ? diclofenac Sodium (VOLTAREN) 1 % GEL  ?  Sig: Apply 2 g  topically 4 (four) times daily.  ?  Dispense:  100 g  ?  Refill:  0  ? naproxen (NAPROSYN) 500 MG tablet  ?  Sig: Take 1 tablet (500 mg total) by mouth 2 (two) times daily with a meal for 7 days.  ?  Dispense:  14 tablet  ?  Refill:  0  ?  ? ?Follow up:  1 week (Wednesday afternoon) for leg pain   ? ? ?Remember: to take your naproxen with meals ? ?Should you have any questions or concerns please call the internal medicine clinic at [phone].   ? ? ?Buddy Duty, D.O. ?[place] ? ? ?

## 2021-05-05 NOTE — Assessment & Plan Note (Addendum)
A1c 11.9% In October and 10.3% today.  Patient has been taking Lantus 30 units daily, sliding scale with NovoLog 3 times daily, and Trulicity 9.74 mg weekly.  Patient states that she checks her sugars multiple times a day because of her sliding scale and notes that in the mornings her sugars are in the mid 100s.  She states that in the morning she feels as if her sugars are too low which makes her feel poorly. Discussed that her A1c is still poorly controlled and that we will need to make adjustments to her medication regimen today. ? ?Plan: ?- Continue Lantus 30u daily  ?- Continue sliding scale insulin ?- Increase Trulicity to 1.5 mg once weekly ?- Start Jardiance 10 mg daily (IM program) ?- Next A1c July 2023 ?

## 2021-05-05 NOTE — Assessment & Plan Note (Addendum)
Patient presents today primarily to discuss isolated left hip and left knee pain that has been ongoing for the past few weeks.  She states that this pain has been coming out of nowhere and she denies any recent falls, injuries, or any other trauma.  The patient denies any tingling, sensory change, or weakness and just notes that the pain feels like a throbbing sensation.  ? ?On exam, patient has normal range of motion of her knee and hip joints.  She has no palpable effusion in her left knee however, upon extension and flexion of her left knee there is audible clicking of the joint.  Negative Lachman's test.  Of note, the patient has never had a DEXA scan as she does not have any insurance.  I suspect she may have some degree of osteoarthritis, however we will not obtain imaging as she does not have any form of financial assistance/insurance at this time. ? ?Plan: ?- Referral to social work for insurance assistance ?- Naproxen 500 mg bid x 7 days ?- Advised to use heating pad and voltaren gel ?- Follow up in 1 week (could consider steroid injection at that time) ?

## 2021-05-05 NOTE — Chronic Care Management (AMB) (Signed)
?  Care Management  ? ?Outreach Note ? ?05/05/2021 ?Name: Mandy Mullen MRN: [id number] DOB: [birth year] ? ?Referred by: Lucious Groves, DO ?Reason for referral : Care Coordination (Outreach to schedule referral with BSW) ? ? ?An unsuccessful telephone outreach was attempted today. The patient was referred to the case management team for assistance with care management and care coordination.  ? ?Follow Up Plan:  ?The care management team will reach out to the patient again over the next 7 days.  ?If patient returns call to provider office, please advise to call [place]* at [phone].* ? ?Laverda Sorenson  ?Care Guide, Embedded Care Coordination ?[facility]  Care Management  ?Direct Dial: [phone] ? ?

## 2021-05-06 NOTE — Progress Notes (Signed)
Internal Medicine Clinic Attending ? ?Case discussed with Dr. Raymondo Band  At the time of the visit.  We reviewed the resident?s history and exam and pertinent patient test results.  I agree with the assessment, diagnosis, and plan of care documented in the resident?s note. ? 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 Would benefit from a steroid injection if no improvement with NSAIDs ? ? ?

## 2021-05-12 ENCOUNTER — Telehealth: Payer: Self-pay

## 2021-05-12 NOTE — Telephone Encounter (Signed)
? ?  Telephone encounter was:  Unsuccessful.  05/12/2021 ?Name: Mandy Mullen MRN: [id number] DOB: Aug 31, 1950 ? ?Unsuccessful outbound call made today to assist with:   health insurance information. ? ?Outreach Attempt:  1st Attempt ? ?Unable to leave message voicemail does not  pick up. ? ?Dymond Gutt, AAS Paralegal, CHC ?Care Guide  Embedded Care Coordination ?[facility]  [facility]  ?[address] ?[place], [place] [address] ?[email or web address]  ?? [phone]   ?[email or web address] ?  ?

## 2021-05-13 NOTE — Chronic Care Management (AMB) (Signed)
?  Care Management  ? ?Note ? ?05/13/2021 ?Name: Mandy Mullen MRN: [id number] DOB: December 06, 1950 ? ?Mandy Mullen is a 71 y.o. year old female who is a primary care patient of Lucious Groves, DO and is actively engaged with the care management team. I reached out to [name] by phone today to assist with scheduling an initial visit with the BSW ? ?Follow up plan: ?Unsuccessful telephone outreach attempt made. A HIPAA compliant phone message was left for the patient providing contact information and requesting a return call.  ?The care management team will reach out to the patient again over the next 7 days.  ?If patient returns call to provider office, please advise to call [place]  at [phone] ? ?Laverda Sorenson  ?Care Guide, Embedded Care Coordination ?[facility]  Care Management  ?Direct Dial: [phone] ? ?

## 2021-05-17 NOTE — Chronic Care Management (AMB) (Signed)
?  Care Management  ? ?Outreach Note ? ?05/17/2021 ?Name: Mandy Mullen MRN: [id number] DOB: 22-Jun-1950 ? ?Referred by: Lucious Groves, DO ?Reason for referral : Care Coordination (Outreach to schedule referral with BSW) ? ? ?Third unsuccessful telephone outreach was attempted today. The patient was referred to the case management team for assistance with care management and care coordination. The patient's primary care provider has been notified of our unsuccessful attempts to make or maintain contact with the patient. The care management team is pleased to engage with this patient at any time in the future should he/she be interested in assistance from the care management team.  ? ?Follow Up Plan:  ?We have been unable to make contact with the patient for follow up. The care management team is available to follow up with the patient after provider conversation with the patient regarding recommendation for care management engagement and subsequent re-referral to the care management team.  ?If patient returns call to provider office, please advise to call [place] * at [phone].* ? ?Laverda Sorenson  ?Care Guide, Embedded Care Coordination ?[facility]  Care Management  ?Direct Dial: [phone] ? ?

## 2021-05-19 ENCOUNTER — Telehealth: Payer: Self-pay

## 2021-05-19 NOTE — Telephone Encounter (Signed)
? ?  Telephone encounter was:  Unsuccessful.  05/19/2021 ?Name: Mandy Mullen MRN: [id number] DOB: 28-Dec-1950 ? ?Unsuccessful outbound call made today to assist with:  Financial Difficulties related to health insurance information. ? ?Outreach Attempt:  2nd Attempt ? ?Unable to leave message voicemail does not  pick up. ? ?Ricahrd Schwager, AAS Paralegal, CHC ?Care Guide  Embedded Care Coordination ?[facility]  [facility] Management  ?[address] ?[place], [place] [address] ?[email or web address]  ?? [phone]   ?[email or web address] ?  ?

## 2021-05-21 ENCOUNTER — Telehealth: Payer: Self-pay

## 2021-05-21 NOTE — Telephone Encounter (Signed)
? ?  Telephone encounter was:  Successful.  ?05/21/2021 ?Name: LASHAWNNA BACHELLER MRN: [id number] DOB: 1950/11/30 ? ?BETTEY PALMATIER is a 71 y.o. year old female who is a primary care patient of Gust Rung, DO . The community resource team was consulted for assistance with  health insurance information. ? ?Care guide performed the following interventions: Spoke with patient verified email address [email or web address]. Sent information for affordable care navigator assistance, senior health insurance information program, medicaid and gccn orange card. Emailed letter saved in [place]. ? ?Follow Up Plan:  Care guide will follow up with patient by phone over the next 7-10 days. ? ?Regie Bunner, AAS Paralegal, CHC ?Care Guide  Embedded Care Coordination ?[facility]  [facility]  ?[address] ?[place], [place] [address] ?[email or web address]  ?? [phone]   ?[email or web address] ?  ?

## 2021-05-24 ENCOUNTER — Other Ambulatory Visit (HOSPITAL_COMMUNITY): Payer: Self-pay

## 2021-05-26 ENCOUNTER — Encounter: Payer: Self-pay | Admitting: Internal Medicine

## 2021-05-26 ENCOUNTER — Ambulatory Visit (INDEPENDENT_AMBULATORY_CARE_PROVIDER_SITE_OTHER): Payer: Self-pay | Admitting: Internal Medicine

## 2021-05-26 ENCOUNTER — Other Ambulatory Visit (HOSPITAL_COMMUNITY): Payer: Self-pay

## 2021-05-26 DIAGNOSIS — M159 Polyosteoarthritis, unspecified: Secondary | ICD-10-CM

## 2021-05-26 MED ORDER — DICLOFENAC SODIUM 1 % EX GEL
2.0000 g | Freq: Four times a day (QID) | CUTANEOUS | 1 refills | Status: DC
  Filled 2021-05-26: qty 100, 13d supply, fill #0

## 2021-05-26 MED ORDER — IBUPROFEN 400 MG PO TABS
400.0000 mg | ORAL_TABLET | Freq: Three times a day (TID) | ORAL | 0 refills | Status: AC | PRN
  Filled 2021-05-26: qty 30, 10d supply, fill #0

## 2021-05-26 NOTE — Patient Instructions (Addendum)
Mandy Mullen, ? ?It was a pleasure meeting you today. Today we discussed your ongoing hip pain. Unfortunately, we are unable to do an injection today, due to your blood sugars being uncontrolled. The risk would outweigh the benefits. We will continue with conservative management, which will include Advil and reapplying your Voltaren gel. Please take the Advil with food, and only use it when your pain is at its worst. Please do not use it more than three days in a row.  ? ?We will have you come back in 4 weeks for reevaluation.  ?Have a good day,  ?[name], MD ?

## 2021-05-27 NOTE — Assessment & Plan Note (Addendum)
Patient presents for follow-up on her left hip pain which she describes as throbbing, and has difficulty sleeping on it at night.  She states that the medications that she has been using has helped minimally, but using several different paste including IcyHot have helped her right hip pain.  She states that she places on the lateral side of her hip.  She is curious if we could do an injection today. ? ?A/P: ?Patient presents for follow-up on her hip pain.  Physical examination today more consistent with greater trochanteric pain syndrome.  She is tender to palpation over the lateral aspect of her hip.  She is FABER positive but FADIR negative., she has full range of motion of the affected hip, compression testing is negative.  Unfortunately, given her A1c of 10.7, corticosteroid junction is contraindicated with her uncontrolled diabetes.  We will continue symptomatic management. ?- Refill Voltaren gel 1% 2 g 4 times daily as needed for pain ?- Advil 400 mg 3 times daily as needed for particularly painful days, not to exceed 3 days of use in a row ?- Follow-up in 4 weeks for further evaluation ?

## 2021-05-27 NOTE — Progress Notes (Signed)
? ?  CC: Leg pain follow-up ? ?HPI: ? ?[name] is a 71 y.o. person, with a PMH noted below, who presents to the clinic leg pain follow-up. To see the management of their acute and chronic conditions, please see the A&P note under the Encounters tab.  ? ?Past Medical History:  ?Diagnosis Date  ? History of blood transfusion 1991  ? "related to hysterectomy/left ovary OR" (11/28/2017)  ? History of herpes genitalis   ? Hyperlipidemia   ? Hypertension   ? MRSA (methicillin resistant Staphylococcus aureus)   ? Reccurent MRSA abscesses  ? Type II diabetes mellitus ([place])   ? ?Review of Systems:   ?Review of Systems  ?Constitutional:  Negative for chills, fever, malaise/fatigue and weight loss.  ?Cardiovascular:  Negative for chest pain, palpitations, orthopnea and leg swelling.  ?Gastrointestinal:  Negative for nausea and vomiting.  ?Musculoskeletal:  Positive for joint pain.  ?     Hip pain   ? ?Physical Exam: ? ?Vitals:  ? 05/26/21 1358 05/26/21 1418  ?BP: (!) 145/69 (!) 150/78  ?Pulse: 88 90  ?Temp: 98.6 ?F (37 ?C)   ?TempSrc: Oral   ?SpO2: 100%   ?Weight: 158 lb 6.4 oz (71.8 kg)   ?Height: '5\' 3"'$  (1.6 m)   ? ?Physical Exam ?Constitutional:   ?   General: She is not in acute distress. ?   Appearance: Normal appearance. She is not ill-appearing, toxic-appearing or diaphoretic.  ?Cardiovascular:  ?   Rate and Rhythm: Normal rate and regular rhythm.  ?   Pulses: Normal pulses.  ?   Heart sounds: Normal heart sounds. No murmur heard. ?  No friction rub. No gallop.  ?Musculoskeletal:  ?   Right lower leg: No edema.  ?   Left lower leg: No edema.  ?   Comments: Left lower extremity negative for logroll, foot and knee compression testing, positive FABER test, negative FADIR test, tenderness over palpation of the lateral hip.  ?Neurological:  ?   Mental Status: She is alert.  ?  ? ?Assessment & Plan:  ? ?See Encounters Tab for problem based charting. ? ?Patient discussed with Dr. Evette Doffing ? ?

## 2021-05-31 ENCOUNTER — Telehealth: Payer: Self-pay

## 2021-05-31 NOTE — Progress Notes (Signed)
Internal Medicine Clinic Attending ? ?Case discussed with Dr. Winters  At the time of the visit.  We reviewed the resident?s history and exam and pertinent patient test results.  I agree with the assessment, diagnosis, and plan of care documented in the resident?s note.  ?

## 2021-05-31 NOTE — Telephone Encounter (Signed)
? ?  Telephone encounter was:  Unsuccessful.  05/31/2021 ?Name: Mandy Mullen MRN: [id number] DOB: 1950/09/17 ? ?Unsuccessful outbound call made today to assist with:   health insurance information. ? ?Outreach Attempt:  2nd Attempt ? ?Unable to leave message, voicemail does not pick-up. ? ?[name], AAS Paralegal, CHC ?Care Guide  Embedded Care Coordination ?[facility]  ?[address] ?[place], [place] [address] ?[email or web address]  ?? [phone]   ?[email or web address] ?  ?

## 2021-06-04 ENCOUNTER — Telehealth: Payer: Self-pay

## 2021-06-04 NOTE — Telephone Encounter (Signed)
Telephone encounter was:  Unsuccessful.  06/04/2021 Name: Mandy Mullen MRN: [id number] DOB: 05-30-1950  Unsuccessful outbound call made today to assist with:   health insurance information.  Outreach Attempt:  3rd Attempt.  Referral closed unable to contact patient.  Unable to leave message, voicemail does not pick-up.   Akira Adelsberger, AAS Paralegal, [facility] Care Guide  Embedded Care Coordination [facility]  [address] [place], [place] [address] ??[name]  ?? [phone]   [email or web address]

## 2021-06-10 ENCOUNTER — Ambulatory Visit (INDEPENDENT_AMBULATORY_CARE_PROVIDER_SITE_OTHER): Payer: Self-pay | Admitting: Internal Medicine

## 2021-06-10 ENCOUNTER — Ambulatory Visit: Payer: Self-pay | Admitting: Dietician

## 2021-06-10 ENCOUNTER — Other Ambulatory Visit (HOSPITAL_COMMUNITY): Payer: Self-pay

## 2021-06-10 ENCOUNTER — Encounter: Payer: Self-pay | Admitting: Dietician

## 2021-06-10 ENCOUNTER — Encounter: Payer: Self-pay | Admitting: Internal Medicine

## 2021-06-10 ENCOUNTER — Other Ambulatory Visit: Payer: Self-pay

## 2021-06-10 DIAGNOSIS — E1159 Type 2 diabetes mellitus with other circulatory complications: Secondary | ICD-10-CM

## 2021-06-10 DIAGNOSIS — E1122 Type 2 diabetes mellitus with diabetic chronic kidney disease: Secondary | ICD-10-CM

## 2021-06-10 DIAGNOSIS — M25552 Pain in left hip: Secondary | ICD-10-CM

## 2021-06-10 DIAGNOSIS — N183 Chronic kidney disease, stage 3 unspecified: Secondary | ICD-10-CM

## 2021-06-10 DIAGNOSIS — I152 Hypertension secondary to endocrine disorders: Secondary | ICD-10-CM

## 2021-06-10 MED ORDER — TRAMADOL HCL 50 MG PO TABS
50.0000 mg | ORAL_TABLET | Freq: Two times a day (BID) | ORAL | 0 refills | Status: DC | PRN
  Filled 2021-06-10: qty 10, 5d supply, fill #0

## 2021-06-10 NOTE — Progress Notes (Signed)
   Established Patient Office Visit  Subjective   Patient ID: Mandy Mullen, female    DOB: January 22, 1950  Age: 71 y.o. MRN: [id number]  Chief Complaint  Patient presents with   Left leg/foot pain    X 2 months; Pain level #10.   See Assessement and plan for HPI details   Objective:     BP 125/65 (BP Location: Left Arm, Patient Position: Sitting, Cuff Size: Normal)   Pulse 86   Temp 98.4 F (36.9 C) (Oral)   Ht '5\' 3"'$  (1.6 m)   Wt 164 lb 8 oz (74.6 kg)   SpO2 100% Comment: RA  BMI 29.14 kg/m  BP Readings from Last 3 Encounters:  06/10/21 125/65  05/26/21 (!) 150/78  05/05/21 (!) 147/67      Physical Exam   No results found for any visits on 06/10/21.  Last hemoglobin A1c Lab Results  Component Value Date   HGBA1C 10.3 (A) 05/05/2021      The 10-year ASCVD risk score (Arnett DK, et al., 2019) is: 28.7%    Assessment & Plan:   Problem List Items Addressed This Visit       Cardiovascular and Mediastinum   Hypertension associated with diabetes ([place]) (Chronic)    Review of dispense reports shows suboptimal adherence however recently picked up prescription and blood pressure is well controlled today.         Endocrine   CKD stage 3 due to type 2 diabetes mellitus (HCC) (Chronic)    She notes she is still taking Lantus 35 units and NovoLog 3 times daily with meals (fixed dose plus sliding scale) has noticed recent blood sugars in the 100s to low 200s range after starting Jardiance in April.  She is not yet due for a A1c recheck.  She did not bring her meter today.         Other   Left hip pain    She reports left hip pain that has been going on for about 2 months she was evaluated in our clinic and thought to possibly have OA of hip versus greater trochanteric pain syndrome.  She has intermittently been taking Aleve and ibuprofen as well as using Voltaren gel she notes that the oral medications partially help and the gel has not done anything for her  pain. She does not have significant tenderness over the greater trochanter today.  She does have some pain with internal and external rotation of the hip.  Straight leg raise is negative.  I cautioned her not to combined nonsteroidals to limit to 440 mg of naproxen twice daily maximum and ideally I would not have her do this given her CKD and uncontrolled diabetes.  I will try her on a 5-day course of tramadol 50 mg twice daily to see if this helps her pain she will need to call back for reassessment of pain to get further medications and compliance with [place] stop act.        Return in about 6 weeks (around 07/22/2021), or if symptoms worsen or fail to improve.    Lucious Groves, DO

## 2021-06-10 NOTE — Assessment & Plan Note (Addendum)
She notes she is still taking Lantus 35 units and NovoLog 3 times daily with meals (fixed dose plus sliding scale) has noticed recent blood sugars in the 100s to low 200s range after starting Jardiance in April.  She is not yet due for a A1c recheck.  She did not bring her meter today.

## 2021-06-10 NOTE — Progress Notes (Signed)
Patient is here for retinal camera images; unable to obtain images with retinal camera. Suggest she be referred back to Dr. Zenia Resides office when she obtains Medicare part B insurance.

## 2021-06-10 NOTE — Assessment & Plan Note (Signed)
Review of dispense reports shows suboptimal adherence however recently picked up prescription and blood pressure is well controlled today.

## 2021-06-10 NOTE — Assessment & Plan Note (Signed)
She reports left hip pain that has been going on for about 2 months she was evaluated in our clinic and thought to possibly have OA of hip versus greater trochanteric pain syndrome.  She has intermittently been taking Aleve and ibuprofen as well as using Voltaren gel she notes that the oral medications partially help and the gel has not done anything for her pain. She does not have significant tenderness over the greater trochanter today.  She does have some pain with internal and external rotation of the hip.  Straight leg raise is negative.  I cautioned her not to combined nonsteroidals to limit to 440 mg of naproxen twice daily maximum and ideally I would not have her do this given her CKD and uncontrolled diabetes.  I will try her on a 5-day course of tramadol 50 mg twice daily to see if this helps her pain she will need to call back for reassessment of pain to get further medications and compliance with [place] stop act.

## 2021-06-16 ENCOUNTER — Other Ambulatory Visit (HOSPITAL_COMMUNITY): Payer: Self-pay

## 2021-07-12 ENCOUNTER — Other Ambulatory Visit: Payer: Self-pay

## 2021-07-12 ENCOUNTER — Other Ambulatory Visit: Payer: Self-pay | Admitting: Internal Medicine

## 2021-07-12 ENCOUNTER — Other Ambulatory Visit (HOSPITAL_COMMUNITY): Payer: Self-pay

## 2021-07-13 ENCOUNTER — Other Ambulatory Visit: Payer: Self-pay | Admitting: Internal Medicine

## 2021-07-13 ENCOUNTER — Other Ambulatory Visit (HOSPITAL_COMMUNITY): Payer: Self-pay

## 2021-07-14 ENCOUNTER — Other Ambulatory Visit (HOSPITAL_COMMUNITY): Payer: Self-pay

## 2021-07-14 MED ORDER — LANTUS SOLOSTAR 100 UNIT/ML ~~LOC~~ SOPN
35.0000 [IU] | PEN_INJECTOR | Freq: Every day | SUBCUTANEOUS | 1 refills | Status: DC
  Filled 2021-07-14: qty 9, 25d supply, fill #0
  Filled 2021-08-26: qty 9, 25d supply, fill #1
  Filled 2021-10-05: qty 9, 25d supply, fill #2
  Filled 2021-11-15 (×2): qty 3, 8d supply, fill #3

## 2021-07-19 ENCOUNTER — Other Ambulatory Visit (HOSPITAL_COMMUNITY): Payer: Self-pay

## 2021-08-26 ENCOUNTER — Other Ambulatory Visit: Payer: Self-pay | Admitting: Internal Medicine

## 2021-08-26 ENCOUNTER — Other Ambulatory Visit (HOSPITAL_COMMUNITY): Payer: Self-pay

## 2021-08-26 DIAGNOSIS — E1122 Type 2 diabetes mellitus with diabetic chronic kidney disease: Secondary | ICD-10-CM

## 2021-08-26 MED ORDER — EMPAGLIFLOZIN 10 MG PO TABS
10.0000 mg | ORAL_TABLET | Freq: Every day | ORAL | 1 refills | Status: DC
  Filled 2021-08-26: qty 30, 30d supply, fill #0
  Filled 2021-10-06: qty 30, 30d supply, fill #1
  Filled 2021-11-15: qty 3, 3d supply, fill #2

## 2021-08-26 NOTE — Telephone Encounter (Signed)
Refill sent, but needs follow up visit with me.

## 2021-08-27 ENCOUNTER — Other Ambulatory Visit (HOSPITAL_COMMUNITY): Payer: Self-pay

## 2021-09-11 IMAGING — DX DG CHEST 1V PORT
1 series · 1 of 1 positions shown · non-contrast
Comparison: 03/24/2020.

CLINICAL DATA: Chest pain, hyperglycemia, altered mental status.

EXAM:
PORTABLE CHEST 1 VIEW

[chest ap]
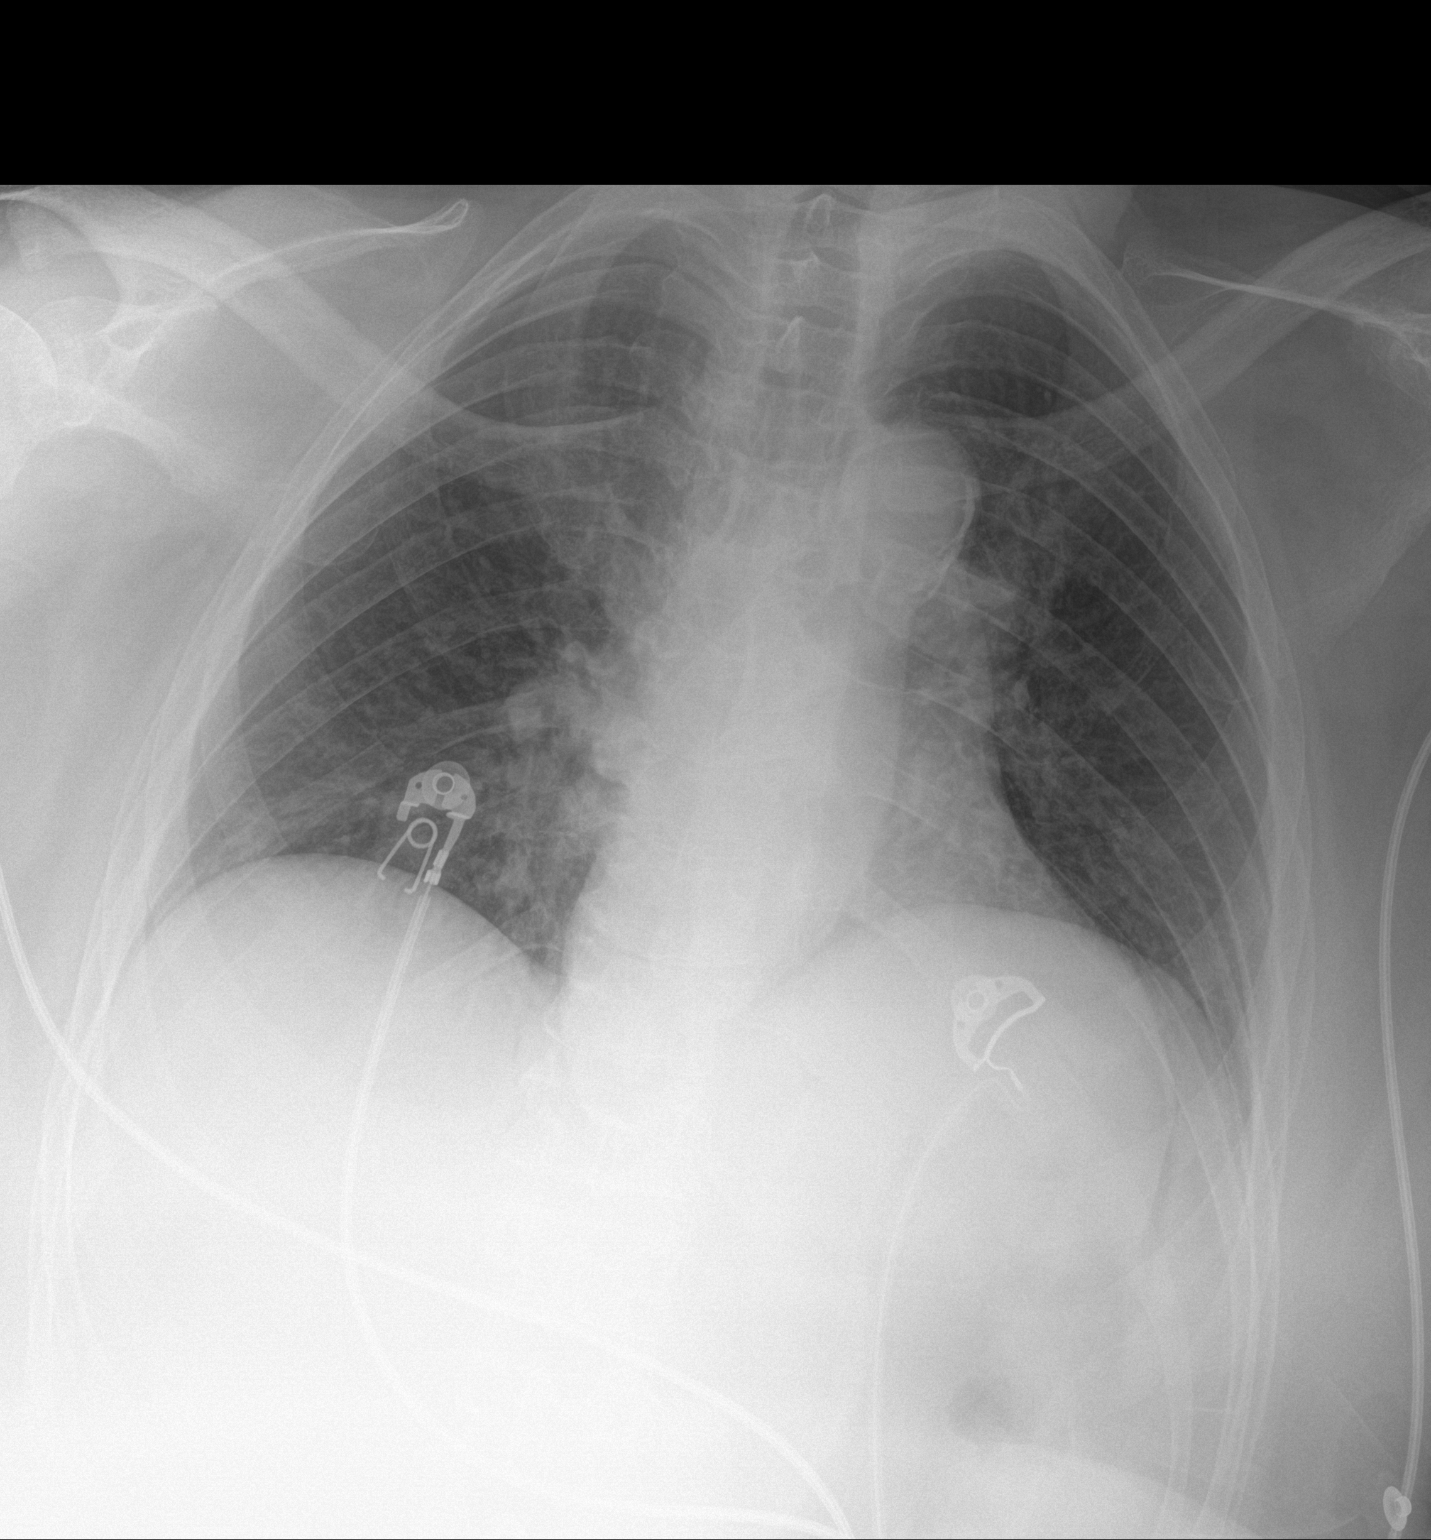

[1 of 1 positions shown; findings below may reference images not displayed]

FINDINGS: Trachea is midline. Heart is enlarged. Thoracic aorta is calcified.
Lungs are low in volume but clear. No pleural fluid.
IMPRESSION: Low lung volumes.  No acute findings.

## 2021-10-05 ENCOUNTER — Other Ambulatory Visit (HOSPITAL_COMMUNITY): Payer: Self-pay

## 2021-10-06 ENCOUNTER — Other Ambulatory Visit (HOSPITAL_COMMUNITY): Payer: Self-pay

## 2021-10-07 ENCOUNTER — Encounter: Payer: Self-pay | Admitting: Internal Medicine

## 2021-10-07 DIAGNOSIS — N183 Chronic kidney disease, stage 3 unspecified: Secondary | ICD-10-CM

## 2021-11-15 ENCOUNTER — Other Ambulatory Visit: Payer: Self-pay | Admitting: Internal Medicine

## 2021-11-15 ENCOUNTER — Other Ambulatory Visit (HOSPITAL_COMMUNITY): Payer: Self-pay

## 2021-11-15 ENCOUNTER — Other Ambulatory Visit: Payer: Self-pay

## 2021-11-15 DIAGNOSIS — N183 Chronic kidney disease, stage 3 unspecified: Secondary | ICD-10-CM

## 2021-11-15 NOTE — Telephone Encounter (Signed)
Patient called in stating she is completely out of these meds. She has scheduled appt to see PCP for 01/20/22.

## 2021-11-16 ENCOUNTER — Other Ambulatory Visit (HOSPITAL_COMMUNITY): Payer: Self-pay

## 2021-11-16 MED ORDER — EMPAGLIFLOZIN 10 MG PO TABS
10.0000 mg | ORAL_TABLET | Freq: Every day | ORAL | 2 refills | Status: DC
  Filled 2021-11-16: qty 30, 30d supply, fill #0
  Filled 2021-12-17: qty 30, 30d supply, fill #1

## 2021-11-16 MED ORDER — LANTUS SOLOSTAR 100 UNIT/ML ~~LOC~~ SOPN
35.0000 [IU] | PEN_INJECTOR | Freq: Every day | SUBCUTANEOUS | 2 refills | Status: DC
  Filled 2021-11-16: qty 9, 25d supply, fill #0
  Filled 2021-12-17: qty 9, 25d supply, fill #1

## 2021-12-17 ENCOUNTER — Other Ambulatory Visit: Payer: Self-pay

## 2021-12-17 ENCOUNTER — Other Ambulatory Visit (HOSPITAL_COMMUNITY): Payer: Self-pay

## 2021-12-30 ENCOUNTER — Ambulatory Visit: Payer: Self-pay | Admitting: Licensed Clinical Social Worker

## 2021-12-30 NOTE — Patient Outreach (Signed)
SW removed self from care team.   Cyntia Staley, BSW, MSW, LCSW-A  Social Worker [facility]  [phone] 

## 2022-01-03 ENCOUNTER — Other Ambulatory Visit (HOSPITAL_COMMUNITY): Payer: Self-pay

## 2022-01-03 ENCOUNTER — Other Ambulatory Visit: Payer: Self-pay | Admitting: Internal Medicine

## 2022-01-03 DIAGNOSIS — E785 Hyperlipidemia, unspecified: Secondary | ICD-10-CM

## 2022-01-03 MED ORDER — ROSUVASTATIN CALCIUM 20 MG PO TABS
20.0000 mg | ORAL_TABLET | Freq: Every day | ORAL | 3 refills | Status: DC
  Filled 2022-01-03 – 2022-01-13 (×2): qty 30, 30d supply, fill #0

## 2022-01-12 ENCOUNTER — Other Ambulatory Visit (HOSPITAL_COMMUNITY): Payer: Self-pay

## 2022-01-13 ENCOUNTER — Other Ambulatory Visit (HOSPITAL_COMMUNITY): Payer: Self-pay

## 2022-01-20 ENCOUNTER — Encounter: Payer: Self-pay | Admitting: Internal Medicine

## 2022-01-24 ENCOUNTER — Telehealth: Payer: Self-pay | Admitting: Internal Medicine

## 2022-01-24 ENCOUNTER — Other Ambulatory Visit: Payer: Self-pay | Admitting: *Deleted

## 2022-01-24 ENCOUNTER — Other Ambulatory Visit (HOSPITAL_COMMUNITY): Payer: Self-pay

## 2022-01-24 DIAGNOSIS — E785 Hyperlipidemia, unspecified: Secondary | ICD-10-CM

## 2022-01-24 DIAGNOSIS — E11649 Type 2 diabetes mellitus with hypoglycemia without coma: Secondary | ICD-10-CM

## 2022-01-24 DIAGNOSIS — E1122 Type 2 diabetes mellitus with diabetic chronic kidney disease: Secondary | ICD-10-CM

## 2022-01-24 MED ORDER — AMLODIPINE BESYLATE 10 MG PO TABS
10.0000 mg | ORAL_TABLET | Freq: Every day | ORAL | 0 refills | Status: DC
  Filled 2022-01-24: qty 30, 30d supply, fill #0
  Filled 2022-03-11: qty 30, 30d supply, fill #1
  Filled 2022-04-26: qty 30, 30d supply, fill #2

## 2022-01-24 MED ORDER — INSULIN ASPART 100 UNIT/ML FLEXPEN
4.0000 [IU] | PEN_INJECTOR | Freq: Three times a day (TID) | SUBCUTANEOUS | 0 refills | Status: DC
  Filled 2022-01-24: qty 9, 25d supply, fill #0

## 2022-01-24 MED ORDER — EMPAGLIFLOZIN 10 MG PO TABS
10.0000 mg | ORAL_TABLET | Freq: Every day | ORAL | 0 refills | Status: DC
  Filled 2022-01-24: qty 30, 30d supply, fill #0

## 2022-01-24 MED ORDER — FREESTYLE LANCETS MISC
Freq: Three times a day (TID) | 0 refills | Status: AC
  Filled 2022-01-24: qty 100, 34d supply, fill #0

## 2022-01-24 MED ORDER — LANTUS SOLOSTAR 100 UNIT/ML ~~LOC~~ SOPN
35.0000 [IU] | PEN_INJECTOR | Freq: Every day | SUBCUTANEOUS | 0 refills | Status: DC
  Filled 2022-01-24: qty 9, 25d supply, fill #0
  Filled 2022-02-18: qty 6, 17d supply, fill #1

## 2022-01-24 MED ORDER — ROSUVASTATIN CALCIUM 20 MG PO TABS
20.0000 mg | ORAL_TABLET | Freq: Every day | ORAL | 0 refills | Status: DC
  Filled 2022-01-24: qty 90, 90d supply, fill #0
  Filled 2022-03-11: qty 30, 30d supply, fill #0
  Filled 2022-04-26: qty 30, 30d supply, fill #1
  Filled 2022-05-03: qty 30, 30d supply, fill #2

## 2022-01-24 NOTE — Telephone Encounter (Signed)
Pt calling to report she was unable to keep her appt last week with Dr. Heber [place]. Pt reports her son died last week and sheforgot about her appt.  The pt has sch another appt for 02/17/2022 and is needing the following meds refilled.   rosuvastatin (CRESTOR) 20 MG tablet   amLODipine (NORVASC) 10 MG tablet (Expired)   insulin aspart (NOVOLOG) 100 UNIT/ML FlexPen  insulin glargine (LANTUS SOLOSTAR) 100 UNIT/ML Solostar Pen Insulin Pen Needle 32G X 4 MM MISC  empagliflozin (JARDIANCE) 10 MG TABS tablet   Lancets (FREESTYLE) lancets   [place] (Ph: [phone])

## 2022-01-24 NOTE — Telephone Encounter (Signed)
Sent in rx without refills. Would like her to keep next appointment

## 2022-02-17 ENCOUNTER — Ambulatory Visit (INDEPENDENT_AMBULATORY_CARE_PROVIDER_SITE_OTHER): Payer: Self-pay | Admitting: Internal Medicine

## 2022-02-17 ENCOUNTER — Encounter: Payer: Self-pay | Admitting: Internal Medicine

## 2022-02-17 ENCOUNTER — Other Ambulatory Visit: Payer: Self-pay

## 2022-02-17 ENCOUNTER — Other Ambulatory Visit (HOSPITAL_COMMUNITY): Payer: Self-pay

## 2022-02-17 VITALS — BP 115/62 | HR 85 | Temp 97.9°F | Ht 63.0 in | Wt 157.4 lb

## 2022-02-17 DIAGNOSIS — I152 Hypertension secondary to endocrine disorders: Secondary | ICD-10-CM

## 2022-02-17 DIAGNOSIS — Z794 Long term (current) use of insulin: Secondary | ICD-10-CM

## 2022-02-17 DIAGNOSIS — N183 Chronic kidney disease, stage 3 unspecified: Secondary | ICD-10-CM

## 2022-02-17 DIAGNOSIS — E785 Hyperlipidemia, unspecified: Secondary | ICD-10-CM

## 2022-02-17 DIAGNOSIS — E1159 Type 2 diabetes mellitus with other circulatory complications: Secondary | ICD-10-CM

## 2022-02-17 DIAGNOSIS — E1122 Type 2 diabetes mellitus with diabetic chronic kidney disease: Secondary | ICD-10-CM

## 2022-02-17 LAB — GLUCOSE, CAPILLARY: Glucose-Capillary: 166 mg/dL — ABNORMAL HIGH (ref 70–99)

## 2022-02-17 LAB — POCT GLYCOSYLATED HEMOGLOBIN (HGB A1C): Hemoglobin A1C: 11.3 % — AB (ref 4.0–5.6)

## 2022-02-17 MED ORDER — INSULIN ASPART 100 UNIT/ML FLEXPEN
6.0000 [IU] | PEN_INJECTOR | Freq: Three times a day (TID) | SUBCUTANEOUS | 2 refills | Status: DC
  Filled 2022-02-17: qty 12, 26d supply, fill #0

## 2022-02-17 NOTE — Assessment & Plan Note (Signed)
Deferred lipid panel to next visit due to lack of insurance/S DOH

## 2022-02-17 NOTE — Progress Notes (Signed)
Established Patient Office Visit  Subjective   Patient ID: Mandy Mullen, female    DOB: 1950-09-06  Age: 72 y.o. MRN: [id number]  Chief Complaint  Patient presents with   Check-up Visit   Mandy Mullen presents today for follow-up of her diabetes and hypertension.  Historically she is had very poor control of her chronic health conditions and a lot of this is due to lack of medical insurance.  On the right side she did tell me that she finally applied for Medicare part B in January and is hoping that it will take effect soon.  She is currently considering Medicare advantage plans. For her diabetes she notes that she has been taking Lantus 30 units daily NovoLog ranges between 4 and 12 units per my last sliding scale instructions.  She notes that sugars typically trend higher throughout the day but she did not bring her meter or any blood glucose log.  She reports adherence to Jardiance. She reports adherence to her antihypertensives and denies any side effects.  Pharmacy refill data is incomplete.      Objective:     BP 115/62 (BP Location: Right Arm, Patient Position: Sitting, Cuff Size: Normal)   Pulse 85   Temp 97.9 F (36.6 C) (Oral)   Ht '5\' 3"'$  (1.6 m)   Wt 157 lb 6.4 oz (71.4 kg)   SpO2 100% Comment: RA  BMI 27.88 kg/m  BP Readings from Last 3 Encounters:  02/17/22 115/62  06/10/21 125/65  05/26/21 (!) 150/78   Wt Readings from Last 3 Encounters:  02/17/22 157 lb 6.4 oz (71.4 kg)  06/10/21 164 lb 8 oz (74.6 kg)  05/26/21 158 lb 6.4 oz (71.8 kg)      Physical Exam   Results for orders placed or performed in visit on 02/17/22  Glucose, capillary  Result Value Ref Range   Glucose-Capillary 166 (H) 70 - 99 mg/dL  POC Hbg A1C  Result Value Ref Range   Hemoglobin A1C 11.3 (A) 4.0 - 5.6 %   HbA1c POC (<> result, manual entry)     HbA1c, POC (prediabetic range)     HbA1c, POC (controlled diabetic range)      Last CBC Lab Results  Component Value Date   WBC 12.7  (H) 11/15/2020   HGB 10.6 (L) 11/15/2020   HCT 30.6 (L) 11/15/2020   MCV 87.4 11/15/2020   MCH 30.3 11/15/2020   RDW 13.7 11/15/2020   PLT 224 37/62/8315   Last metabolic panel Lab Results  Component Value Date   GLUCOSE 151 (H) 11/30/2020   NA 140 11/30/2020   K 4.5 11/30/2020   CL 102 11/30/2020   CO2 24 11/30/2020   BUN 20 11/30/2020   CREATININE 0.87 11/30/2020   EGFR 72 11/30/2020   CALCIUM 9.2 11/30/2020   PHOS 3.5 11/14/2020   PROT 6.6 11/13/2020   ALBUMIN 3.6 11/13/2020   BILITOT 1.4 (H) 11/13/2020   ALKPHOS 116 11/13/2020   AST 53 (H) 11/13/2020   ALT 52 (H) 11/13/2020   ANIONGAP 4 (L) 11/16/2020   Last hemoglobin A1c Lab Results  Component Value Date   HGBA1C 11.3 (A) 02/17/2022      The 10-year ASCVD risk score (Arnett DK, et al., 2019) is: 26.3%    Assessment & Plan:   Problem List Items Addressed This Visit       Cardiovascular and Mediastinum   Hypertension associated with diabetes ([place]) (Chronic)    Blood pressure is well-controlled may be just  on amlodipine I asked her to bring medications to next visit.      Relevant Medications   insulin aspart (NOVOLOG) 100 UNIT/ML FlexPen     Endocrine   CKD stage 3 due to type 2 diabetes mellitus ([place]) - Primary (Chronic)    Discussed would like to get labs including a BMP and lipid panel however with her insurance coverage and question it is reasonable to defer this to next visit.  I will go ahead and increase her NovoLog mealtime 6 units and have adjusted the sliding scale for her.  Asked her to bring her meter to next visit.  A1c remains uncontrolled she remains hyperglycemic      Relevant Medications   insulin aspart (NOVOLOG) 100 UNIT/ML FlexPen   Other Relevant Orders   POC Hbg A1C (Completed)     Other   Hyperlipidemia (Chronic)    Deferred lipid panel to next visit due to lack of insurance/S DOH       Return in about 3 months (around 05/18/2022).    Lucious Groves, DO

## 2022-02-17 NOTE — Assessment & Plan Note (Signed)
Blood pressure is well-controlled may be just on amlodipine I asked her to bring medications to next visit.

## 2022-02-17 NOTE — Patient Instructions (Signed)
I want you to use 6 units with each meal unless the blood sugar is less than 70.  - 160- 200: + 1 unit =7 units - 201- 250: + 2 units= 8 units - 251-300: + 3 units=  9 units - 301- 350: + 4 units=  10 units - 301- 350: + 5 units= 11 units - 351-400: + 7 units= 13 units -     > 400: + 9 units= 15 units

## 2022-02-17 NOTE — Assessment & Plan Note (Addendum)
Discussed would like to get labs including a BMP and lipid panel however with her insurance coverage and question it is reasonable to defer this to next visit.  I will go ahead and increase her NovoLog mealtime 6 units and have adjusted the sliding scale for her.  Asked her to bring her meter to next visit.  A1c remains uncontrolled she remains hyperglycemic

## 2022-02-18 ENCOUNTER — Other Ambulatory Visit (HOSPITAL_COMMUNITY): Payer: Self-pay

## 2022-03-11 ENCOUNTER — Other Ambulatory Visit (HOSPITAL_COMMUNITY): Payer: Self-pay

## 2022-03-11 ENCOUNTER — Other Ambulatory Visit: Payer: Self-pay

## 2022-03-25 ENCOUNTER — Other Ambulatory Visit: Payer: Self-pay

## 2022-03-30 ENCOUNTER — Other Ambulatory Visit (HOSPITAL_COMMUNITY): Payer: Self-pay

## 2022-03-30 ENCOUNTER — Other Ambulatory Visit: Payer: Self-pay | Admitting: Internal Medicine

## 2022-03-31 ENCOUNTER — Other Ambulatory Visit: Payer: Self-pay | Admitting: Internal Medicine

## 2022-03-31 ENCOUNTER — Other Ambulatory Visit: Payer: Self-pay

## 2022-03-31 MED ORDER — LANTUS SOLOSTAR 100 UNIT/ML ~~LOC~~ SOPN
35.0000 [IU] | PEN_INJECTOR | Freq: Every day | SUBCUTANEOUS | 0 refills | Status: DC
  Filled 2022-03-31: qty 15, 42d supply, fill #0
  Filled 2022-04-01: qty 9, 25d supply, fill #0
  Filled 2022-05-12: qty 6, 17d supply, fill #1

## 2022-03-31 NOTE — Telephone Encounter (Signed)
Next appt scheduled 05/19/22 with PCP.

## 2022-03-31 NOTE — Telephone Encounter (Signed)
Rx was refilled today - called pt , no answer. NO vm, unable to leave a message.

## 2022-04-01 ENCOUNTER — Other Ambulatory Visit: Payer: Self-pay

## 2022-04-01 ENCOUNTER — Other Ambulatory Visit (HOSPITAL_COMMUNITY): Payer: Self-pay

## 2022-04-26 ENCOUNTER — Other Ambulatory Visit: Payer: Self-pay

## 2022-05-03 ENCOUNTER — Other Ambulatory Visit: Payer: Self-pay

## 2022-05-12 ENCOUNTER — Other Ambulatory Visit (HOSPITAL_COMMUNITY): Payer: Self-pay

## 2022-05-12 ENCOUNTER — Other Ambulatory Visit: Payer: Self-pay | Admitting: Internal Medicine

## 2022-05-12 NOTE — Telephone Encounter (Signed)
Next appt scheduled  5/2/with PCP. 

## 2022-05-13 ENCOUNTER — Other Ambulatory Visit (HOSPITAL_COMMUNITY): Payer: Self-pay

## 2022-05-13 MED ORDER — LANTUS SOLOSTAR 100 UNIT/ML ~~LOC~~ SOPN
35.0000 [IU] | PEN_INJECTOR | Freq: Every day | SUBCUTANEOUS | 0 refills | Status: DC
  Filled 2022-05-13: qty 9, 25d supply, fill #0

## 2022-05-19 ENCOUNTER — Other Ambulatory Visit (HOSPITAL_COMMUNITY): Payer: Self-pay

## 2022-05-19 ENCOUNTER — Ambulatory Visit: Payer: Self-pay

## 2022-05-19 ENCOUNTER — Encounter: Payer: Self-pay | Admitting: Internal Medicine

## 2022-05-19 ENCOUNTER — Ambulatory Visit (INDEPENDENT_AMBULATORY_CARE_PROVIDER_SITE_OTHER): Payer: Self-pay | Admitting: Internal Medicine

## 2022-05-19 VITALS — BP 152/75 | HR 91 | Temp 98.1°F | Ht 63.0 in | Wt 164.8 lb

## 2022-05-19 DIAGNOSIS — Z794 Long term (current) use of insulin: Secondary | ICD-10-CM

## 2022-05-19 DIAGNOSIS — I152 Hypertension secondary to endocrine disorders: Secondary | ICD-10-CM

## 2022-05-19 DIAGNOSIS — E785 Hyperlipidemia, unspecified: Secondary | ICD-10-CM

## 2022-05-19 DIAGNOSIS — N183 Chronic kidney disease, stage 3 unspecified: Secondary | ICD-10-CM

## 2022-05-19 DIAGNOSIS — E1159 Type 2 diabetes mellitus with other circulatory complications: Secondary | ICD-10-CM

## 2022-05-19 DIAGNOSIS — E1122 Type 2 diabetes mellitus with diabetic chronic kidney disease: Secondary | ICD-10-CM

## 2022-05-19 LAB — POCT GLYCOSYLATED HEMOGLOBIN (HGB A1C): Hemoglobin A1C: 12.5 % — AB (ref 4.0–5.6)

## 2022-05-19 LAB — GLUCOSE, CAPILLARY: Glucose-Capillary: 278 mg/dL — ABNORMAL HIGH (ref 70–99)

## 2022-05-19 MED ORDER — ROSUVASTATIN CALCIUM 20 MG PO TABS
20.0000 mg | ORAL_TABLET | Freq: Every day | ORAL | 3 refills | Status: AC
  Filled 2022-05-19: qty 30, 30d supply, fill #0
  Filled 2022-07-20: qty 30, 30d supply, fill #1
  Filled 2022-09-30: qty 30, 30d supply, fill #2
  Filled 2022-11-25: qty 30, 30d supply, fill #3
  Filled 2023-01-23: qty 30, 30d supply, fill #4
  Filled 2023-02-28: qty 30, 30d supply, fill #0

## 2022-05-19 MED ORDER — LANTUS SOLOSTAR 100 UNIT/ML ~~LOC~~ SOPN
35.0000 [IU] | PEN_INJECTOR | Freq: Every day | SUBCUTANEOUS | 5 refills | Status: DC
  Filled 2022-05-19: qty 15, 42d supply, fill #0
  Filled 2022-06-10: qty 9, 25d supply, fill #0
  Filled 2022-07-20: qty 15, 42d supply, fill #1
  Filled 2022-09-06: qty 15, 42d supply, fill #2
  Filled 2022-10-31: qty 9, 25d supply, fill #3
  Filled 2022-10-31: qty 15, 42d supply, fill #3
  Filled 2022-12-09: qty 12, 34d supply, fill #0

## 2022-05-19 MED ORDER — INSULIN ASPART 100 UNIT/ML FLEXPEN
6.0000 [IU] | PEN_INJECTOR | Freq: Three times a day (TID) | SUBCUTANEOUS | 5 refills | Status: DC
  Filled 2022-05-19: qty 12, 26d supply, fill #0
  Filled 2022-10-31: qty 12, 26d supply, fill #1

## 2022-05-19 MED ORDER — INSULIN PEN NEEDLE 32G X 4 MM MISC
1.0000 | Freq: Four times a day (QID) | 3 refills | Status: AC
  Filled 2022-05-19: qty 100, 25d supply, fill #0

## 2022-05-19 MED ORDER — LIRAGLUTIDE 18 MG/3ML ~~LOC~~ SOPN
PEN_INJECTOR | SUBCUTANEOUS | 2 refills | Status: AC
  Filled 2022-05-19: qty 6, 45d supply, fill #0
  Filled 2022-10-31 – 2022-11-25 (×2): qty 6, 30d supply, fill #1

## 2022-05-19 MED ORDER — AMLODIPINE BESYLATE 10 MG PO TABS
10.0000 mg | ORAL_TABLET | Freq: Every day | ORAL | 3 refills | Status: AC
  Filled 2022-05-19: qty 30, 30d supply, fill #0
  Filled 2022-07-20: qty 30, 30d supply, fill #1
  Filled 2022-09-30: qty 30, 30d supply, fill #2
  Filled 2022-11-25: qty 30, 30d supply, fill #3
  Filled 2023-01-23: qty 30, 30d supply, fill #4
  Filled 2023-02-28: qty 30, 30d supply, fill #0

## 2022-05-19 MED ORDER — SPIRONOLACTONE 25 MG PO TABS
12.5000 mg | ORAL_TABLET | Freq: Every day | ORAL | 3 refills | Status: AC
  Filled 2022-05-19: qty 15, 30d supply, fill #0
  Filled 2022-07-20: qty 15, 30d supply, fill #1
  Filled 2022-09-30: qty 15, 30d supply, fill #2
  Filled 2023-01-23: qty 15, 30d supply, fill #3

## 2022-05-19 NOTE — Assessment & Plan Note (Signed)
She was previously on multiple antihypertensives but were discontinued but to hypotension she is currently taking amlodipine.  Will plan to add low-dose spironolactone.

## 2022-05-19 NOTE — Assessment & Plan Note (Signed)
Refilled her Crestor 20 mg we will hold off checking labs today due to cost and lack of taking the medication recently we want to see the full efficacy.

## 2022-05-19 NOTE — Progress Notes (Signed)
Established Patient Office Visit  Subjective   Patient ID: Mandy Mullen, female    DOB: 02-15-1950  Age: 72 y.o. MRN: [id number]  Chief Complaint  Patient presents with   Diabetes   Hypertension   Mandy Mullen returns today for follow-up of her diabetes and hypertension.  Her diabetes has been historically uncontrolled partially due to social determinants of health and lack of healthcare insurance we have tried to make things affordable with our pharmacy this has been successful however she is still struggling with controlling her diabetes.  She notes that she stopped taking the Jardiance due to some pain in tingling in her left leg this pain and tingling improved after she stopped it and so she wants to stay off Jardiance.  She does have a history of diabetic neuropathy.  She does urinate frequently she tries to stay hydrated with water.  She has been using sliding scale NovoLog and long-acting Lantus.  She does not have much insight into how much NovoLog she should give herself.  Her diet typically consist of oatmeal in the morning.  Eating leftovers at lunch and making dinner she typically has a few ounces of meat plus noodles rice or pasta or potatoes she does not like [place] fries. She has been adherent to her amlodipine and thinks that she is taking her cholesterol medication but ran out recently     Objective:     BP (!) 152/75 (BP Location: Left Arm, Patient Position: Sitting, Cuff Size: Normal)   Pulse 91   Temp 98.1 F (36.7 C) (Oral)   Ht 5\' 3"  (1.6 m)   Wt 164 lb 12.8 oz (74.8 kg)   SpO2 100%   BMI 29.19 kg/m  BP Readings from Last 3 Encounters:  05/19/22 (!) 152/75  02/17/22 115/62  06/10/21 125/65   Wt Readings from Last 3 Encounters:  05/19/22 164 lb 12.8 oz (74.8 kg)  02/17/22 157 lb 6.4 oz (71.4 kg)  06/10/21 164 lb 8 oz (74.6 kg)      Physical Exam Vitals and nursing note reviewed.  Constitutional:      Appearance: Normal appearance.  Pulmonary:      Effort: Pulmonary effort is normal.  Musculoskeletal:     Right lower leg: No edema.     Left lower leg: No edema.  Neurological:     Mental Status: She is alert.  Psychiatric:        Mood and Affect: Mood normal.        Behavior: Behavior normal.      Results for orders placed or performed in visit on 05/19/22  Glucose, capillary  Result Value Ref Range   Glucose-Capillary 278 (H) 70 - 99 mg/dL  POC Hbg W0J  Result Value Ref Range   Hemoglobin A1C 12.5 (A) 4.0 - 5.6 %   HbA1c POC (<> result, manual entry)     HbA1c, POC (prediabetic range)     HbA1c, POC (controlled diabetic range)      Last metabolic panel Lab Results  Component Value Date   GLUCOSE 151 (H) 11/30/2020   NA 140 11/30/2020   K 4.5 11/30/2020   CL 102 11/30/2020   CO2 24 11/30/2020   BUN 20 11/30/2020   CREATININE 0.87 11/30/2020   EGFR 72 11/30/2020   CALCIUM 9.2 11/30/2020   PHOS 3.5 11/14/2020   PROT 6.6 11/13/2020   ALBUMIN 3.6 11/13/2020   BILITOT 1.4 (H) 11/13/2020   ALKPHOS 116 11/13/2020   AST 53 (H) 11/13/2020  ALT 52 (H) 11/13/2020   ANIONGAP 4 (L) 11/16/2020   Last lipids Lab Results  Component Value Date   CHOL 248 (H) 03/12/2020   HDL 59 03/12/2020   LDLCALC 164 (H) 03/12/2020   TRIG 140 03/12/2020   CHOLHDL 4.2 03/12/2020   Last hemoglobin A1c Lab Results  Component Value Date   HGBA1C 12.5 (A) 05/19/2022      The 10-year ASCVD risk score (Arnett DK, et al., 2019) is: 41%    Assessment & Plan:   Problem List Items Addressed This Visit       Cardiovascular and Mediastinum   Hypertension associated with diabetes ([facility]CC) (Chronic)    She was previously on multiple antihypertensives but were discontinued but to hypotension she is currently taking amlodipine.  Will plan to add low-dose spironolactone.      Relevant Medications   spironolactone (ALDACTONE) 25 MG tablet   amLODipine (NORVASC) 10 MG tablet   insulin aspart (NOVOLOG) 100 UNIT/ML FlexPen   insulin  glargine (LANTUS SOLOSTAR) 100 UNIT/ML Solostar Pen   rosuvastatin (CRESTOR) 20 MG tablet   liraglutide (VICTOZA) 18 MG/3ML SOPN     Endocrine   CKD stage 3 due to type 2 diabetes mellitus ([facility]CC) - Primary (Chronic)    Unable to tolerate the SGLT2 inhibitor although I suspect the side effect was due to her uncontrolled diabetes causing diabetic neuropathy rather than the medication itself I discussed we will keep her off this but we should readdress this in the future we will try to get better glycemic control with her insulin regiment right now.  I discussed with her limiting carbohydrates and high starch foods and when she does not want to her to be injecting closer to the 10 to 15 units I also want her to try to check her sugars more frequently.  She is interested in a CGM however lacks insurance coverage to help with the cost of this hopefully we can look forward to this if she is able to get Medicare part B/D She is also willing to start Victoza (started 0.6 mg daily and increase to 1.2 next month I will see her back in 3 months and decide if she should go up to the 1.8 dose.      Relevant Medications   insulin aspart (NOVOLOG) 100 UNIT/ML FlexPen   insulin glargine (LANTUS SOLOSTAR) 100 UNIT/ML Solostar Pen   rosuvastatin (CRESTOR) 20 MG tablet   liraglutide (VICTOZA) 18 MG/3ML SOPN   Other Relevant Orders   POC Hbg A1C (Completed)   BMP8+Anion Gap     Other   Hyperlipidemia (Chronic)    Refilled her Crestor 20 mg we will hold off checking labs today due to cost and lack of taking the medication recently we want to see the full efficacy.      Relevant Medications   spironolactone (ALDACTONE) 25 MG tablet   amLODipine (NORVASC) 10 MG tablet   rosuvastatin (CRESTOR) 20 MG tablet   Other Visit Diagnoses     Dyslipidemia       Relevant Medications   rosuvastatin (CRESTOR) 20 MG tablet       Return in about 3 months (around 08/19/2022).    Gust Rung, DO

## 2022-05-19 NOTE — Patient Instructions (Addendum)
I am starting you on a low dose second blood pressure medication called spironolactone you will take half a pill each day.  I am also adding Victoza for your diabetes, start with 0.6mg  daily for 1 month then increase to 1.2mg  daily  I want you to cut back on the starchy foods that are high in carbohydrates, if you plan to have those in your meals you need a larger dose of your sliding scale insulin.  Try to check your sugar if you feel it is low versus just treating it.

## 2022-05-19 NOTE — Assessment & Plan Note (Addendum)
Unable to tolerate the SGLT2 inhibitor although I suspect the side effect was due to her uncontrolled diabetes causing diabetic neuropathy rather than the medication itself I discussed we will keep her off this but we should readdress this in the future we will try to get better glycemic control with her insulin regiment right now.  I discussed with her limiting carbohydrates and high starch foods and when she does not want to her to be injecting closer to the 10 to 15 units I also want her to try to check her sugars more frequently.  She is interested in a CGM however lacks insurance coverage to help with the cost of this hopefully we can look forward to this if she is able to get Medicare part B/D She is also willing to start Victoza (started 0.6 mg daily and increase to 1.2 next month I will see her back in 3 months and decide if she should go up to the 1.8 dose.

## 2022-05-20 LAB — BMP8+ANION GAP
Anion Gap: 14 mmol/L (ref 10.0–18.0)
BUN/Creatinine Ratio: 25 (ref 12–28)
BUN: 31 mg/dL — ABNORMAL HIGH (ref 8–27)
CO2: 21 mmol/L (ref 20–29)
Calcium: 8.9 mg/dL (ref 8.7–10.3)
Chloride: 102 mmol/L (ref 96–106)
Creatinine, Ser: 1.25 mg/dL — ABNORMAL HIGH (ref 0.57–1.00)
Glucose: 302 mg/dL — ABNORMAL HIGH (ref 70–99)
Potassium: 4.8 mmol/L (ref 3.5–5.2)
Sodium: 137 mmol/L (ref 134–144)
eGFR: 46 mL/min/{1.73_m2} — ABNORMAL LOW (ref >59)

## 2022-05-23 ENCOUNTER — Other Ambulatory Visit (HOSPITAL_COMMUNITY): Payer: Self-pay

## 2022-06-10 ENCOUNTER — Other Ambulatory Visit (HOSPITAL_COMMUNITY): Payer: Self-pay

## 2022-06-10 ENCOUNTER — Other Ambulatory Visit: Payer: Self-pay

## 2022-07-20 ENCOUNTER — Other Ambulatory Visit (HOSPITAL_COMMUNITY): Payer: Self-pay

## 2022-09-06 ENCOUNTER — Other Ambulatory Visit (HOSPITAL_COMMUNITY): Payer: Self-pay

## 2022-09-15 ENCOUNTER — Other Ambulatory Visit (HOSPITAL_COMMUNITY): Payer: Self-pay

## 2022-09-30 ENCOUNTER — Other Ambulatory Visit (HOSPITAL_COMMUNITY): Payer: Self-pay

## 2022-10-21 ENCOUNTER — Other Ambulatory Visit: Payer: Self-pay | Admitting: Internal Medicine

## 2022-10-21 DIAGNOSIS — Z1212 Encounter for screening for malignant neoplasm of rectum: Secondary | ICD-10-CM

## 2022-10-21 DIAGNOSIS — Z1211 Encounter for screening for malignant neoplasm of colon: Secondary | ICD-10-CM

## 2022-10-31 ENCOUNTER — Other Ambulatory Visit (HOSPITAL_COMMUNITY): Payer: Self-pay

## 2022-11-25 ENCOUNTER — Other Ambulatory Visit (HOSPITAL_COMMUNITY): Payer: Self-pay

## 2022-12-09 ENCOUNTER — Other Ambulatory Visit (HOSPITAL_COMMUNITY): Payer: Self-pay

## 2022-12-09 ENCOUNTER — Other Ambulatory Visit: Payer: Self-pay

## 2022-12-12 ENCOUNTER — Telehealth: Payer: Self-pay

## 2022-12-12 ENCOUNTER — Other Ambulatory Visit (HOSPITAL_COMMUNITY): Payer: Self-pay

## 2022-12-12 ENCOUNTER — Other Ambulatory Visit: Payer: Self-pay

## 2022-12-12 MED ORDER — BASAGLAR KWIKPEN 100 UNIT/ML ~~LOC~~ SOPN
35.0000 [IU] | PEN_INJECTOR | Freq: Every day | SUBCUTANEOUS | 5 refills | Status: AC
  Filled 2022-12-12 – 2023-01-23 (×2): qty 9, 25d supply, fill #0
  Filled 2023-02-28: qty 9, 25d supply, fill #1

## 2022-12-12 NOTE — Telephone Encounter (Signed)
Patient previously received LANTUS at [facility] on the IM Program. Medication is no longer on program and patient is still uninsured.   If applicable, could BASAGLAR be sent to [facility] pharmacy? Medication is on their [facility] program (free med for uninsured patients).

## 2022-12-12 NOTE — Telephone Encounter (Signed)
Updated to basglar and sent to [facility]

## 2022-12-13 ENCOUNTER — Telehealth: Payer: Self-pay

## 2022-12-13 NOTE — Telephone Encounter (Signed)
Novolog no longer on IM Program.  Mailed novo nordisk patient assistance application to pt's home.

## 2022-12-21 ENCOUNTER — Other Ambulatory Visit: Payer: Self-pay

## 2023-01-23 ENCOUNTER — Other Ambulatory Visit: Payer: Self-pay

## 2023-01-23 ENCOUNTER — Other Ambulatory Visit: Payer: Self-pay | Admitting: Internal Medicine

## 2023-01-23 ENCOUNTER — Other Ambulatory Visit (HOSPITAL_COMMUNITY): Payer: Self-pay

## 2023-01-23 MED ORDER — INSULIN LISPRO (1 UNIT DIAL) 100 UNIT/ML (KWIKPEN)
6.0000 [IU] | PEN_INJECTOR | Freq: Three times a day (TID) | SUBCUTANEOUS | 5 refills | Status: AC
  Filled 2023-01-23 – 2023-02-28 (×2): qty 15, 34d supply, fill #0

## 2023-01-23 NOTE — Telephone Encounter (Signed)
 Changed rx to humalog and sent to [facility] for [facility]

## 2023-01-25 ENCOUNTER — Other Ambulatory Visit (HOSPITAL_COMMUNITY): Payer: Self-pay

## 2023-01-25 NOTE — Progress Notes (Addendum)
 Pharmacy Medication Assistance Program Note    02/10/2023  Patient ID: Mandy Mullen, female   DOB: Oct 07, 1950, 73 y.o.   MRN: [id number]     12/13/2022  Outreach Medication One  Manufacturer Medication One Novo Nordisk  [employer] [facility] Novolog   Type of Assistance Manufacturer Assistance  Date Application Sent to Patient 12/13/2022  Date Application Sent to Prescriber 01/25/2023  Name of Prescriber [place] [employer]  Date Application Received From Patient 01/24/2023  Application Items Received From Patient Application  Date Application Received From Provider 02/07/2023  Date Application Submitted to Manufacturer 02/07/2023  Method Application Sent to Manufacturer Fax  Patient Assistance Determination Approved  Approval Start Date 02/09/2023  Approval End Date 02/03/2024

## 2023-02-02 ENCOUNTER — Other Ambulatory Visit: Payer: Self-pay

## 2023-02-07 ENCOUNTER — Other Ambulatory Visit (HOSPITAL_COMMUNITY): Payer: Self-pay

## 2023-02-28 ENCOUNTER — Other Ambulatory Visit: Payer: Self-pay

## 2023-02-28 ENCOUNTER — Telehealth: Payer: Self-pay

## 2023-02-28 ENCOUNTER — Other Ambulatory Visit (HOSPITAL_COMMUNITY): Payer: Self-pay

## 2023-02-28 NOTE — Telephone Encounter (Signed)
Left message informing patient her insulin is ready for pickup here at the office.  4 boxes of Novolog Flexpen are labeled and ready in med room fridge (4 month suply)

## 2023-04-04 NOTE — Telephone Encounter (Signed)
 Left second message regarding med ready for pickup.

## 2023-04-19 NOTE — Telephone Encounter (Addendum)
 Medication moved to sample stock. Never picked up.  No room for inventory.

## 2023-05-10 ENCOUNTER — Other Ambulatory Visit: Payer: Self-pay

## 2023-12-04 ENCOUNTER — Ambulatory Visit: Payer: Self-pay | Admitting: Internal Medicine
